# Patient Record
Sex: Male | Born: 1955 | Race: White | Hispanic: No | Marital: Married | Smoking: Current every day smoker
Health system: Southern US, Community
[De-identification: ages and names within clinical notes are randomized; demographics above are authoritative.]

## PROBLEM LIST (undated history)

## (undated) DIAGNOSIS — Z8719 Personal history of other diseases of the digestive system: Secondary | ICD-10-CM

## (undated) DIAGNOSIS — G2581 Restless legs syndrome: Secondary | ICD-10-CM

## (undated) DIAGNOSIS — I10 Essential (primary) hypertension: Secondary | ICD-10-CM

## (undated) DIAGNOSIS — I639 Cerebral infarction, unspecified: Secondary | ICD-10-CM

## (undated) DIAGNOSIS — R030 Elevated blood-pressure reading, without diagnosis of hypertension: Secondary | ICD-10-CM

## (undated) DIAGNOSIS — G8929 Other chronic pain: Principal | ICD-10-CM

## (undated) DIAGNOSIS — R072 Precordial pain: Secondary | ICD-10-CM

## (undated) DIAGNOSIS — E785 Hyperlipidemia, unspecified: Secondary | ICD-10-CM

## (undated) DIAGNOSIS — I6381 Other cerebral infarction due to occlusion or stenosis of small artery: Principal | ICD-10-CM

## (undated) DIAGNOSIS — B1920 Unspecified viral hepatitis C without hepatic coma: Secondary | ICD-10-CM

## (undated) DIAGNOSIS — Z800 Family history of malignant neoplasm of digestive organs: Principal | ICD-10-CM

## (undated) DIAGNOSIS — R29898 Other symptoms and signs involving the musculoskeletal system: Secondary | ICD-10-CM

## (undated) DIAGNOSIS — B182 Chronic viral hepatitis C: Principal | ICD-10-CM

## (undated) DIAGNOSIS — M79671 Pain in right foot: Secondary | ICD-10-CM

## (undated) DIAGNOSIS — R9389 Abnormal findings on diagnostic imaging of other specified body structures: Secondary | ICD-10-CM

## (undated) DIAGNOSIS — M25512 Pain in left shoulder: Secondary | ICD-10-CM

## (undated) DIAGNOSIS — M79672 Pain in left foot: Secondary | ICD-10-CM

## (undated) DIAGNOSIS — D509 Iron deficiency anemia, unspecified: Secondary | ICD-10-CM

## (undated) HISTORY — DX: Essential (primary) hypertension: I10

## (undated) HISTORY — DX: Elevated blood-pressure reading, without diagnosis of hypertension: R03.0

## (undated) HISTORY — PX: BACK SURGERY: SHX140

## (undated) HISTORY — PX: TONSILLECTOMY: SUR1361

---

## 1999-03-20 ENCOUNTER — Ambulatory Visit (HOSPITAL_COMMUNITY): Admission: RE | Admit: 1999-03-20 | Discharge: 1999-03-20 | Payer: Self-pay | Admitting: Gastroenterology

## 2000-12-27 ENCOUNTER — Ambulatory Visit (HOSPITAL_COMMUNITY): Admission: RE | Admit: 2000-12-27 | Discharge: 2000-12-27 | Payer: Self-pay | Admitting: Neurological Surgery

## 2000-12-27 ENCOUNTER — Encounter: Payer: Self-pay | Admitting: Neurological Surgery

## 2001-03-23 ENCOUNTER — Encounter: Admission: RE | Admit: 2001-03-23 | Discharge: 2001-03-23 | Payer: Self-pay | Admitting: *Deleted

## 2001-03-23 ENCOUNTER — Encounter: Payer: Self-pay | Admitting: *Deleted

## 2001-05-21 ENCOUNTER — Emergency Department: Admit: 2001-05-21 | Payer: Self-pay | Source: Emergency Department | Admitting: Emergency Medicine

## 2002-11-04 ENCOUNTER — Inpatient Hospital Stay
Admission: EM | Admit: 2002-11-04 | Disposition: A | Discharge status: Home / Self Care | Payer: Self-pay | Source: Emergency Department | Admitting: Internal Medicine

## 2003-08-06 ENCOUNTER — Emergency Department: Admit: 2003-08-06 | Payer: Self-pay | Source: Emergency Department | Admitting: Emergency Medicine

## 2003-09-26 ENCOUNTER — Emergency Department: Admit: 2003-09-26 | Payer: Self-pay | Source: Emergency Department | Admitting: Emergency Medicine

## 2003-11-24 ENCOUNTER — Encounter: Admission: RE | Admit: 2003-11-24 | Discharge: 2003-11-24 | Payer: Self-pay | Admitting: Family Medicine

## 2004-01-16 ENCOUNTER — Encounter: Admission: RE | Admit: 2004-01-16 | Discharge: 2004-01-16 | Payer: Self-pay | Admitting: Family Medicine

## 2004-01-16 ENCOUNTER — Ambulatory Visit (INDEPENDENT_AMBULATORY_CARE_PROVIDER_SITE_OTHER): Admit: 2004-01-16 | Disposition: A | Discharge status: Home / Self Care | Payer: Self-pay | Source: Ambulatory Visit

## 2004-06-16 ENCOUNTER — Encounter: Admission: RE | Admit: 2004-06-16 | Discharge: 2004-06-16 | Payer: Self-pay | Admitting: Family Medicine

## 2004-08-23 ENCOUNTER — Emergency Department: Admit: 2004-08-23 | Payer: Self-pay | Source: Emergency Department | Admitting: Emergency Medicine

## 2004-12-19 ENCOUNTER — Ambulatory Visit (HOSPITAL_BASED_OUTPATIENT_CLINIC_OR_DEPARTMENT_OTHER): Admission: RE | Admit: 2004-12-19 | Discharge: 2004-12-19 | Payer: Self-pay | Admitting: Family Medicine

## 2004-12-23 ENCOUNTER — Ambulatory Visit: Payer: Self-pay | Admitting: Internal Medicine

## 2005-01-27 ENCOUNTER — Ambulatory Visit (HOSPITAL_BASED_OUTPATIENT_CLINIC_OR_DEPARTMENT_OTHER): Admission: RE | Admit: 2005-01-27 | Discharge: 2005-01-27 | Payer: Self-pay | Admitting: Family Medicine

## 2005-02-04 ENCOUNTER — Ambulatory Visit: Payer: Self-pay | Admitting: Internal Medicine

## 2007-03-07 ENCOUNTER — Emergency Department: Admit: 2007-03-07 | Payer: Self-pay | Source: Emergency Department | Admitting: Pediatric Emergency Medicine

## 2007-07-31 ENCOUNTER — Ambulatory Visit (HOSPITAL_BASED_OUTPATIENT_CLINIC_OR_DEPARTMENT_OTHER): Admission: RE | Admit: 2007-07-31 | Discharge: 2007-07-31 | Payer: Self-pay | Admitting: Urology

## 2008-10-27 ENCOUNTER — Encounter: Admission: RE | Admit: 2008-10-27 | Discharge: 2008-10-27 | Payer: Self-pay | Admitting: Family Medicine

## 2009-06-07 ENCOUNTER — Emergency Department: Admit: 2009-06-07 | Payer: Self-pay | Source: Emergency Department | Admitting: Emergency Medicine

## 2009-06-12 ENCOUNTER — Emergency Department: Admit: 2009-06-12 | Payer: Self-pay | Source: Emergency Department

## 2009-06-21 ENCOUNTER — Encounter: Admission: RE | Admit: 2009-06-21 | Discharge: 2009-06-21 | Payer: Self-pay | Admitting: Family Medicine

## 2009-07-13 ENCOUNTER — Encounter
Admission: RE | Admit: 2009-07-13 | Discharge: 2009-09-13 | Payer: Self-pay | Admitting: Physical Medicine & Rehabilitation

## 2009-08-28 ENCOUNTER — Ambulatory Visit: Payer: Self-pay | Admitting: Physical Medicine & Rehabilitation

## 2009-10-06 ENCOUNTER — Encounter
Admission: RE | Admit: 2009-10-06 | Discharge: 2009-12-26 | Payer: Self-pay | Admitting: Physical Medicine & Rehabilitation

## 2009-10-10 ENCOUNTER — Ambulatory Visit: Payer: Self-pay | Admitting: Physical Medicine & Rehabilitation

## 2009-10-31 ENCOUNTER — Ambulatory Visit: Payer: Self-pay | Admitting: Physical Medicine & Rehabilitation

## 2009-12-26 ENCOUNTER — Ambulatory Visit: Payer: Self-pay | Admitting: Physical Medicine & Rehabilitation

## 2010-05-10 ENCOUNTER — Inpatient Hospital Stay (HOSPITAL_COMMUNITY): Admission: EM | Admit: 2010-05-10 | Discharge: 2009-07-12 | Payer: Self-pay | Admitting: Emergency Medicine

## 2010-08-23 LAB — BASIC METABOLIC PANEL
BUN: 13 mg/dL (ref 6–23)
CO2: 28 mEq/L (ref 19–32)
Calcium: 8.3 mg/dL — ABNORMAL LOW (ref 8.4–10.5)
Chloride: 105 mEq/L (ref 96–112)
Chloride: 106 mEq/L (ref 96–112)
Creatinine, Ser: 0.96 mg/dL (ref 0.4–1.5)
GFR calc Af Amer: >60 mL/min (ref >60)
GFR calc non Af Amer: >60 mL/min (ref >60)
Glucose, Bld: 144 mg/dL — ABNORMAL HIGH (ref 70–99)
Potassium: 3.7 mEq/L (ref 3.5–5.1)
Potassium: 3.8 mEq/L (ref 3.5–5.1)
Potassium: 4.1 mEq/L (ref 3.5–5.1)
Sodium: 139 mEq/L (ref 135–145)
Sodium: 139 mEq/L (ref 135–145)

## 2010-08-23 LAB — CBC
HCT: 41.1 % (ref 39.0–52.0)
HCT: 42.5 % (ref 39.0–52.0)
Hemoglobin: 14.2 g/dL (ref 13.0–17.0)
Hemoglobin: 14.7 g/dL (ref 13.0–17.0)
MCHC: 34.6 g/dL (ref 30.0–36.0)
MCV: 97 fL (ref 78.0–100.0)
Platelets: 169 10*3/uL (ref 150–400)
Platelets: 209 10*3/uL (ref 150–400)
RBC: 4.4 MIL/uL (ref 4.22–5.81)
RDW: 13 % (ref 11.5–15.5)
RDW: 13.3 % (ref 11.5–15.5)
WBC: 11 10*3/uL — ABNORMAL HIGH (ref 4.0–10.5)
WBC: 7.9 10*3/uL (ref 4.0–10.5)

## 2010-10-16 NOTE — Op Note (Signed)
NAMEBRECKEN, Henry Rangel                ACCOUNT NO.:  [id number]   MEDICAL RECORD NO.:  [id number]          PATIENT TYPE:  AMB   LOCATION:  [facility]                         FACILITY:  [facility]   PHYSICIAN:  Boston Service, M.D.DATE OF BIRTH:  02-06-1956   DATE OF PROCEDURE:  07/31/2007  DATE OF DISCHARGE:                               OPERATIVE REPORT   PREOPERATIVE DIAGNOSES:  A 55 year old smoker, total gross painless  hematuria.  Previous workup:  CT scan July 15, 2007. Reviewed office  note from that same date. Rectal exam nontender,  nonnodular, PSA  0.5.   POSTOPERATIVE DIAGNOSES:  Same.   PROCEDURE:  Cystoscopy and retrogrades.   ANESTHESIA:  General.   DRAINS:  None.   COMPLICATIONS:  None.   SURGEON:  Dr. Boston Service.   SPECIMENS:  None.   ESTIMATED BLOOD LOSS:  Minimal.   DESCRIPTION OF PROCEDURE:  The patient was prepped and draped in the  dorsal lithotomy position after institution of an adequate level of  general anesthesia. Narrow fossa navicularis dilated with Sissy Hoff  sounds to a #26-French, a  well lubricated 21-French panendoscope gently  inserted at the urethral meatus, normal urethra and sphincter,  nonobstructive prostate.  Careful inspection of the bladder showed clear  efflux at both right and left ureteral orifices.  An area of slight  bladder wall thickening on the CT scan was shown by cystoscopy to be  trabeculation but there was no evidence of tumor, stone, bleeding site  or other anatomic abnormality.  Careful inspection of the bladder with  the 15 and 70 degrees lenses was completed.   Blocking catheter was selected, retrogrades were performed.  Normal  course and caliber of the ureter, pelvis and calyces with prompt  drainage at 3-5 minutes.  The bladder was drained, cystoscope was  removed.  The patient was given a B&O suppository and returned to  recovery in satisfactory condition.   ADDENDUM:  The patient had been offered office  cystoscopy, but refused.           ______________________________  Boston Service, M.D.     RH/MEDQ  D:  07/31/2007  T:  08/01/2007  Job:  [id number]   cc:   Winn Jock. Lovenia Kim., MD  [place], [place]

## 2010-10-19 NOTE — Procedures (Signed)
[name], Henry Rangel                ACCOUNT NO.:  [id number]   MEDICAL RECORD NO.:  [id number]          PATIENT TYPE:  OUT   LOCATION:  SLEEP CENTER                 FACILITY:  [facility]   PHYSICIAN:  Clinton D. Maple Hudson, M.D. DATE OF BIRTH:  1955-10-17   DATE OF STUDY:  12/19/2004                              NOCTURNAL POLYSOMNOGRAM   REFERRING PHYSICIAN:  Talmadge Coventry, M.D.   INDICATION FOR STUDY:  Hypersomnia with sleep apnea.   EPWORTH SLEEPINESS SCORE:  4/24   BMI:  34.9   WEIGHT:  258 pounds   SLEEP ARCHITECTURE:  Total sleep time 289 minutes.  Sleep efficiency 87%.  Stage I 7%, stage II 72%, stages III and IV 10 %.  REM was 12% of total  sleep time.  Sleep latency 33 minutes.  REM latency 80 minutes, awake after  sleep onset 47 minutes.  Arousal index 35.  No bedtime medication reported.   RESPIRATORY DATA:  Respiratory disturbance index  (RDI, AHI) 12.9  obstructive events per hour, indicating mild obstructive sleep  apnea/hypopnea syndrome.  There was one obstructive apnea and 61 hypopneas.  Most events were recorded while sleeping supine.  REM RDI 3.6.  He had  insufficient early events to prevent use of CPAP titration by split protocol  on the study night.   OXYGEN DATA:  Moderate snoring with oxygen desaturation to a nadir of 87%.  Mean oxygen saturation to the study was 93% on room air.   CARDIAC DATA:  Sinus rhythm and sinus bradycardia 58-78 per minute without  ectopics.   MOVEMENT/PARASOMNIA:  A total of 172 limb jerks were reported, of which 17  were associated with arousal or awakening for a periodic limb movement with  arousal index of 3.5 per hour, which is mildly increased.   IMPRESSION/RECOMMENDATION:  1.  Mild obstructive sleep apnea/hypopnea syndrome, respiratory disturbance      index  12.9 per hour with moderate snoring and oxygen desaturation to      87%.  2.  Scores in this range may qualify for CPAP titration, but there is      documentation of  symptoms and/or      complications of sleep apnea, including excessive daytime somnolence or      hypertension.  3.  Periodic limb movements with arousal, 3.5 per hour.      Clinton D. Maple Hudson, M.D.  Diplomat    CDY/MEDQ  D:  12/23/2004 12:33:23  T:  12/24/2004 08:16:31  Job:  [id number]

## 2010-10-19 NOTE — Procedures (Signed)
NAMEMEDARD, DECUIR                ACCOUNT NO.:  [id number]   MEDICAL RECORD NO.:  [id number]          PATIENT TYPE:  OUT   LOCATION:  SLEEP CENTER                 FACILITY:  [facility]   PHYSICIAN:  Clinton D. Maple Hudson, M.D. DATE OF BIRTH:  Apr 16, 1956   DATE OF STUDY:  01/27/2005                              NOCTURNAL POLYSOMNOGRAM   REFERRING PHYSICIAN:  Dr. Rise Mu Mazzocchi   DATE OF STUDY:  January 27, 2005   INDICATION FOR STUDY:  Hypersomnia with sleep apnea. Epworth Sleepiness  Score 5/24, BMI 35, weight 258 pounds. A baseline diagnostic NPSG December 19, 2004 recorded an RDI of 12.9 per hour. CPAP titration is requested.   SLEEP ARCHITECTURE:  Very short total sleep time 194 minutes with sleep  efficiency 54%. Stage I was 20%, stage II 67%, stage III and IV 9%, REM 4%  of total sleep time. Sleep latency 19 minutes, REM latency 214 minutes,  awake after sleep onset 108 minutes, arousal index increased at 55. No  bedtime medication recorded except for blood pressure pill.   RESPIRATORY DATA:  CPAP titration protocol. CPAP was titrated to 5 CWP. The  technician worked with the patient, trying a number of different masks and  finally switched to BiPAP at a final pressure of inspiratory 9/expiratory 6  CWP. It appears the final appliance was a [employer] with medium nasal  pillows and heated humidifier.   OXYGEN DATA:  Snoring was minimal at final pressure with oxygenation 95-98%  on room air.   CARDIAC DATA:  Normal sinus rhythm.   MOVEMENT/PARASOMNIA:  A total of 318 limb jerks were recorded of which 120  were associated with arousal or awakening for a periodic limb movement with  arousal index of 37 per hour which is increased.   IMPRESSION/RECOMMENDATION:  1.  Difficult titration because of patient discomfort with masks. Final      settings were with BiPAP inspiratory 9/expiratory 6 CWP using a ResMed      Swift with medium nasal pillows and heated humidifier.  2.  This  patient will probably benefit from a sleep medication during      initial adjustment at home. Once comfortable with positive pressure he      may find he can switch satisfactorily back to continuous positive airway      pressure at a suggested initial pressure of 9 CWP.  3.  Periodic limb movement with arousal, 37 per hour. This is significant      and may represent an additional sleep disorder requiring specific      therapy but it can be reconsidered after treatment for sleep apnea.      Clinton D. Maple Hudson, M.D.  Diplomate, Biomedical engineer of Sleep Medicine  Electronically Signed     CDY/MEDQ  D:  02/03/2005 11:35:36  T:  02/03/2005 14:15:05  Job:  [id number]

## 2011-02-22 LAB — POCT I-STAT, CHEM 8
BUN: 12
Chloride: 105
Creatinine, Ser: 0.9
Glucose, Bld: 101 — ABNORMAL HIGH
Hemoglobin: 16.7
Potassium: 4.1
TCO2: 27

## 2011-03-12 ENCOUNTER — Ambulatory Visit: Payer: Worker's Compensation | Admitting: Physical Medicine & Rehabilitation

## 2011-03-12 ENCOUNTER — Encounter: Payer: Worker's Compensation | Attending: Physical Medicine & Rehabilitation

## 2011-03-12 DIAGNOSIS — M549 Dorsalgia, unspecified: Secondary | ICD-10-CM | POA: Insufficient documentation

## 2011-03-12 DIAGNOSIS — R209 Unspecified disturbances of skin sensation: Secondary | ICD-10-CM | POA: Insufficient documentation

## 2011-03-12 DIAGNOSIS — E1149 Type 2 diabetes mellitus with other diabetic neurological complication: Secondary | ICD-10-CM | POA: Insufficient documentation

## 2011-03-12 DIAGNOSIS — M51379 Other intervertebral disc degeneration, lumbosacral region without mention of lumbar back pain or lower extremity pain: Secondary | ICD-10-CM | POA: Insufficient documentation

## 2011-03-12 DIAGNOSIS — E1142 Type 2 diabetes mellitus with diabetic polyneuropathy: Secondary | ICD-10-CM

## 2011-03-12 DIAGNOSIS — M48061 Spinal stenosis, lumbar region without neurogenic claudication: Secondary | ICD-10-CM

## 2011-03-12 DIAGNOSIS — M5137 Other intervertebral disc degeneration, lumbosacral region: Secondary | ICD-10-CM | POA: Insufficient documentation

## 2011-03-13 LAB — ECG 12-LEAD
Atrial Rate: 90 {beats}/min
P Axis: 82 degrees
P-R Interval: 138 ms
Q-T Interval: 354 ms
QRS Duration: 74 ms
QTC Calculation (Bezet): 433 ms
R Axis: 257 degrees
T Axis: 39 degrees
Ventricular Rate: 90 {beats}/min

## 2011-06-28 ENCOUNTER — Encounter (HOSPITAL_COMMUNITY): Payer: Self-pay | Admitting: Emergency Medicine

## 2011-06-28 ENCOUNTER — Emergency Department (HOSPITAL_COMMUNITY): Payer: Medicare Other

## 2011-06-28 ENCOUNTER — Emergency Department (HOSPITAL_COMMUNITY)
Admission: EM | Admit: 2011-06-28 | Discharge: 2011-06-28 | Disposition: A | Discharge status: Home / Self Care | Payer: Medicare Other | Attending: Emergency Medicine | Admitting: Emergency Medicine

## 2011-06-28 DIAGNOSIS — R079 Chest pain, unspecified: Secondary | ICD-10-CM | POA: Insufficient documentation

## 2011-06-28 DIAGNOSIS — R05 Cough: Secondary | ICD-10-CM | POA: Insufficient documentation

## 2011-06-28 DIAGNOSIS — Z79899 Other long term (current) drug therapy: Secondary | ICD-10-CM | POA: Insufficient documentation

## 2011-06-28 DIAGNOSIS — R059 Cough, unspecified: Secondary | ICD-10-CM | POA: Insufficient documentation

## 2011-06-28 DIAGNOSIS — I10 Essential (primary) hypertension: Secondary | ICD-10-CM | POA: Insufficient documentation

## 2011-06-28 DIAGNOSIS — IMO0002 Reserved for concepts with insufficient information to code with codable children: Secondary | ICD-10-CM | POA: Insufficient documentation

## 2011-06-28 DIAGNOSIS — S29011A Strain of muscle and tendon of front wall of thorax, initial encounter: Secondary | ICD-10-CM

## 2011-06-28 DIAGNOSIS — X500XXA Overexertion from strenuous movement or load, initial encounter: Secondary | ICD-10-CM | POA: Insufficient documentation

## 2011-06-28 DIAGNOSIS — R0602 Shortness of breath: Secondary | ICD-10-CM | POA: Insufficient documentation

## 2011-06-28 DIAGNOSIS — F17200 Nicotine dependence, unspecified, uncomplicated: Secondary | ICD-10-CM | POA: Insufficient documentation

## 2011-06-28 DIAGNOSIS — R109 Unspecified abdominal pain: Secondary | ICD-10-CM | POA: Insufficient documentation

## 2011-06-28 DIAGNOSIS — Z7982 Long term (current) use of aspirin: Secondary | ICD-10-CM | POA: Insufficient documentation

## 2011-06-28 HISTORY — DX: Restless legs syndrome: G25.81

## 2011-06-28 HISTORY — DX: Essential (primary) hypertension: I10

## 2011-06-28 MED ORDER — OXYCODONE HCL 5 MG PO TABS: 5.0000 mg | ORAL_TABLET | ORAL | Status: AC | PRN

## 2011-06-28 MED ORDER — HYDROCOD POLST-CHLORPHEN POLST 10-8 MG/5ML PO LQCR: 5.0000 mL | Freq: Two times a day (BID) | ORAL | Status: DC

## 2011-06-28 MED ORDER — HYDROCOD POLST-CHLORPHEN POLST 10-8 MG/5ML PO LQCR
5.0000 mL | Freq: Once | ORAL | Status: AC
  Administered 2011-06-28: 5 mL via ORAL
  Filled 2011-06-28: qty 5

## 2011-06-28 MED ORDER — OXYCODONE-ACETAMINOPHEN 5-325 MG PO TABS
2.0000 | ORAL_TABLET | Freq: Once | ORAL | Status: AC
  Administered 2011-06-28: 2 via ORAL
  Filled 2011-06-28: qty 2

## 2011-06-28 NOTE — ED Provider Notes (Signed)
Medical screening examination/treatment/procedure(s) were performed by non-physician practitioner and as supervising physician I was immediately available for consultation/collaboration.   Vida Roller, MD 06/28/11 [id number]

## 2011-06-28 NOTE — ED Notes (Signed)
Pt alert, nad, c/o right rib pain, onset this evening, pt denies trauma or injury, states pain started after coughing, resp even, shallow,  unlabored, skin pwd.

## 2011-06-28 NOTE — ED Provider Notes (Signed)
History     CSN: [id number]  Arrival date & time 06/28/11  0107   First MD Initiated Contact with Patient 06/28/11 0119      Chief Complaint  Patient presents with  . Rib Injury    Right    (Consider location/radiation/quality/duration/timing/severity/associated sxs/prior treatment) HPI Comments: Patient has been seeing his primary care physician for cough was told that he had bronchitis and maybe a touch of pneumonia.  Was given a Z-Pak steroids, which he has completed.  He is still coughing, nonproductive.  No fever.  Tonight, when coughing, he developed severe sudden, sharp, lower right chest pain that now hurts with breathing or movement  The history is provided by the patient.    Past Medical History  Diagnosis Date  . Hypertension   . Restless leg syndrome     Past Surgical History  Procedure Date  . Back surgery     No family history on file.  History  Substance Use Topics  . Smoking status: Current Everyday Smoker -- 1.0 packs/day  . Smokeless tobacco: Not on file  . Alcohol Use:       Review of Systems  Constitutional: Negative for fever and chills.  HENT: Negative for congestion and rhinorrhea.   Respiratory: Positive for cough. Negative for shortness of breath and wheezing.   Cardiovascular: Positive for chest pain.  Gastrointestinal: Negative for nausea.  Skin: Negative.   Neurological: Negative for dizziness and weakness.    Allergies  Penicillins  Home Medications   Current Outpatient Rx  Name Route Sig Dispense Refill  . ASPIRIN 81 MG PO CHEW Oral Chew 81 mg by mouth daily.    Marland Kitchen BISOPROLOL-HYDROCHLOROTHIAZIDE 2.5-6.25 MG PO TABS Oral Take 1 tablet by mouth daily.    . OMEGA-3-ACID ETHYL ESTERS 1 G PO CAPS Oral Take 2 g by mouth daily.    Marland Kitchen PRAMIPEXOLE DIHYDROCHLORIDE 1.5 MG PO TABS Oral Take 1.5 mg by mouth 3 (three) times daily.      BP 156/78  Pulse 87  Temp 98 F (36.7 C)  Resp 16  Wt 285 lb (129.275 kg)  SpO2 99%  Physical  Exam  Constitutional: He is oriented to person, place, and time. He appears well-developed and well-nourished.  HENT:  Head: Normocephalic.  Eyes: Pupils are equal, round, and reactive to light.  Neck: Normal range of motion.  Cardiovascular: Normal rate and regular rhythm.   Pulmonary/Chest: Effort normal and breath sounds normal. He has no wheezes. He has no rales. He exhibits tenderness.  Abdominal: Soft. Bowel sounds are normal. He exhibits no distension. There is tenderness.  Musculoskeletal: Normal range of motion.  Neurological: He is alert and oriented to person, place, and time.  Skin: Skin is warm and dry.  Psychiatric: He has a normal mood and affect.    ED Course  Procedures (including critical care time)  Labs Reviewed - No data to display Dg Ribs Unilateral W/chest Right  06/28/2011  *RADIOLOGY REPORT*  Clinical Data: Cough; heard pop in chest, with right anterior lower rib pain and shortness of breath.  RIGHT RIBS AND CHEST - 3+ VIEW  Comparison: Chest radiograph performed 11/24/2003  Findings: No displaced rib fractures are seen.  The lungs are relatively well expanded.  Minimal left basilar opacity likely reflects atelectasis.  No pleural effusion or pneumothorax is seen.  The cardiomediastinal silhouette is borderline normal in size.  No acute osseous abnormalities identified.  IMPRESSION:  1.  No displaced rib fractures seen. 2.  Minimal  left basilar airspace opacity likely reflects atelectasis; lungs otherwise clear.  Original Report Authenticated By: Tonia Ghent, M.D.     1. Chest wall muscle strain     Pleuritic, right-sided chest pain with cough.  Will obtain x-ray to rule out rib fracture.  While in the emergency, department, we'll administer Percocet for pain.  Monitor patient Discussed chest x-ray findings with patient and family will administer Tussionex for cough.  Discharge patient home with Tussionex hydrocodone and have patient follow up with their primary  care physician as needed  MDM  Pleuritic chest pain        Arman Filter, NP 06/28/11 0300

## 2011-07-17 ENCOUNTER — Other Ambulatory Visit: Payer: Self-pay | Admitting: Family Medicine

## 2011-07-18 ENCOUNTER — Ambulatory Visit
Admission: RE | Admit: 2011-07-18 | Discharge: 2011-07-18 | Disposition: A | Discharge status: Home / Self Care | Payer: 59 | Source: Ambulatory Visit | Attending: Family Medicine | Admitting: Family Medicine

## 2011-07-18 MED ORDER — IOHEXOL 300 MG/ML  SOLN
125.0000 mL | Freq: Once | INTRAMUSCULAR | Status: AC | PRN
  Administered 2011-07-18: 125 mL via INTRAVENOUS

## 2011-07-19 ENCOUNTER — Other Ambulatory Visit: Payer: Self-pay

## 2011-07-19 ENCOUNTER — Other Ambulatory Visit: Payer: Self-pay | Admitting: Family Medicine

## 2011-07-19 DIAGNOSIS — F17200 Nicotine dependence, unspecified, uncomplicated: Secondary | ICD-10-CM

## 2011-07-22 ENCOUNTER — Ambulatory Visit
Admission: RE | Admit: 2011-07-22 | Discharge: 2011-07-22 | Disposition: A | Discharge status: Home / Self Care | Payer: 59 | Source: Ambulatory Visit | Attending: Family Medicine | Admitting: Family Medicine

## 2011-07-22 ENCOUNTER — Other Ambulatory Visit: Payer: Self-pay | Admitting: Family Medicine

## 2011-07-22 DIAGNOSIS — M79606 Pain in leg, unspecified: Secondary | ICD-10-CM

## 2011-07-22 DIAGNOSIS — F17200 Nicotine dependence, unspecified, uncomplicated: Secondary | ICD-10-CM

## 2011-07-22 MED ORDER — IOHEXOL 300 MG/ML  SOLN
75.0000 mL | Freq: Once | INTRAMUSCULAR | Status: AC | PRN
  Administered 2011-07-22: 75 mL via INTRAVENOUS

## 2011-07-23 ENCOUNTER — Ambulatory Visit
Admission: RE | Admit: 2011-07-23 | Discharge: 2011-07-23 | Disposition: A | Discharge status: Home / Self Care | Payer: 59 | Source: Ambulatory Visit | Attending: Family Medicine | Admitting: Family Medicine

## 2011-07-23 DIAGNOSIS — M79606 Pain in leg, unspecified: Secondary | ICD-10-CM

## 2011-08-02 ENCOUNTER — Other Ambulatory Visit: Payer: Self-pay | Admitting: Family Medicine

## 2011-08-02 ENCOUNTER — Ambulatory Visit
Admission: RE | Admit: 2011-08-02 | Discharge: 2011-08-02 | Disposition: A | Discharge status: Home / Self Care | Payer: 59 | Source: Ambulatory Visit | Attending: Family Medicine | Admitting: Family Medicine

## 2011-08-02 DIAGNOSIS — M545 Low back pain: Secondary | ICD-10-CM

## 2011-08-02 MED ORDER — GADOBENATE DIMEGLUMINE 529 MG/ML IV SOLN: 20.0000 mL | Freq: Once | INTRAVENOUS | Status: AC | PRN

## 2011-08-08 ENCOUNTER — Other Ambulatory Visit (HOSPITAL_COMMUNITY): Payer: Self-pay | Admitting: Family Medicine

## 2011-08-08 DIAGNOSIS — K769 Liver disease, unspecified: Secondary | ICD-10-CM

## 2011-08-09 ENCOUNTER — Other Ambulatory Visit: Payer: Self-pay | Admitting: Radiology

## 2011-08-09 ENCOUNTER — Encounter (HOSPITAL_COMMUNITY): Payer: Self-pay | Admitting: Pharmacy Technician

## 2011-08-13 ENCOUNTER — Other Ambulatory Visit (HOSPITAL_COMMUNITY): Payer: 59

## 2011-08-13 ENCOUNTER — Other Ambulatory Visit: Payer: Self-pay | Admitting: Physician Assistant

## 2011-08-13 ENCOUNTER — Emergency Department
Admit: 2011-08-13 | Discharge: 2011-08-13 | Disposition: A | Discharge status: Home / Self Care | Payer: Self-pay | Source: Emergency Department | Admitting: Emergency Medicine

## 2011-08-15 ENCOUNTER — Inpatient Hospital Stay (HOSPITAL_COMMUNITY): Admission: RE | Admit: 2011-08-15 | Payer: 59 | Source: Ambulatory Visit

## 2011-08-15 LAB — ECG 12-LEAD
Atrial Rate: 100 {beats}/min
Atrial Rate: 61 {beats}/min
P Axis: 84 degrees
P-R Interval: 138 ms
Q-T Interval: 224 ms
Q-T Interval: 342 ms
QRS Duration: 78 ms
QRS Duration: 80 ms
QTC Calculation (Bezet): 417 ms
QTC Calculation (Bezet): 441 ms
R Axis: -78 degrees
R Axis: 74 degrees
T Axis: 18 degrees
T Axis: 9 degrees
Ventricular Rate: 100 {beats}/min
Ventricular Rate: 209 {beats}/min

## 2011-08-16 ENCOUNTER — Encounter (HOSPITAL_COMMUNITY): Payer: Self-pay

## 2011-08-16 ENCOUNTER — Other Ambulatory Visit (HOSPITAL_COMMUNITY): Payer: Self-pay | Admitting: Family Medicine

## 2011-08-16 ENCOUNTER — Ambulatory Visit (HOSPITAL_COMMUNITY)
Admission: RE | Admit: 2011-08-16 | Discharge: 2011-08-16 | Disposition: A | Discharge status: Home / Self Care | Payer: Medicare Other | Source: Ambulatory Visit | Attending: Family Medicine | Admitting: Family Medicine

## 2011-08-16 DIAGNOSIS — J449 Chronic obstructive pulmonary disease, unspecified: Secondary | ICD-10-CM | POA: Insufficient documentation

## 2011-08-16 DIAGNOSIS — R7309 Other abnormal glucose: Secondary | ICD-10-CM | POA: Insufficient documentation

## 2011-08-16 DIAGNOSIS — I10 Essential (primary) hypertension: Secondary | ICD-10-CM | POA: Insufficient documentation

## 2011-08-16 DIAGNOSIS — Z79899 Other long term (current) drug therapy: Secondary | ICD-10-CM | POA: Insufficient documentation

## 2011-08-16 DIAGNOSIS — G4733 Obstructive sleep apnea (adult) (pediatric): Secondary | ICD-10-CM | POA: Insufficient documentation

## 2011-08-16 DIAGNOSIS — K7689 Other specified diseases of liver: Secondary | ICD-10-CM | POA: Insufficient documentation

## 2011-08-16 DIAGNOSIS — Z85828 Personal history of other malignant neoplasm of skin: Secondary | ICD-10-CM | POA: Insufficient documentation

## 2011-08-16 DIAGNOSIS — K769 Liver disease, unspecified: Secondary | ICD-10-CM

## 2011-08-16 DIAGNOSIS — J4489 Other specified chronic obstructive pulmonary disease: Secondary | ICD-10-CM | POA: Insufficient documentation

## 2011-08-16 DIAGNOSIS — G2581 Restless legs syndrome: Secondary | ICD-10-CM | POA: Insufficient documentation

## 2011-08-16 HISTORY — DX: Personal history of other diseases of the digestive system: Z87.19

## 2011-08-16 LAB — CBC
MCH: 32.9 pg (ref 26.0–34.0)
Platelets: 186 10*3/uL (ref 150–400)
RBC: 4.8 MIL/uL (ref 4.22–5.81)
RDW: 13.3 % (ref 11.5–15.5)

## 2011-08-16 LAB — PROTIME-INR: Prothrombin Time: 13 seconds (ref 11.6–15.2)

## 2011-08-16 MED ORDER — FENTANYL CITRATE 0.05 MG/ML IJ SOLN
INTRAMUSCULAR | Status: AC | PRN
  Administered 2011-08-16 (×2): 100 ug via INTRAVENOUS

## 2011-08-16 MED ORDER — MIDAZOLAM HCL 2 MG/2ML IJ SOLN
INTRAMUSCULAR | Status: AC
  Filled 2011-08-16: qty 2

## 2011-08-16 MED ORDER — MIDAZOLAM HCL 5 MG/5ML IJ SOLN
INTRAMUSCULAR | Status: AC | PRN
  Administered 2011-08-16 (×2): 2 mg via INTRAVENOUS

## 2011-08-16 MED ORDER — FENTANYL CITRATE 0.05 MG/ML IJ SOLN
INTRAMUSCULAR | Status: AC
  Filled 2011-08-16: qty 4

## 2011-08-16 MED ORDER — MIDAZOLAM HCL 2 MG/2ML IJ SOLN
INTRAMUSCULAR | Status: AC
  Filled 2011-08-16: qty 4

## 2011-08-16 MED ORDER — FENTANYL CITRATE 0.05 MG/ML IJ SOLN
INTRAMUSCULAR | Status: DC | PRN
  Administered 2011-08-16: 100 ug via INTRAVENOUS

## 2011-08-16 MED ORDER — HYDROCODONE-ACETAMINOPHEN 10-325 MG PO TABS: 1.0000 | ORAL_TABLET | ORAL | Status: DC | PRN

## 2011-08-16 MED ORDER — SODIUM CHLORIDE 0.9 % IV SOLN
Freq: Once | INTRAVENOUS | Status: AC
  Administered 2011-08-16: 13:00:00 via INTRAVENOUS

## 2011-08-16 NOTE — H&P (Signed)
Agree 

## 2011-08-16 NOTE — Procedures (Signed)
Procedure:  CT guided liver biopsy Findings:  Multiple lesions in liver not visualized at all by ultrasound.  Visible by unenhanced CT.  Core bx of right lobe liver lesion performed under CT guidance.  18 G core samples x 3 via 17 G needle.  No immediate complications Plan:  3 hour recovery/bedrest

## 2011-08-16 NOTE — Discharge Instructions (Signed)
Liver Biopsy Care After Refer to this sheet in the next few weeks. These discharge instructions provide you with general information on caring for yourself after you leave the hospital. Your caregiver may also give you specific instructions. Your treatment has been planned according to the most current medical practices available, but unavoidable complications sometimes occur. If you have any problems or questions after discharge, please call your caregiver. HOME CARE INSTRUCTIONS   You should rest for 1 to 2 days or as instructed.   If you go home the same day as your procedure (outpatient), have a responsible adult take you home and stay with you overnight.   Do not lift more than 5 pounds or play contact sports for 2 weeks.   Do not drive for 24 hours after this test.   Do not take medicine containing aspirin or drink alcohol for 1 week after this test.   Change bandages (dressings) as directed.   Only take over-the-counter or prescription medicines for pain, discomfort, or fever as directed by your caregiver.  OBTAINING YOUR TEST RESULTS Not all test results are available during your visit. If your test results are not back during the visit, make an appointment with your caregiver to find out the results. Do not assume everything is normal if you have not heard from your caregiver or the medical facility. It is important for you to follow up on all of your test results. SEEK MEDICAL CARE IF:   You have increased bleeding (more than a small spot) from the biopsy site.   You have redness, swelling, or increasing pain in the biopsy site.   You have an oral temperature above 102 F (38.9 C).  SEEK IMMEDIATE MEDICAL CARE IF:   You develop swelling or pain in the belly (abdomen).   You develop a rash.   You have difficulty breathing, feel short of breath, or feel faint.   You develop any reaction or side effects to medicines given.  MAKE SURE YOU:   Understand these instructions.    Will watch your condition.   Will get help right away if you are not doing well or get worse.  Document Released: 12/07/2004 Document Revised: 05/09/2011 Document Reviewed: 12/31/2007 ExitCare Patient Information 2012 [employer]. 

## 2011-08-16 NOTE — H&P (Signed)
Henry BAYLESS Sr. is an 56 y.o. male.   Chief Complaint: liver lesions HPI: Patient with recent history of RLQ/right groin pain and follow up CT scan revealing multiple liver lesions presents today for elective US guided liver lesion biopsy.  Past Medical History  Diagnosis Date  . Hypertension   . Restless leg syndrome   OSA,asthma/COPD, skin cancer, borderline DM  Past Surgical History  Procedure Date  . Back surgery   tonsillectomy  No family history on file. Social History:  reports that he has been smoking.  He does not have any smokeless tobacco history on file. His alcohol and drug histories not on file.  Allergies:  Allergies  Allergen Reactions  . Penicillins Other (See Comments)    Had when was little    Medications Prior to Admission  Medication Sig Dispense Refill  . albuterol (PROVENTIL HFA;VENTOLIN HFA) 108 (90 BASE) MCG/ACT inhaler Inhale 2 puffs into the lungs every 6 (six) hours as needed. Wheezing      . aspirin 81 MG chewable tablet Chew 81 mg by mouth every morning.       . bisoprolol-hydrochlorothiazide (ZIAC) 2.5-6.25 MG per tablet Take 1 tablet by mouth every morning.       . chlorpheniramine-HYDROcodone (TUSSIONEX) 10-8 MG/5ML LQCR Take 5 mLs by mouth every 12 (twelve) hours as needed. Cough      . omega-3 acid ethyl esters (LOVAZA) 1 G capsule Take 2 g by mouth daily.      . pramipexole (MIRAPEX) 1.5 MG tablet Take 1.5 mg by mouth 3 (three) times daily.       Medications Prior to Admission  Medication Dose Route Frequency Provider Last Rate Last Dose  . 0.9 %  sodium chloride infusion   Intravenous Once Robet Leu, PA        Results for orders placed during the hospital encounter of 08/16/11  CBC      Component Value Range   WBC 8.3  4.0 - 10.5 (K/uL)   RBC 4.80  4.22 - 5.81 (MIL/uL)   Hemoglobin 15.8  13.0 - 17.0 (g/dL)   HCT 16.1  09.6 - 04.5 (%)   MCV 95.0  78.0 - 100.0 (fL)   MCH 32.9  26.0 - 34.0 (pg)   MCHC 34.6  30.0 - 36.0 (g/dL)     RDW 40.9  81.1 - 91.4 (%)   Platelets 186  150 - 400 (K/uL)   Results for orders placed during the hospital encounter of 08/16/11  APTT      Component Value Range   aPTT 24  24 - 37 (seconds)  CBC      Component Value Range   WBC 8.3  4.0 - 10.5 (K/uL)   RBC 4.80  4.22 - 5.81 (MIL/uL)   Hemoglobin 15.8  13.0 - 17.0 (g/dL)   HCT 78.2  95.6 - 21.3 (%)   MCV 95.0  78.0 - 100.0 (fL)   MCH 32.9  26.0 - 34.0 (pg)   MCHC 34.6  30.0 - 36.0 (g/dL)   RDW 08.6  57.8 - 46.9 (%)   Platelets 186  150 - 400 (K/uL)  PROTIME-INR      Component Value Range   Prothrombin Time 13.0  11.6 - 15.2 (seconds)   INR 0.96  0.00 - 1.49      Review of Systems  Constitutional: Negative for fever and chills.  Respiratory: Positive for shortness of breath.   Cardiovascular: Negative for chest pain.  Gastrointestinal: Positive for  abdominal pain. Negative for nausea and vomiting.       Intermittent RLQ/hip pain  Neurological: Negative for headaches.  Endo/Heme/Allergies: Does not bruise/bleed easily.    Blood pressure 160/95, pulse 80, temperature 98.3 F (36.8 C), temperature source Oral, resp. rate 18, height 5\' 11"  (1.803 m), weight 282 lb (127.914 kg), SpO2 96.00%. Physical Exam  Constitutional: He is oriented to person, place, and time. He appears well-developed and well-nourished.  Cardiovascular: Normal rate and regular rhythm.   Respiratory:       Distant BS  GI: Soft. Bowel sounds are normal.       Mild RLQ/ hip discomfort  Musculoskeletal: Normal range of motion. He exhibits edema.  Neurological: He is alert and oriented to person, place, and time.     Assessment/Plan Patient with history of abdominal pain and multiple liver lesions noted on recent CT; plan is for US guided liver lesion biopsy.  Cielle Aguila,D KEVIN 08/16/2011, 12:31 PM

## 2012-12-16 ENCOUNTER — Emergency Department: Payer: Medicaid Other

## 2012-12-16 ENCOUNTER — Emergency Department
Admission: EM | Admit: 2012-12-16 | Discharge: 2012-12-16 | Disposition: A | Discharge status: Home / Self Care | Payer: Medicaid Other | Attending: Emergency Medicine | Admitting: Emergency Medicine

## 2012-12-16 DIAGNOSIS — W230XXA Caught, crushed, jammed, or pinched between moving objects, initial encounter: Secondary | ICD-10-CM | POA: Insufficient documentation

## 2012-12-16 DIAGNOSIS — IMO0002 Reserved for concepts with insufficient information to code with codable children: Secondary | ICD-10-CM | POA: Insufficient documentation

## 2012-12-16 DIAGNOSIS — S62609A Fracture of unspecified phalanx of unspecified finger, initial encounter for closed fracture: Secondary | ICD-10-CM | POA: Insufficient documentation

## 2012-12-16 MED ORDER — IBUPROFEN 600 MG PO TABS
600.0000 mg | ORAL_TABLET | Freq: Four times a day (QID) | ORAL | Status: DC | PRN
Start: 2012-12-16 — End: 2013-04-15

## 2012-12-16 MED ORDER — OXYCODONE-ACETAMINOPHEN 5-325 MG PO TABS
1.0000 | ORAL_TABLET | Freq: Once | ORAL | Status: AC
Start: 2012-12-16 — End: 2012-12-16
  Administered 2012-12-16: 1 via ORAL
  Filled 2012-12-16: qty 1

## 2012-12-16 MED ORDER — HYDROCODONE-ACETAMINOPHEN 5-325 MG PO TABS
1.0000 | ORAL_TABLET | Freq: Four times a day (QID) | ORAL | Status: AC | PRN
Start: 2012-12-16 — End: 2012-12-26

## 2012-12-16 MED ORDER — IBUPROFEN 600 MG PO TABS
600.0000 mg | ORAL_TABLET | Freq: Once | ORAL | Status: AC
Start: 2012-12-16 — End: 2012-12-16
  Administered 2012-12-16: 600 mg via ORAL
  Filled 2012-12-16: qty 1

## 2012-12-16 MED ORDER — DIAZEPAM 5 MG PO TABS
5.0000 mg | ORAL_TABLET | Freq: Once | ORAL | Status: AC
Start: 2012-12-16 — End: 2012-12-16
  Administered 2012-12-16: 5 mg via ORAL
  Filled 2012-12-16: qty 1

## 2012-12-16 MED ORDER — CYCLOBENZAPRINE HCL 10 MG PO TABS
10.0000 mg | ORAL_TABLET | Freq: Three times a day (TID) | ORAL | Status: AC | PRN
Start: 2012-12-16 — End: 2012-12-31

## 2012-12-16 NOTE — Discharge Instructions (Signed)
Back Pain NOS    You have been seen for back pain.    Back pain can happen anywhere from the neck down to the low back. Back pain has many different causes. Some of the more common are: Bone pain, muscle strain, muscle spasm, pain from overuse, and pinched nerves. Other problems can cause what feels like back pain. But the pain is really coming from another organ. This could be a kidney infection. This can cause lower back pain.    Your doctor did not find any pain over the bones in your back (even though you might have pain in the muscles of the back). This means it is very unlikely that you have a broken bone (fracture) in your back. Your doctor did not think it was necessary to take an x-ray.    The doctor still does not know the exact cause of your pain. Your problem does not seem to be from a dangerous cause. It is safe for you to go home today.    Some things you can try to help your back feel better are:   Apply a warm damp washcloth to the back where you have pain for 20 minutes at a time, at least 4 times per day.    Have someone massage the sore parts of your back.    Don t do any heavy lifting or bending. You can go back to normal daily activities if they don t make the pain worse.   You can use anti-inflammatory pain medicine for your pain. This could be Ibuprofen (Advil or Motrin). You can buy these at most stores. Follow the directions on the package.    This pain may last for the next few days. If your pain gets better, you probably do not need to see a doctor. However, if your symptoms get worse or you have new symptoms, you should return here or go to the nearest Emergency Department.    Call your physician or go to the nearest Emergency Department if you your pain does not improve within 4 weeks or your pain is bad enough to seriously limit your normal activities.    YOU SHOULD SEEK MEDICAL ATTENTION IMMEDIATELY, EITHER HERE OR AT THE NEAREST EMERGENCY DEPARTMENT, IF ANY OF THE  FOLLOWING OCCURS:   - You think the pain is coming from somewhere other than your back. This can include chest pain. This is sometimes from angina (heart pains) or other dangerous causes.   You have shortness of breath, sweating, chest pain (or pressure, heaviness, indigestion, etc).   You have abdominal (belly) pain that goes through to your back.   Your arms and legs tingle or get numb (lose feeling).   Your arms or legs are weak.   You lose control of your bladder or bowels. If this were to happen, it may cause you to wet or soil yourself.    You have problems urinating (peeing).   You have fevers (a temperature of more than 100.100F or 38C).   Your pain gets worse.      Phalanx Fracture, Finger    You have been diagnosed with a fracture of a bone in your finger.    A fracture is a break in a bone. It means the same thing as saying a "broken bone." In general fractures heal over about 6-8 weeks. The broken bone will eventually become stronger at the site of the break than in the surrounding area. At first, fractures are often treated with  a splint. Although the splint will help keep your finger from moving, it may be replaced with a cast when you visit an orthopedic (bone) doctor. Most fractures heal using a splint or cast, but some require surgery. An orthopedic doctor will help decide if your fracture needs surgery.    Fractures are treated with medication to reduce pain, a splint or cast to reduce movement, and Resting, Icing, Compressing and Elevating the injured area. Remember this as "RICE."   REST : Limit the use of the injured body part.   ICE: By applying ice to the affected area, swelling and pain can be reduced. Place some ice cubes in a re-sealable (Ziploc) bag and add some water. Put a thin washcloth between the bag and the skin. Apply the ice bag to the area for at least 20 minutes. Do this at least 4 times per day. Using the ice for longer times and more frequently is OK. NEVER APPLY  ICE DIRECTLY TO THE SKIN.   COMPRESS: Compression means to apply pressure around the injured area such as with a splint, cast or an ace bandage. Compression decreases swelling and improves comfort. Compression should be tight enough to relieve swelling but not so tight as to decrease circulation. Increasing pain, numbness, tingling, or change in skin color, are all signs of decreased circulation.   ELEVATE: Elevate the injured part. For example, a fractured arm can be elevated by placing the arm in a sling while awake or by propping it up on pillows while lying down.    You have been given a SPLINT to decrease pain and help to keep the injured area from moving. You should use the splint until you follow up with the referral orthopedic (bone) doctor.    Use the following SPLINT CARE instructions frequently throughout the day:   Check capillary refill (circulation) in the fingernails by pressing on the nail and then releasing it. The nail bed should turn white when you press on it and then return to pink in less than 2 seconds after you let go.   Watch for swelling of the area beyond the splint.   If the skin of the hand or fingers is excessively cold, pale, or numb to the touch, the splint may be too tight. You can loosen the wrap holding the splint in place, or you can return here or go to the nearest Emergency Department to have it adjusted.    YOU SHOULD SEEK MEDICAL ATTENTION IMMEDIATELY, EITHER HERE OR AT THE NEAREST EMERGENCY DEPARTMENT, IF ANY OF THE FOLLOWING OCCURS:   You experience a severe increase in pain or swelling in the affected area.   You develop new numbness and tingling in or below the affected area.   You develop a cold, pale finger that appears to have a problem with its blood supply.

## 2012-12-16 NOTE — ED Notes (Addendum)
Pt reports was carrying treadmill yesterday and safety lock gave out and treadmill landed on his right thumb. Swelling noted. Pt also reports twisted back at the same time, pain to lower back. Gait steady. Denies numbness/tingling. Refused motrin in triage.

## 2012-12-16 NOTE — ED Provider Notes (Signed)
EMERGENCY DEPARTMENT HISTORY AND PHYSICAL EXAM     Physician/Midlevel provider first contact with patient: 12/16/12 0046         Date: 12/16/2012  Patient Name: Michael White Sr.    History of Presenting Illness     Chief Complaint   Patient presents with   . Finger Injury       History Provided By: Patient    Chief Complaint: Thumb injury  Onset: Today  Timing: Sudden  Location: R thumb  Quality: Ache  Severity: Moderate  Modifying Factors:Worse with movement   Associated Symptoms: R thumb swelling. Low back pain    Additional History: Michael White. is a 57 y.o. male c/o R thumb pain and associated swelling after jamming it today while moving a treadmill. Pt also notes low back pain as a result of the incident that is worse with walking but he is ambulatory. Denies any incontinence or leg numbness, weakness. 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    PCP: Pcp, Noneorunknown, MD      Current Facility-Administered Medications   Medication Dose Route Frequency Provider Last Rate Last Dose   . [COMPLETED] diazepam (VALIUM) tablet 5 mg  5 mg Oral Once Elliot Cousin, MD   5 mg at 12/16/12 0051   . [COMPLETED] ibuprofen (ADVIL,MOTRIN) tablet 600 mg  600 mg Oral Once Elliot Cousin, MD   600 mg at 12/16/12 0051   . [COMPLETED] oxyCODONE-acetaminophen (PERCOCET) 5-325 MG per tablet 1 tablet  1 tablet Oral Once Elliot Cousin, MD   1 tablet at 12/16/12 6387     Current Outpatient Prescriptions   Medication Sig Dispense Refill   . ibuprofen (ADVIL,MOTRIN) 200 MG tablet Take 200 mg by mouth every 6 (six) hours as needed.           Past History     Past Medical History:  History reviewed. No pertinent past medical history.    Past Surgical History:  History reviewed. No pertinent past surgical history.    Family History:  History reviewed. No pertinent family history.    Social History:  History   Substance Use Topics   . Smoking status: Never Smoker    . Smokeless tobacco: Not on file   . Alcohol Use: No        Allergies:  No Known Allergies    Review of Systems     Review of Systems   Constitutional: Negative for fever.   Cardiovascular: Negative for chest pain.   Musculoskeletal: Positive for myalgias (R Thumb ) and back pain.   Neurological: Negative for tingling, focal weakness and loss of consciousness.         Physical Exam   BP 162/90  Pulse 76  Temp 98.1 F (36.7 C)  Resp 18  Ht 1.803 m  Wt 72.576 kg  BMI 22.33 kg/m2  SpO2 99%    Physical Exam   Constitutional: He is well-developed, well-nourished, and in no distress. No distress.   HENT:   Head: Normocephalic and atraumatic.   Cardiovascular: Normal rate and regular rhythm.    Pulmonary/Chest: Effort normal and breath sounds normal. No respiratory distress. He has no wheezes.   Musculoskeletal:        Right thumb with swelling and tenderness. No subungual hematoma.    Neurological: He has normal reflexes. Gait normal.        Sensorimotor 5/5 b/l. Normal plantar and dorsiflexion of b/l feet   Skin: Skin is  warm and dry. He is not diaphoretic.   Psychiatric: Mood and affect normal.         Diagnostic Study Results     Labs -     Results     ** No Results found for the last 24 hours. **          Radiologic Studies -   Radiology Results (24 Hour)     Procedure Component Value Units Date/Time    Finger Right Minimum 2 Vw [[id number]]     Order Status:Sent  Updated:12/16/12 0100      .      Medical Decision Making   I am the first provider for this patient.    I reviewed the vital signs, available nursing notes, past medical history, past surgical history, family history and social history.    Vital Signs-Reviewed the patient's vital signs.     Patient Vitals for the past 12 hrs:   BP Temp Pulse Resp   12/16/12 0026 162/90 mmHg 98.1 F (36.7 C) 76  18        Pulse Oximetry Analysis - Normal 99% on RA    ED Course:   1:13 AM  Pt is feeling better and would like to go home.  Discussed test results with pt and counseled on diagnosis, f/u plans, and signs and  symptoms when to return to ED.  Pt is stable and ready for discharge.      Provider Notes: Instructed to f/u with hand/plastics  Procedures:      Diagnosis     Clinical Impression:   1. Thumb fracture, right, closed, initial encounter        _______________________________    Attestations:  This note is prepared by [employer] acting as Scribe for Tomasa Rand, MD.    Tomasa Rand, MD- The scribe's documentation has been prepared under my direction and personally reviewed by me in its entirety.  I confirm that the note above accurately reflects all work, treatment, procedures, and medical decision making performed by me.      _______________________________          Elliot Cousin, MD  12/16/12 [phone]

## 2013-03-24 ENCOUNTER — Other Ambulatory Visit (HOSPITAL_COMMUNITY): Payer: Self-pay | Admitting: Family Medicine

## 2013-03-24 ENCOUNTER — Other Ambulatory Visit (HOSPITAL_COMMUNITY): Payer: Self-pay | Admitting: *Deleted

## 2013-03-24 ENCOUNTER — Ambulatory Visit (HOSPITAL_COMMUNITY)
Admission: RE | Admit: 2013-03-24 | Discharge: 2013-03-24 | Disposition: A | Discharge status: Home / Self Care | Payer: Medicare Other | Source: Ambulatory Visit | Attending: Vascular Surgery | Admitting: Vascular Surgery

## 2013-03-24 DIAGNOSIS — R0989 Other specified symptoms and signs involving the circulatory and respiratory systems: Secondary | ICD-10-CM

## 2013-04-03 DIAGNOSIS — I471 Supraventricular tachycardia: Secondary | ICD-10-CM

## 2013-04-03 HISTORY — DX: Supraventricular tachycardia: I47.1

## 2013-04-14 ENCOUNTER — Emergency Department: Payer: Medicaid Other

## 2013-04-14 ENCOUNTER — Observation Stay: Payer: Medicaid Other | Admitting: Hospitalist

## 2013-04-14 ENCOUNTER — Observation Stay
Admission: EM | Admit: 2013-04-14 | Discharge: 2013-04-15 | Disposition: A | Discharge status: Home / Self Care | Payer: Medicaid Other | Attending: Internal Medicine | Admitting: Internal Medicine

## 2013-04-14 DIAGNOSIS — R0602 Shortness of breath: Secondary | ICD-10-CM | POA: Insufficient documentation

## 2013-04-14 DIAGNOSIS — I498 Other specified cardiac arrhythmias: Principal | ICD-10-CM | POA: Insufficient documentation

## 2013-04-14 DIAGNOSIS — R55 Syncope and collapse: Secondary | ICD-10-CM | POA: Insufficient documentation

## 2013-04-14 DIAGNOSIS — I10 Essential (primary) hypertension: Secondary | ICD-10-CM | POA: Insufficient documentation

## 2013-04-14 DIAGNOSIS — F17200 Nicotine dependence, unspecified, uncomplicated: Secondary | ICD-10-CM | POA: Insufficient documentation

## 2013-04-14 DIAGNOSIS — R079 Chest pain, unspecified: Secondary | ICD-10-CM | POA: Insufficient documentation

## 2013-04-14 LAB — BASIC METABOLIC PANEL
BUN: 12 mg/dL (ref 9.0–21.0)
CO2: 25 mEq/L (ref 22–29)
Calcium: 9.4 mg/dL (ref 8.5–10.5)
Chloride: 105 mEq/L (ref 98–107)
Creatinine: 1 mg/dL (ref 0.7–1.3)
Glucose: 90 mg/dL (ref 70–100)
Potassium: 4.5 mEq/L (ref 3.5–5.1)
Sodium: 137 mEq/L (ref 136–145)

## 2013-04-14 LAB — CBC AND DIFFERENTIAL
Basophils Absolute Automated: 0.03 (ref 0.00–0.20)
Basophils Automated: 0 %
Eosinophils Absolute Automated: 0.21 (ref 0.00–0.70)
Eosinophils Automated: 3 %
Hematocrit: 42.4 % (ref 42.0–52.0)
Hgb: 13.3 g/dL (ref 13.0–17.0)
Immature Granulocytes Absolute: 0.01
Immature Granulocytes: 0 %
Lymphocytes Absolute Automated: 3.02 (ref 0.50–4.40)
Lymphocytes Automated: 37 %
MCH: 22.3 pg — ABNORMAL LOW (ref 28.0–32.0)
MCHC: 31.4 g/dL — ABNORMAL LOW (ref 32.0–36.0)
MCV: 71.1 fL — ABNORMAL LOW (ref 80.0–100.0)
MPV: 9.6 fL (ref 9.4–12.3)
Monocytes Absolute Automated: 0.76 (ref 0.00–1.20)
Monocytes: 9 %
Neutrophils Absolute: 4.17 (ref 1.80–8.10)
Neutrophils: 51 %
Nucleated RBC: 0 (ref 0–1)
Platelets: 257 (ref 140–400)
RBC: 5.96 (ref 4.70–6.00)
RDW: 22 % — ABNORMAL HIGH (ref 12–15)
WBC: 8.2 (ref 3.50–10.80)

## 2013-04-14 LAB — TROPONIN I: Troponin I: 0.01 ng/mL (ref 0.00–0.09)

## 2013-04-14 LAB — ECG 12-LEAD
Atrial Rate: 57 {beats}/min
P Axis: 53 degrees
P-R Interval: 144 ms
Q-T Interval: 430 ms
QRS Duration: 78 ms
QTC Calculation (Bezet): 418 ms
R Axis: -77 degrees
T Axis: -9 degrees
Ventricular Rate: 57 {beats}/min

## 2013-04-14 LAB — TSH: TSH: 0.43 (ref 0.35–4.94)

## 2013-04-14 LAB — MAGNESIUM: Magnesium: 2.2 mg/dL (ref 1.6–2.6)

## 2013-04-14 LAB — CKMB: Creatinine Kinase MB (CKMB): 4.2 ng/mL (ref 0.0–4.9)

## 2013-04-14 LAB — I-STAT TROPONIN
i-STAT Troponin: 0.01 ng/mL (ref 0.00–0.09)
i-STAT Troponin: 0.01 ng/mL (ref 0.00–0.09)

## 2013-04-14 LAB — GFR: EGFR: >60

## 2013-04-14 LAB — CK
Creatine Kinase (CK): 255 U/L — ABNORMAL HIGH (ref 30–200)
Creatine Kinase (CK): 157 U/L (ref 30–200)

## 2013-04-14 LAB — I-STAT CREATININE: Creatinine I-Stat: 1.2 mg/dL (ref 0.6–1.5)

## 2013-04-14 MED ORDER — SODIUM CHLORIDE 0.9 % IJ SOLN
3.0000 mL | Freq: Three times a day (TID) | INTRAMUSCULAR | Status: DC
Start: 2013-04-14 — End: 2013-04-15

## 2013-04-14 MED ORDER — HYDROCHLOROTHIAZIDE 25 MG PO TABS
12.5000 mg | ORAL_TABLET | Freq: Every day | ORAL | Status: DC
Start: 2013-04-14 — End: 2013-04-15
  Administered 2013-04-14: 12.5 mg via ORAL
  Filled 2013-04-14: qty 1

## 2013-04-14 MED ORDER — HYDROCODONE-ACETAMINOPHEN 5-325 MG PO TABS
1.0000 | ORAL_TABLET | ORAL | Status: DC | PRN
Start: 2013-04-14 — End: 2013-04-15

## 2013-04-14 MED ORDER — ASPIRIN 325 MG PO TABS
325.0000 mg | ORAL_TABLET | Freq: Once | ORAL | Status: AC
Start: 2013-04-14 — End: 2013-04-14
  Administered 2013-04-14: 325 mg via ORAL
  Filled 2013-04-14: qty 1

## 2013-04-14 MED ORDER — LISINOPRIL 5 MG PO TABS
5.0000 mg | ORAL_TABLET | Freq: Every day | ORAL | Status: DC
Start: 2013-04-14 — End: 2013-04-15
  Administered 2013-04-14: 5 mg via ORAL
  Filled 2013-04-14: qty 1

## 2013-04-14 MED ORDER — NICOTINE 21 MG/24HR TD PT24
1.0000 | MEDICATED_PATCH | Freq: Every day | TRANSDERMAL | Status: DC
Start: 2013-04-14 — End: 2013-04-15
  Administered 2013-04-14 – 2013-04-15 (×2): 1 via TRANSDERMAL
  Filled 2013-04-14 (×3): qty 1

## 2013-04-14 MED ORDER — IBUPROFEN 400 MG PO TABS
200.0000 mg | ORAL_TABLET | Freq: Four times a day (QID) | ORAL | Status: DC | PRN
Start: 2013-04-14 — End: 2013-04-15

## 2013-04-14 MED ORDER — LISINOPRIL 5 MG PO TABS
10.0000 mg | ORAL_TABLET | Freq: Every day | ORAL | Status: DC
Start: 2013-04-14 — End: 2013-04-14

## 2013-04-14 MED ORDER — HYDRALAZINE HCL 25 MG PO TABS
25.0000 mg | ORAL_TABLET | Freq: Three times a day (TID) | ORAL | Status: DC | PRN
Start: 2013-04-14 — End: 2013-04-15

## 2013-04-14 MED ORDER — ASPIRIN 81 MG PO TBEC
81.0000 mg | DELAYED_RELEASE_TABLET | Freq: Every day | ORAL | Status: DC
Start: 2013-04-14 — End: 2013-04-15

## 2013-04-14 NOTE — ED Provider Notes (Signed)
Date: 04/14/2013  Time: 10:52 AM  Patient Name: Michael White, Michael White.  Attending Physician: Claiborne Billings, MD    Attending Note:   I have reviewed and agree with history. The pertinent physical exam has been documented. The patient was seen and examined by the  midlevel Gaynelle Arabian, NP. The plan of care was discussed with me.     I saw and examined this patient myself.       Selected historical findings: 57 y.o.male c/o 10 years of episodic chest pain with diaphoresis and dizziness.  He was evaluated at [facility] and was d/c-ed home 4 years ago.  Last episode was 2 days ago, usually resolves with bearing down - as instructed at [facility].  On Sunday, 3 days ago, he had a similar episode to his previous episodes, as well as another episode last night.  No known exacerbating/alleviating factors.    DISPOSITION AND TREATMENT PLAN     Final diagnoses:   Syncope   Chest pain      ED Disposition     Observation Admitting Physician: Odette Horns [facility] [[id number]]  Diagnosis: Chest pain [[id number]]  Estimated Length of Stay: < 2 midnights  Tentative Discharge Plan?: Home or Self Care [1]  Patient Class: Observation [104]              Medical Decision Making and Clinical Course    Laine Giovanetti is a 57 y.o. male with 10 years of intermittent chest pain has not had an episode for two years, but had an episode 3 days ago as well as yesterday, brief, self-limited with associated syncope x 2.  Will check basic labs, cardiac labs, EKG, chest xray, contact cardiology.     Differential Diagnosis (not completely inclusive): SVT, atrial fibrillation, arrythmia, CAD, pericardial effusion, electrolyte abnormality (hyponatremia, hypo/hyper-kalemia, hypo/hyper-calcemia).    New Prescriptions    No medications on file            CLINICAL INFORMATION     Physical Examination  BP 168/118  Pulse 74  Temp 98 F (36.7 C)  Resp 18  Ht 1.803 m  Wt 72.576 kg  BMI 22.33 kg/m2  SpO2 97%    CONSTITUTIONAL: Patient is afebrile, Vital signs stable,  Well appearing, Patient appears comfortable, Alert and oriented X 3.   HEAD: Atraumatic, Normocephalic.   EYES: Eyes are normal to inspection, Pupils equal, round and reactive to light, Extraocular muscles intact, Sclera are normal, Conjunctiva are normal.   ENT: Mouth normal to inspection.   NECK: Normal ROM, Cervical spine nontender.   RESP: Chest is nontender, Breath sounds normal, No respiratory distress.   CARDIOVASCULAR: RRR, Heart sounds normal, 2+ pulses in all ext   ABDOMEN: Abdomen is soft, nontender, no rebound/guarding, No masses, No distension.   BACK: There is no CVA Tenderness, There is no tenderness to palpation, Normal inspection.   LOWER EXTREMITY: Inspection normal, No cyanosis, No clubbing, No edema, Normal range of motion, No calf tenderness.   SKIN: Skin is warm/dry, normal color.   PSYCHIATRIC: Normal affect.   NEURO: GCS is 15, No focal motor/sens deficits, Speech normal.                Diagnostic Study Results    Labs -   Labs Reviewed   CBC AND DIFFERENTIAL - Abnormal; Notable for the following:     MCV 71.1 (*)     MCH 22.3 (*)     MCHC 31.4 (*)     RDW 22 (*)  All other components within normal limits   CK - Abnormal; Notable for the following:     Creatine Kinase (CK) 255 (*)     All other components within normal limits   BASIC METABOLIC PANEL   GFR   I-STAT TROPONIN   MAGNESIUM   CKMB   TSH    Narrative:     With next blood draw              Radiologic Studies -   XR CHEST AP PORTABLE    Final Result:      Since the prior examination there has been no interval change.        Colonel Bald, MD     04/14/2013 12:10 PM   CT HEAD WO CONTRAST    Final Result:      1. No evidence of intracranial hemorrhage, mass, or acute infarction is    detected.     2. Subcortical and periventricular leukomalacia in both cerebral    hemispheres suggestive of age indeterminate small vessel ischemic    change.          Glendora Score, MD     04/14/2013 12:33 PM         EKG was Reviewed and  Analyzed interpreted by Claiborne Billings, MD: Yes    ECG - Sinus bradycardia at 57 bpm.  Left axis deviation.  Early repolarization in V2.            MEDICATIONS GIVEN IN EMERGENCY DEPARTMENT       Medications   nicotine (NICODERM CQ) 21 MG/24HR patch 1 patch (1 patch Transdermal Patch Applied 04/14/13 1511)   aspirin tablet 325 mg (325 mg Oral Given 04/14/13 1103)       PAST HISTORY     Medical History  History reviewed. No pertinent past medical history.    Surgical History  History reviewed. No pertinent past surgical history.  Family History  History reviewed. No pertinent family history.    Social History  History     Social History   . Marital Status: Legally Separated     Spouse Name: N/A     Number of Children: N/A   . Years of Education: N/A     Occupational History   . Not on file.     Social History Main Topics   . Smoking status: Never Smoker    . Smokeless tobacco: Not on file   . Alcohol Use: No   . Drug Use:    . Sexually Active: Not on file     Other Topics Concern   . Not on file     Social History Narrative   . No narrative on file       REVIEW OF SYSTEMS     Pertinent Positives and Negatives noted in the HPI.  All Other Systems Reviewed and Negative: Yes    RADIOLOGY STUDIES     Radiologic study results reviewed by EDP: Yes  Radiologic Studies Interpreted (viewed) by EDP: Yes    [facility] (IF APPLICABLE)     Pain Meds for Long Bone Fractures: N/A  Rhythm Strip Interpretation / Cardiac Monitor Analysis: Yes  Laboratory results reviewed and interpreted by EDP: Yes  History obtained from additional source: No  Pulse Ox Analysis: Normal  Critical Care Time: No Critical Care Time   _______________________________    Note Attestation  I was acting as a scribe for Tyson Babinski, MD on Speranza,Arrian D White.  Treatment Team: Scribe: Ninfa Linden     I am the first provider for this patient and I personally performed the services documented. Treatment Team: Scribe: Orpha Bur, Maisie Fus is  scribing for me on Feazell,Carel D White.. This note accurately reflects work and decisions made by me.  Tyson Babinski, MD              Tyson Babinski, MD  04/14/13 1600

## 2013-04-14 NOTE — Progress Notes (Signed)
ADMISSION HISTORY AND PHYSICAL EXAM    Date Time: 04/14/2013 2:22 PM  Patient Name: Michael White, Michael SR.  Attending Physician: Tyson Babinski, MD  Primary Care Physician: Christa See, MD  Chief Complaint:  Chief Complaint   Patient presents with   . Chest Pain           History of Presenting Illness:   Michael Enderle. is a 57 y.o. male who presents to the hospital with syncope x 2 start as dizziness, palpitation and chest pain with passing out for few minute. It happened on Sunday and last night, no cough, URI or fever.  NO intake of any unusual caffeine, weight loss product or supplement.  Had similar episode 2 years ago but only lasted few seconds with no LOC.  He takes OTC motrin prn for aches or arthritis pain, last dose was 2 days ago.    Past Medical History:   Syncope.      Past Surgical History:   History reviewed. No pertinent past surgical history.    Family History:   Father died of CHF at age 106.    Social History:     History     Social History   . Marital Status: Legally Separated     Spouse Name: N/A     Number of Children: N/A   . Years of Education: N/A     Social History Main Topics   . Smoking status: Never Smoker    . Smokeless tobacco: Not on file   . Alcohol Use: No   . Drug Use:    . Sexually Active: Not on file     Other Topics Concern   . Not on file     Social History Narrative   . No narrative on file       Allergies:   No Known Allergies    Medications:     Prior to Admission medications    Medication Sig Start Date End Date Taking? Authorizing Provider   ibuprofen (ADVIL,MOTRIN) 200 MG tablet Take 200 mg by mouth every 6 (six) hours as needed.    [provider]   ibuprofen (ADVIL,MOTRIN) 600 MG tablet Take 1 tablet (600 mg total) by mouth every 6 (six) hours as needed for Pain or Fever. 12/16/12   Elliot Cousin, MD       Review of Systems:     General: no fever, chills, malaise, fatigue, weight loss   HEENT: no headache, blurred vision, diplopia,photophobia, sore  throat, tinnitus  CVS: no chest pain, dyspnea on exertion, lower extremity edema, paroxysmal nocturnal dyspnea  Lung:no cough, hemoptysis, shortness of breath, wheezing  GI: no abdominal discomfort, nausea, vomiting, diarrhea, constipation, hematemesis, melena, hematochezia  GU: no urinary frequency, dysuria, hematuria  Neurologic:no focal neurologic deficits, dysarthria, gait ataxia, paraesthesias, sensory loss, memory loss, vertigo  Endocrine: no polydipsia, polyphagia, cold or heat intolerance  Musculoskeletal: no arthralgias, joint swellings or deformity  Heme: no easy bruising or clotting tendencies.  Skin: no rash, pruritis, or other skin irritation.  Psych: no anxiety, depression, suicidal ideations or thoughts.    Physical Exam:     Filed Vitals:    04/14/13 1300   BP: 165/97   Pulse: 72   Temp: 98   Resp: 18   SpO2: 99%       No intake or output data in the 24 hours ending 04/14/13 1422    General: awake, alert, oriented x 3; no acute distress.  HE ENT:  perrla, eo mi, sclera anicteric  oropharynx clear without lesions, mucous membranes moist  Neck: supple, no lymphadenopathy, no thyromegaly, no JVD, no carotid bruits  Cardiovascular: regular rate and rhythm, no murmurs, rubs or gallops  Lungs: clear to auscultation bilaterally, without wheezing, rhonchi, or rales  Abdomen: soft, non-tender, non-distended; no palpable masses, no hepatosplenomegaly, normoactive bowel sounds, no rebound or guarding  Extremities: no clubbing, cyanosis, or edema  Neuro: cranial nerves grossly intact, strength 5/5 in upper and lower extremities, sensation intact,   Skin: no rashes or lesions noted    Labs:     Results     Procedure Component Value Units Date/Time    LAB-Magnesium [[id number]] Collected:04/14/13 1053    Specimen Information:Blood Updated:04/14/13 1328     Magnesium 2.2 mg/dL     CK-MB [[id number]] [id number]:64/40/34 1053     Creatinine Kinase MB (CKMB) 4.2 ng/mL Updated:04/14/13 1153    CBC with differential  [[id number]]  (Abnormal) Collected:04/14/13 1053    Specimen Information:Blood / Blood Updated:04/14/13 1135     WBC 8.20      RBC 5.96      Hgb 13.3 g/dL      Hematocrit 87.5 %      MCV 71.1 (L) fL      MCH 22.3 (L) pg      MCHC 31.4 (L) g/dL      RDW 22 (H) %      Platelets 257      MPV 9.6 fL      Neutrophils 51 %      Lymphocytes Automated 37 %      Monocytes 9 %      Eosinophils Automated 3 %      Basophils Automated 0 %      Immature Granulocyte 0 %      Nucleated RBC 0      Neutrophils Absolute 4.17      Abs Lymph Automated 3.02      Abs Mono Automated 0.76      Abs Eos Automated 0.21      Absolute Baso Automated 0.03      Absolute Immature Granulocyte 0.01     Basic Metabolic Panel (BMP) [[id number]] Collected:04/14/13 1053    Specimen Information:Blood Updated:04/14/13 1121     Glucose 90 mg/dL      BUN 88.4 mg/dL      Creatinine 1.0 mg/dL      CALCIUM 9.4 mg/dL      Sodium 166 mEq/L      Potassium 4.5 mEq/L      Chloride 105 mEq/L      CO2 25 mEq/L     CK with MB if indicated [[id number]]  (Abnormal) Collected:04/14/13 1053    Specimen Information:Blood Updated:04/14/13 1121     Creatine Kinase (CK) 255 (H) U/L     GFR [[id number]] Collected:04/14/13 1053     EGFR >60.0 Updated:04/14/13 1121    i-Stat Troponin [[id number]] Collected:04/14/13 1057     i-STAT Troponin 0.01 ng/mL Updated:04/14/13 1111          12 Lead EKG: sinus bradycardia 57 per min LAD, unchanged from 08/13/11     -CXR: no acute change    -CT scan: Head no acute change  I personally reviewed all the labs, X-rays and test results as above.   Assessment:     Patient Active Problem List   Diagnosis   . Chest pain with syncope no pain at present ekg non acute troponin is normal   . Hypertension new  onset / no prior history   . Syncope and collapse neurologically baseline   . Nicotine abuse         Plan:   1. Serial enzymes  2. TSH, lipid profile in am  3. ECHO  4. Carotid doppler  5. Cardiology eval requested.    Time Spent:   More than 45 minutes  spent F2F and at bedside in arranging care  All concerns addressed with the patient and family at bedside    Care Team:   Patient discussed with cardiology service    Disposition:  Today's date: 04/14/2013  Anticipated medical stability for discharge:   November,  13 - Morning  Reason for ongoing hospitalization: cardiac work up  Anticipated discharge needs: none        Providence Stivers Priscille Heidelberg, MD  04/14/2013    CC: Christa See, MD

## 2013-04-14 NOTE — ED Notes (Signed)
Patient is resting comfortably. 

## 2013-04-14 NOTE — ED Notes (Signed)
Troponin 0.01

## 2013-04-14 NOTE — ED Notes (Signed)
Pt still in CT scanner  

## 2013-04-14 NOTE — ED Notes (Signed)
Pt lying in bed, cardiologist (Dr. Seth Bake) at bedside consulting with patient, no apparent distress

## 2013-04-14 NOTE — Consults (Signed)
[facility] CONSULTATION    Date Time: 04/14/2013 4:22 PM  Patient Name: Michael White, Michael SR.  Requesting Physician: Tyson Babinski, MD    Reason for Consultation:   Syncope    History:   Michael Thall. is a 57 y.o. male with a history of tachycardia who presented to the hospital on 04/14/2013 with syncope.  Patient reports that last night he had a sensation of tachycardia, dizziness, CP, and diaphoresis.  It lasted for about 10 minutes and then resolved.  30 seconds later he passed out.  He quickly came to and was aware of his surroundings.  On Sunday he was walking back to his house and had a similar episode but was SOB as well.  After about 10 minutes he felt better but then had a syncopal episode.  He has had about 25 episodes of symptoms over the past 15 years and of those, he has passed out about 10 times.  In March of 2013 he was at the [facility] and had a 12 lead documenting PAT with a heart rate of 209.  He has never had a stress test or echocardiogram.  He has gone to the ER previously but never had a workup.  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     Past Medical History:    No prior history of any health problems.  He claims his BP has been climbing more recently.    Family History:   No pertinent family history.    Social History:   Has children.  Moves furniture for a living.   Has smoked since age 57yo.  Now smokes 2 packs per week.   History   Substance Use Topics   . Smoking status:    . Smokeless tobacco: Not on file   . Alcohol Use: No      Allergies:   No Known Allergies  Medications:   Home Meds:  Ibuprofen PRN    Scheduled Meds:      [COMPLETED] aspirin 325 mg Oral Once   nicotine 1 patch Transdermal Daily     Continuous Infusions:   PRN Meds:  Review of Systems:   All other systems were reviewed and were negative except as noted in the HPI.  Physical Exam:   Vital Signs: BP 168/118  Pulse 74  Temp 98 F  (36.7 C)  Resp 18  Ht 1.803 m (5\' 11" )  Wt 72.576 kg (160 lb)  BMI 22.33 kg/m2  SpO2 97% No intake or output data in the 24 hours ending 04/14/13 1622  General Appearance:  Alert, well-appearing male in no acute distress.    HEENT: Sclera anicteric, conjunctiva without pallor, moist mucous membranes, normal dentition. No arcus.   Neck:  Supple without jugular venous distention. Thyroid nonpalpable. Normal carotid upstroke without bruits.   Chest: Clear to auscultation bilaterally with good air movement and respiratory effort and no wheezes, rales, or rhonchi   Cardiovascular: Normal S1 and S2 without murmurs, gallops or rub. PMI of normal size and nondisplaced.   Abdomen: Soft, nontender, nondistended, with normoactive bowel sounds. No organomegaly.  No pulsatile masses, or bruits.   Extremities: Warm without edema, clubbing, or cyanosis. All peripheral pulses are full and equal.   Skin: Normal coloration and turgor, no rash, xanthoma or xanthelasma.   Musculoskeletal: no joint tenderness, deformity or swelling  Neuro: Alert and oriented x3. Grossly intact. Strength is symmetrical. Normal  mood and affect.   Cardiographics:   ECG: SB at 57 bpm.  LAD.  rSR' in V1 with high take of of the ST segment in V2.   Telemetry: SR  Labs Reviewed:     Recent Labs   Basename 04/14/13 1053    WBC 8.20    HGB 13.3    HCT 42.4    PLT 257     Recent Labs   Basename 04/14/13 1057 04/14/13 1053    TROPI -- --    ISTATTROPONI 0.01 --    CK -- 255*    CKMB -- 4.2    BNP -- --    Recent Labs   [facility] 04/14/13 1053    NA 137    K 4.5    CO2 25    BUN 12.0    CREAT 1.0    GLU 90    MG 2.2     Recent Labs   Basename 04/14/13 1439    INR --    TSH 0.43    CHOL --    TRIG --    HDL --    LDL --        Rads:     Radiology Results (24 Hour)     Procedure Component Value Units Date/Time    CT Head without Contrast [[id number]] Collected:04/14/13 1230    Order Status:Completed  Updated:04/14/13 1237    Narrative:    HISTORY: Syncope.  Headache.     COMPARISON: None.    TECHNIQUE: A noncontrast CT scan of the head was performed.    FINDINGS:     The ventricles are sulci are normal in size and configuration for age.    No intracranial hemorrhage or mass is detected.  The basilar cisterns  are clear.  Gray-white differentiation is maintained, with no evidence  of acute infarction.     There are periventricular and subcortical white matter lucencies, which  are most consistent with age indeterminate, but likely chronic,  microvascular ischemic disease.      The calvarium and skull base are intact.  No fluid is seen in the  paranasal sinuses.  The middle ear cavity and mastoid air cells are  aerated bilaterally.          Impression:      1. No evidence of intracranial hemorrhage, mass, or acute infarction is  detected.   2. Subcortical and periventricular leukomalacia in both cerebral  hemispheres suggestive of age indeterminate small vessel ischemic  change.      Glendora Score, MD   04/14/2013 12:33 PM    Chest AP Portable [[id number]] Collected:04/14/13 1208    Order Status:Completed  Updated:04/14/13 1214    Narrative:    CLINICAL HISTORY: Palpitations    COMPARISON: 03/07/2007    FINDINGS: Single AP portable view of the chest was exposed.Marland Kitchen Heart size  and contour are normal.  Lungs are clear with normal pulmonary  vascularity.  No pleural effusion, hilar or mediastinal prominence is  evident. No airspace consolidation or pneumothorax is seen. The  visualized bones are unremarkable.      Impression:      Since the prior examination there has been no interval change.    Colonel Bald, MD   04/14/2013 12:10 PM          Assessment:   1.  Admit with syncope with documented SVT on 12 lead EKG 08/2011 in [facility].  Patient likely having recurrent episodes and syncope could be during conversion.  2.  Chest pain likely secondary to PAT.  Initial troponin is normal.  EKG shows no acute ST changes.  Can carry heavy loads up stairs without CP.   3.  HTN,  uncontrolled.   4.  Tobacco use since age 43.     Plan:   1.  Check 2D echocardiogram  2.  Check 2 sets of cardiac enzymes  3.  Exercise nuclear stress test tomorrow at 9:30 am to evaluate for myocardial ischemia but doubtful.   4.  Consult EP.  [facility] to see soon.   5.  Begin Lisinopril 10 mg daily and HCTZ 12.5 mg daily.     Signed by: Ted Mcalpine, NP  [facility] [phone] or [phone] (M-F 8:30am-5pm)  Office: [phone]    cc:Pcp, Noneorunknown, MD  Patient seen and examined. Has had documented PAT in March 2013 and episodes may be due to PAT itself or off set pauses as he converts. Nevertheless will have EP evaluate for ablation. Discussed with patient and he is agreeable. Will check echo and nuclear. He will need treatment of his HBP as well.

## 2013-04-14 NOTE — ED Notes (Signed)
Family at bedside. 

## 2013-04-14 NOTE — ED Notes (Signed)
Pt lying in bed, no apparent distress, updated on plan of care.

## 2013-04-14 NOTE — ED Notes (Addendum)
States over the past ten years he has been having "mild seizures" with no diagnosis. States Sunday that his family witnessed him blacking out while outside, then on Tuesday he states he blacked out in the kitchen with family witnesses. Reports he comes in today because he is concerned that this is a pattern.

## 2013-04-14 NOTE — ED Notes (Signed)
C/o episodes where his heart beats fast, diaphoresis, dizziness and chest pain lasting 2-5 minutes in the past 10 years. He has been to the doctor and they found nothing. He had one of these episodes on Sunday then felt better and 30 seconds later he had a syncopal episode and hit his head. He has had some head and neck pain since then. He had another episode last night. Pt denies traveling outside the country for the last month, and denies being exposed to anyone who has traveled outside of the country in the last month.

## 2013-04-14 NOTE — Consults (Signed)
[facility] Consult Note    Date Time: 04/14/2013 4:30 PM  Patient Name: Michael White, Michael SR.  Requesting Physician: Tyson Babinski, MD      Reason for Consultation:     SVT, recurrent syncope.    History:   Michael Nosbisch. is a 57 y.o. male who presents to the hospital on 04/14/2013 with recurrent syncope and palpitations.    Hx of SVT, last major episode March 2013 when EMS noted HR 209, did not follow up with any physicians.  Admits to "attacks" of palpitations and chest pain 1-2 times per year for the past 15 years.   Not on any meds.  Over the past few days he has had two episodes of HA, sudden onset of palpitations, chest pain, then brief LOC.    After second episode last night, he came to ER today.    Currently well, no distress, no fevers/chills, cough, neuro changes or skin rash.   ROS otherwise negative.    Past Medical History:   History reviewed. No pertinent past medical history.    Palpitations and documented SVT.    Past Surgical History:   History reviewed. No pertinent past surgical history.    Family History:   History reviewed. No pertinent family history.    Social History:     History     Social History   . Marital Status: Legally Separated     Spouse Name: N/A     Number of Children: N/A   . Years of Education: N/A     Social History Main Topics   . Smoking status: Never Smoker    . Smokeless tobacco: Not on file   . Alcohol Use: No   . Drug Use:    . Sexually Active: Not on file     Other Topics Concern   . Not on file     Social History Narrative   . No narrative on file     Lives in [place] with family, no tobacco or etoh, no drug use.    Allergies:   No Known Allergies    Medications:     Current Facility-Administered Medications   Medication Dose Route Frequency   . [COMPLETED] aspirin  325 mg Oral Once   . nicotine  1 patch Transdermal Daily       Review of Systems:   CONSTITUTIONAL: The patient denies any fever or chills or recent weight loss or weight gain. HEENT: The  patient denies any headaches, vertigo, sore throat or rhinorrhea. Eyes: The patient denies any double or blurred vision or eye pain. CARDIOVASCULAR: The patient denies any chest discomfort, chest pain, palpitations, irregular rhythm, tachycardia or diaphoresis. RESPIRATORY: The patient denies any cough or shortness of breath or wheezing. GASTROINTESTINAL: The patient denies any nausea, vomiting, diarrhea or abdominal pain. GENITOURINARY: The patient denies any dysuria, frequency, urgency or hematuria. MUSCULOSKELETAL: The patient denies any myalgias, arthralgias or edema. SKIN: The patient denies any skin changes, rashes or lesions. NEUROLOGIC: The patient denies any weakness, dizziness, focal neurological change or headache. Has no history of TIA or CVA.  ENDOCRINE: The patient denies a history of diabetes or thyroid disorders.  Has no heat or cold intolerance. HEMATOLOGIC: The patient denies any history of easy bruising, anemia or eczema.      Physical Exam:   BP 166/79  Pulse 66  Temp 98 F (36.7 C)  Resp 18  Ht 1.803 m (5\' 11" )  Wt 72.576 kg (160 lb)  BMI 22.33 kg/m2  SpO2 99%    General Appearance:  Alert, cooperative, no distress, appears stated age   Head:  Normocephalic, without obvious abnormality, atraumatic   Neck: Supple, symmetrical, trachea midline, no adenopathy, thyroid: not enlarged, symmetric, no tenderness/mass/nodules, no carotid bruit or JVD   Back:   Symmetric, no curvature, ROM normal, no CVA tenderness   Lungs:   Clear to auscultation bilaterally, respirations unlabored   Chest Wall:  No tenderness or deformity   Heart:  Regular rate and rhythm, S1, S2 normal, no murmur, rub or gallop   Abdomen:   Soft, non-tender, bowel sounds active all four quadrants,  no masses, no organomegaly   Extremities: Extremities normal, atraumatic, no cyanosis or edema   Pulses: 2+ and symmetric   Skin: Skin color, texture, turgor normal, no rashes or lesions   Neurologic: Normal         Labs Reviewed:      Results     Procedure Component Value Units Date/Time    TSH [[id number]] Collected:04/14/13 1439    Specimen Information:Blood Updated:04/14/13 1537     Thyroid Stimulating Hormone 0.43     Narrative:    With next blood draw    LAB-Magnesium [[id number]] Collected:04/14/13 1053    Specimen Information:Blood Updated:04/14/13 1328     Magnesium 2.2 mg/dL     CK-MB [[id number]] [id number]:27/[birth year] 1053     Creatinine Kinase MB (CKMB) 4.2 ng/mL Updated:04/14/13 1153    CBC with differential [[id number]]  (Abnormal) Collected:04/14/13 1053    Specimen Information:Blood / Blood Updated:04/14/13 1135     WBC 8.20      RBC 5.96      Hgb 13.3 g/dL      Hematocrit 25.9 %      MCV 71.1 (L) fL      MCH 22.3 (L) pg      MCHC 31.4 (L) g/dL      RDW 22 (H) %      Platelets 257      MPV 9.6 fL      Neutrophils 51 %      Lymphocytes Automated 37 %      Monocytes 9 %      Eosinophils Automated 3 %      Basophils Automated 0 %      Immature Granulocyte 0 %      Nucleated RBC 0      Neutrophils Absolute 4.17      Abs Lymph Automated 3.02      Abs Mono Automated 0.76      Abs Eos Automated 0.21      Absolute Baso Automated 0.03      Absolute Immature Granulocyte 0.01     Basic Metabolic Panel (BMP) [[id number]] Collected:04/14/13 1053    Specimen Information:Blood Updated:04/14/13 1121     Glucose 90 mg/dL      BUN 33.2 mg/dL      Creatinine 1.0 mg/dL      CALCIUM 9.4 mg/dL      Sodium 951 mEq/L      Potassium 4.5 mEq/L      Chloride 105 mEq/L      CO2 25 mEq/L     CK with MB if indicated [[id number]]  (Abnormal) Collected:04/14/13 1053    Specimen Information:Blood Updated:04/14/13 1121     Creatine Kinase (CK) 255 (H) U/L     GFR [[id number]] Collected:04/14/13 1053     EGFR >60.0 Updated:04/14/13 1121    i-Stat Troponin [[id number]] Collected:04/14/13 1057  i-STAT Troponin 0.01 ng/mL Updated:04/14/13 1111            Studies:   EKG:  Sinus rhythm, some upsloping ST elevation in V1-V2, likely early repolarization.    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    Telemetry:  Sinus rhythm, HR 70s    Assessment:     Hx of documented SVT with HR 209 in March 2013.    Recurrent syncope and "attacks" which are concerning for recurrent symptomatic PSVT with syncope.    Also with chest pain, could be ischemia provoked by SVT.    Plan:     -agree with admission to tele floor  -echo and stress test per Dr. Letta Kocher tempo  -recommend EP study and SVT ablation tomorrow afternoon if stress neg.    DW patient risk of death MI CVA and other imponderables and he agrees to proceed.    Keep NPO after midnight.    ----------------------------  Alvia Grove, MD  [facility]   Pager [phone]  Office [phone]  Spectralink Ext# [phone]          Signed by: Thereasa Parkin

## 2013-04-14 NOTE — Plan of Care (Signed)
OBSERVATION UNIT PLAN OF CARE NOTE     Date Time: 04/14/2013 10:14 PM  Patient Name: Michael White, Michael SR.  MRN: [id number]    TELEMETRY:  BP 140/87  Pulse 69  Temp 97.1 F (36.2 C) (Oral)  Resp 18  Ht 1.803 m (5\' 11" )  Wt 72.576 kg (160 lb)  BMI 22.33 kg/m2  SpO2 98%  Assessment/Plan  Patient in bed watching TV  Neg CE x 2  Echo and carotid doppler pending   NPO after MN for NUC stress test in am      Rexford Maus, NP  Adult Observation Unit Nurse Practitioner  [phone] or [phone]

## 2013-04-14 NOTE — ED Notes (Signed)
Ck 1.2, troponin 0.01

## 2013-04-14 NOTE — ED Provider Notes (Signed)
Physician/Midlevel provider first contact with patient: 04/14/13 1030         EMERGENCY DEPARTMENT HISTORY AND PHYSICAL EXAM    Date: 04/14/2013  Patient Name: Michael White, Michael SR.  Midlevel Provider: Gaynelle Arabian, NP  Diagnosis and Treatment Plan       Presumptive Diagnosis:   1. Syncope    2. Chest pain         Treatment Plan:see patient instructions for treatment and plan    History of Presenting Illness     Chief Complaint:   Chief Complaint   Patient presents with   . Chest Pain     Michael White. is a 57 y.o. male who  has no past medical history on file. c/o Sunday onset 2-5 min of dizziness, diaphoresis and cp followed by syncope w repeat episode of identical last noc. LOC lasts "1 second", witnessed and rt baseline ms immediate. Pt reports has had similar in past 10 years, no episodes for last 2 years, w negative workup in past though has never sen cards/stress test.  Reports he went to [facility] 4 years ago for same and was told to"bear down" when symptoms occur, pt reports this helps to resolve dizziness and cp.  Pt denies LE edema/pain, no recent travel, no hx anemia/MI/CVA/CA/thyroid dysfunction. Denies fam hx of ca/cards hx/dm/thyroid. No neck pain, no trauma, no focal numbness/weakness, no slurred speech, no facial droop, no incontinence, no bp. Reports slight ha.  Denies hx htn.  Takes no meds, denies drug use, has no pmd.  No diarrhea, no recent illness, no brbpr, no melea, no duysuiria/hematuria        PCP: Pcp, Noneorunknown, MD      Past Medical History     History reviewed. No pertinent past medical history.    History reviewed. No pertinent past surgical history.    Family History     History reviewed. No pertinent family history.    Social History     History     Social History   . Marital Status: Legally Separated     Spouse Name: N/A     Number of Children: N/A   . Years of Education: N/A     Occupational History   . Not on file.     Social History Main Topics   . Smoking status: Never  Smoker    . Smokeless tobacco: Not on file   . Alcohol Use: No   . Drug Use:    . Sexually Active: Not on file     Other Topics Concern   . Not on file     Social History Narrative   . No narrative on file        Allergies     No Known Allergies    Medications     Current facility-administered medications:[COMPLETED] aspirin tablet 325 mg, 325 mg, Oral, Once, Naythan Douthit, NP, 325 mg at 04/14/13 1103  Current outpatient prescriptions:ibuprofen (ADVIL,MOTRIN) 200 MG tablet, Take 200 mg by mouth every 6 (six) hours as needed., Disp: , Rfl: ;  ibuprofen (ADVIL,MOTRIN) 600 MG tablet, Take 1 tablet (600 mg total) by mouth every 6 (six) hours as needed for Pain or Fever., Disp: 30 tablet, Rfl: 0    Review of Systems     Pertinent Positives and Negatives noted in the HPI.  All Other Systems Reviewed and Negative: Yes    Physical Exam   BP 181/93  Pulse 70  Temp 98 F (36.7 C)  Resp  20  Ht 1.803 m  Wt 72.576 kg  BMI 22.33 kg/m2  SpO2 97%    Constitutional: Vital signs reviewed.Well appearing. Easily Conversant  Head: Normocephalic, atraumatic, no battles/raccoons, no hemotympanum  Eyes: No conjunctival injection. No discharge. PERRL, eom intact bl  ENT: Mucous membranes moist. OPC  Neck: Normal range of motion. Trachea midline.  Respiratory/Chest:  Clear to auscultation. No respiratory distress  Cardiovascular: Regular rate and rhythm.  No murmur. Peripheral pulses intact  Abdomen: Soft,non tender. No guarding. No masses or hepatosplenomegaly  UpperExtremity:No edema or cyanosis  LowerExtremity:No edema or cyanosis  Neurological: No focal motor deficits by observation. Speech normal. Memory normal.  Skin: Warm and dry. No rash.  Psychiatric: Normal affect and normal concentration for age      Diagnostic Study Results     Labs -     Results     Procedure Component Value Units Date/Time    LAB-Magnesium [[id number]] Collected:04/14/13 1053    Specimen Information:Blood Updated:04/14/13 1328     Magnesium 2.2 mg/dL      CK-MB [[id number]] Collected:04/14/13 1053     Creatinine Kinase MB (CKMB) 4.2 ng/mL Updated:04/14/13 1153    CBC with differential [[id number]]  (Abnormal) Collected:04/14/13 1053    Specimen Information:Blood / Blood Updated:04/14/13 1135     WBC 8.20      RBC 5.96      Hgb 13.3 g/dL      Hematocrit 30.8 %      MCV 71.1 (L) fL      MCH 22.3 (L) pg      MCHC 31.4 (L) g/dL      RDW 22 (H) %      Platelets 257      MPV 9.6 fL      Neutrophils 51 %      Lymphocytes Automated 37 %      Monocytes 9 %      Eosinophils Automated 3 %      Basophils Automated 0 %      Immature Granulocyte 0 %      Nucleated RBC 0      Neutrophils Absolute 4.17      Abs Lymph Automated 3.02      Abs Mono Automated 0.76      Abs Eos Automated 0.21      Absolute Baso Automated 0.03      Absolute Immature Granulocyte 0.01     Basic Metabolic Panel (BMP) [[id number]] Collected:04/14/13 1053    Specimen Information:Blood Updated:04/14/13 1121     Glucose 90 mg/dL      BUN 29.5 mg/dL      Creatinine 1.0 mg/dL      CALCIUM 9.4 mg/dL      Sodium 284 mEq/L      Potassium 4.5 mEq/L      Chloride 105 mEq/L      CO2 25 mEq/L     CK with MB if indicated [[id number]]  (Abnormal) Collected:04/14/13 1053    Specimen Information:Blood Updated:04/14/13 1121     Creatine Kinase (CK) 255 (H) U/L     GFR [[id number]] Collected:04/14/13 1053     EGFR >60.0 Updated:04/14/13 1121    i-Stat Troponin [[id number]] Collected:04/14/13 1057     i-STAT Troponin 0.01 ng/mL Updated:04/14/13 1111          Radiologic Studies -   Radiology Results (24 Hour)     Procedure Component Value Units Date/Time    CT Head without Contrast [[id number]] Collected:04/14/13 1230    Order Status:Completed  Updated:04/14/13 1237    Narrative:    HISTORY: Syncope. Headache.     COMPARISON: None.    TECHNIQUE: A noncontrast CT scan of the head was performed.    FINDINGS:     The ventricles are sulci are normal in size and configuration for age.    No intracranial hemorrhage or mass is detected.  The  basilar cisterns  are clear.  Gray-white differentiation is maintained, with no evidence  of acute infarction.     There are periventricular and subcortical white matter lucencies, which  are most consistent with age indeterminate, but likely chronic,  microvascular ischemic disease.      The calvarium and skull base are intact.  No fluid is seen in the  paranasal sinuses.  The middle ear cavity and mastoid air cells are  aerated bilaterally.          Impression:      1. No evidence of intracranial hemorrhage, mass, or acute infarction is  detected.   2. Subcortical and periventricular leukomalacia in both cerebral  hemispheres suggestive of age indeterminate small vessel ischemic  change.      Glendora Score, MD   04/14/2013 12:33 PM    Chest AP Portable [[id number]] Collected:04/14/13 1208    Order Status:Completed  Updated:04/14/13 1214    Narrative:    CLINICAL HISTORY: Palpitations    COMPARISON: 03/07/2007    FINDINGS: Single AP portable view of the chest was exposed.Marland Kitchen Heart size  and contour are normal.  Lungs are clear with normal pulmonary  vascularity.  No pleural effusion, hilar or mediastinal prominence is  evident. No airspace consolidation or pneumothorax is seen. The  visualized bones are unremarkable.      Impression:      Since the prior examination there has been no interval change.    Colonel Bald, MD   04/14/2013 12:10 PM      .    Clinical Course in the Emergency Department     Stable and nad    Ddx: mi, pe, arrhythmia, pna,cva, vasovagal syncope, anemia  Plan: labs, ekg, cxr, ct head    EKG compared to 2013, march   V2 ST elevation and "saddlebacking" possibly c/w brugada; rate and rhythm normal, 70 bpm, normal axis      Reeval: cont to be wo pain in ER, no episodes of syncope or palpiations, admitted to hosp for cards wkup; Dr Elby Showers req obs.    Medical Decision Making   I am the first provider for this patient.  I reviewed the vital signs, nursing notes, past medical history, past surgical  history, family history and social history.  I have reviewed the patient's previous charts.  Vital Signs - BP 181/93  Pulse 70  Temp 98 F (36.7 C)  Resp 20  Ht 1.803 m  Wt 72.576 kg  BMI 22.33 kg/m2  SpO2 97%             Gaynelle Arabian, NP  04/14/13 1102    Gaynelle Arabian, NP  04/14/13 1111    Gaynelle Arabian, NP  04/14/13 1112    Gaynelle Arabian, NP  04/14/13 1353

## 2013-04-14 NOTE — ED Notes (Signed)
Patient denies pain and is resting comfortably.  

## 2013-04-14 NOTE — ED Notes (Signed)
Family at bedside. Provided patient with a boxed lunch and ginger ale.

## 2013-04-15 ENCOUNTER — Observation Stay: Payer: Medicaid Other

## 2013-04-15 ENCOUNTER — Encounter: Payer: Self-pay | Admitting: Anesthesiology

## 2013-04-15 ENCOUNTER — Ambulatory Visit: Admit: 2013-04-15 | Payer: Self-pay | Admitting: Clinical Cardiac Electrophysiology

## 2013-04-15 ENCOUNTER — Observation Stay: Payer: Medicaid Other | Admitting: Anesthesiology

## 2013-04-15 ENCOUNTER — Encounter
Admission: EM | Disposition: A | Discharge status: Home / Self Care | Payer: Self-pay | Source: Home / Self Care | Attending: Emergency Medicine

## 2013-04-15 ENCOUNTER — Encounter: Payer: Self-pay | Admitting: Nurse Practitioner

## 2013-04-15 HISTORY — PX: CARDIAC ABLATION: SHX51081

## 2013-04-15 LAB — LIPID PANEL
Cholesterol / HDL Ratio: 4
Cholesterol: 207 mg/dL — ABNORMAL HIGH (ref 0–199)
HDL: 52 mg/dL (ref >40)
LDL Calculated: 140 mg/dL — ABNORMAL HIGH (ref 0–99)
Triglycerides: 73 mg/dL (ref 34–149)
VLDL Calculated: 15 mg/dL (ref 10–40)

## 2013-04-15 LAB — HEMOLYSIS INDEX: Hemolysis Index: 3 (ref 0–9)

## 2013-04-15 SURGERY — ABLATION - SVT
Anesthesia: Anesthesia General

## 2013-04-15 MED ORDER — LACTATED RINGERS IV SOLN
INTRAVENOUS | Status: DC | PRN
Start: 2013-04-15 — End: 2013-04-15

## 2013-04-15 MED ORDER — GLYCOPYRROLATE 0.2 MG/ML IJ SOLN
INTRAMUSCULAR | Status: AC
Start: 2013-04-15 — End: ?
  Filled 2013-04-15: qty 1

## 2013-04-15 MED ORDER — ACETAMINOPHEN 325 MG PO TABS
ORAL_TABLET | ORAL | Status: AC
Start: 2013-04-15 — End: ?
  Filled 2013-04-15: qty 2

## 2013-04-15 MED ORDER — TECHNETIUM TC 99M TETROFOSMIN INJECTION
1.0000 | Freq: Once | Status: AC
Start: 2013-04-15 — End: 2013-04-15
  Administered 2013-04-15: 1 via INTRAVENOUS
  Filled 2013-04-15: qty 1

## 2013-04-15 MED ORDER — REGADENOSON 0.4 MG/5ML IV SOLN
INTRAVENOUS | Status: AC
Start: 2013-04-15 — End: ?
  Filled 2013-04-15: qty 5

## 2013-04-15 MED ORDER — LISINOPRIL-HYDROCHLOROTHIAZIDE 10-12.5 MG PO TABS
1.0000 | ORAL_TABLET | Freq: Every day | ORAL | Status: DC
Start: 2013-04-15 — End: 2015-04-20

## 2013-04-15 MED ORDER — PROMETHAZINE HCL 25 MG/ML IJ SOLN
6.2500 mg | Freq: Once | INTRAMUSCULAR | Status: DC | PRN
Start: 2013-04-15 — End: 2013-04-15

## 2013-04-15 MED ORDER — MIDAZOLAM HCL 2 MG/2ML IJ SOLN
INTRAMUSCULAR | Status: DC | PRN
Start: 2013-04-15 — End: 2013-04-15
  Administered 2013-04-15: 1 mg via INTRAVENOUS

## 2013-04-15 MED ORDER — ISOPROTERENOL 2 MCG/ML INFUSION (AX EP)
Status: AC
Start: 2013-04-15 — End: 2013-04-15
  Administered 2013-04-15: 1 ug/min via INTRAVENOUS
  Filled 2013-04-15: qty 100

## 2013-04-15 MED ORDER — PROPOFOL 10 MG/ML IV EMUL
INTRAVENOUS | Status: AC
Start: 2013-04-15 — End: ?
  Filled 2013-04-15: qty 50

## 2013-04-15 MED ORDER — FENTANYL CITRATE 0.05 MG/ML IJ SOLN
25.0000 ug | INTRAMUSCULAR | Status: DC | PRN
Start: 2013-04-15 — End: 2013-04-15

## 2013-04-15 MED ORDER — PROPOFOL 10 MG/ML IV EMUL
INTRAVENOUS | Status: AC
Start: 2013-04-15 — End: ?
  Filled 2013-04-15: qty 20

## 2013-04-15 MED ORDER — FENTANYL CITRATE 0.05 MG/ML IJ SOLN
INTRAMUSCULAR | Status: DC | PRN
Start: 2013-04-15 — End: 2013-04-15
  Administered 2013-04-15: 25 ug via INTRAVENOUS

## 2013-04-15 MED ORDER — FENTANYL CITRATE 0.05 MG/ML IJ SOLN
INTRAMUSCULAR | Status: AC
Start: 2013-04-15 — End: ?
  Filled 2013-04-15: qty 2

## 2013-04-15 MED ORDER — REGADENOSON 0.4 MG/5ML IV SOLN
0.4000 mg | Freq: Once | INTRAVENOUS | Status: AC | PRN
Start: 2013-04-15 — End: 2013-04-15
  Administered 2013-04-15: 0.4 mg via INTRAVENOUS
  Filled 2013-04-15: qty 5

## 2013-04-15 MED ORDER — GLYCOPYRROLATE 0.2 MG/ML IJ SOLN
INTRAMUSCULAR | Status: DC | PRN
Start: 2013-04-15 — End: 2013-04-15
  Administered 2013-04-15: 0.1 mg via INTRAVENOUS

## 2013-04-15 MED ORDER — ASPIRIN EC 81 MG PO TBEC
81.0000 mg | DELAYED_RELEASE_TABLET | Freq: Every day | ORAL | Status: DC
Start: 2013-04-15 — End: 2013-04-15

## 2013-04-15 MED ORDER — DIPHENHYDRAMINE HCL 50 MG/ML IJ SOLN
6.2500 mg | Freq: Four times a day (QID) | INTRAMUSCULAR | Status: DC | PRN
Start: 2013-04-15 — End: 2013-04-15

## 2013-04-15 MED ORDER — ACETAMINOPHEN 500 MG PO TABS
500.0000 mg | ORAL_TABLET | Freq: Four times a day (QID) | ORAL | Status: DC | PRN
Start: 2013-04-15 — End: 2013-04-15
  Administered 2013-04-15: 500 mg via ORAL

## 2013-04-15 MED ORDER — LIDOCAINE HCL (PF) 1 % IJ SOLN
INTRAMUSCULAR | Status: AC
Start: 2013-04-15 — End: 2013-04-15
  Administered 2013-04-15: 10 mL via INTRAMUSCULAR
  Filled 2013-04-15: qty 30

## 2013-04-15 MED ORDER — PROPOFOL INFUSION 10 MG/ML
INTRAVENOUS | Status: DC | PRN
Start: 2013-04-15 — End: 2013-04-15
  Administered 2013-04-15: 40 mg via INTRAVENOUS
  Administered 2013-04-15: 50 mg via INTRAVENOUS
  Administered 2013-04-15: 100 mg via INTRAVENOUS

## 2013-04-15 MED ORDER — PROPOFOL INFUSION 10 MG/ML
INTRAVENOUS | Status: DC | PRN
Start: 2013-04-15 — End: 2013-04-15
  Administered 2013-04-15: 160 ug/kg/min via INTRAVENOUS
  Administered 2013-04-15: 180 ug/kg/min via INTRAVENOUS

## 2013-04-15 MED ORDER — MIDAZOLAM HCL 2 MG/2ML IJ SOLN
INTRAMUSCULAR | Status: AC
Start: 2013-04-15 — End: ?
  Filled 2013-04-15: qty 2

## 2013-04-15 MED ORDER — ONDANSETRON HCL 4 MG/2ML IJ SOLN
4.0000 mg | Freq: Once | INTRAMUSCULAR | Status: DC | PRN
Start: 2013-04-15 — End: 2013-04-15

## 2013-04-15 MED ORDER — NICOTINE 21 MG/24HR TD PT24
1.0000 | MEDICATED_PATCH | Freq: Every day | TRANSDERMAL | Status: DC
Start: 2013-04-15 — End: 2013-04-20

## 2013-04-15 MED ORDER — HYDROMORPHONE HCL PF 1 MG/ML IJ SOLN
0.5000 mg | INTRAMUSCULAR | Status: DC | PRN
Start: 2013-04-15 — End: 2013-04-15

## 2013-04-15 MED ORDER — HEPARIN (PORCINE) IN NACL 2-0.9 UNIT/ML-% IJ SOLN
INTRAMUSCULAR | Status: AC
Start: 2013-04-15 — End: 2013-04-15
  Filled 2013-04-15: qty 2000

## 2013-04-15 NOTE — Plan of Care (Signed)
Pt cardiac enzymes neg x 3, plan for echo nuc stress and possible ablation

## 2013-04-15 NOTE — Procedures (Signed)
Lexiscan nuclear scan  04/15/2013 :       Baseline EKG:    NSR, Q in V2, brief runs of AIVR (LBBB configuration)  No significant ST changes with infusion   No chest pain  + Occasional PVC  HR went up to about 125 (sinus) with infusion    Plan:    Await nuclear imaging.    I was present and personally supervised this test.      3:47 PM  04/15/2013    Darrol Poke, MD, [facility])    Office: [phone]

## 2013-04-15 NOTE — Discharge Instructions (Signed)
After Catheter Ablation  After your catheter ablation procedure, you'll be taken to a recovery room. You may need to lie flat for2-6 hours while the insertion sites close up. During this time you'll be monitored by a nurse. You may go home later that day. Or, you may stay in the hospital overnight.    Going Home  When it's time to go home, have an adult family member or friend drive you. Most people can walk, climb stairs, and perform light activity soon after catheter ablation. You can most likely return to your full routine within a few days. But you may be told to avoid running, heavy lifting, and other strenuous activities for a short time.  Follow-Up  You'll have a follow-up visit to go over the results of your catheter ablation. Your healthcare provider will tell you if you can stop taking heart rhythm medications. In many cases, one ablation is enough to treat an arrhythmia. But sometimes the problem returns or another is found. If this happens, you may need a second catheter ablation. Tell your healthcare provider if you have any new or returning symptoms.  Common Symptoms After Catheter Ablation  In the first few weeks after catheter ablation, you may feel mild chest fullness or aching. You may also feelas if your heart is skipping beats. Or, your heartbeat may feel faster than normal. You may think that your heart rhythm problem is about to return. These sensations are normal and usually go away with time. Talk to your healthcare provider if you're concerned.  When to Call Your Doctor  After your procedure, call your doctor if you have:   Increased bleeding, bruising, or pain at the insertion site   Shortness of breath or chest pain   Coldness, swelling, or numbness of the arm or leg near the insertion site   A bruise or lump at the insertion site that is larger than a walnut   A fever over 100F   Symptoms of your arrhythmia    2000-2014 [employer], [address], [place]  [address]. All rights reserved. This information is not intended as a substitute for professional medical care. Always follow your healthcare professional's instructions.

## 2013-04-15 NOTE — Transfer of Care (Signed)
Anesthesia Transfer of Care Note    Patient: Michael BEHREND Sr.    Procedures performed: Procedure(s) with comments:  ABLATION - SVT    Anesthesia type: General TIVA    Patient location:ICAR    Last vitals:   Filed Vitals:    04/15/13 0440   BP: 140/83   Pulse: 63   Temp: 95.9 F (35.5 C)   Resp: 18   SpO2: 100%       Post pain: Patient not complaining of pain, continue current therapy      Mental Status:awake and alert     Respiratory Function: tolerating room air    Cardiovascular: stable    Nausea/Vomiting: patient not complaining of nausea or vomiting    Hydration Status: adequate    Post assessment: no apparent anesthetic complications and no reportable events, pt very inconsolable in regards to staying still post op, finallylaying on side in pacu. PIV came out during arms moving around, 20g inserted by CRNA in pacu left hand

## 2013-04-15 NOTE — Progress Notes (Signed)
Pt to go to EP lab for ablation shortly, pt informed, awaiting transport.

## 2013-04-15 NOTE — Progress Notes (Signed)
Pt. Transported to nuclear medicine with Aggie Cosier, RN.

## 2013-04-15 NOTE — Progress Notes (Signed)
Nuclear stress test completed; pt given tylenol tabs 2 for c/o's headache; bilateral groin drsg remain dry and intact

## 2013-04-15 NOTE — Progress Notes (Signed)
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    -Bedrest x 4h  -Awaiting Echo and stress test  -Home later today if above wu negative.  -Fu in one month.    ----------------------------  Alvia Grove, MD  [facility]   Pager [phone]  Office [phone]  Spectralink Ext# [phone]

## 2013-04-15 NOTE — Anesthesia Preprocedure Evaluation (Signed)
Anesthesia Evaluation    AIRWAY    Mallampati: II    TM distance: >3 FB  Neck ROM: full  Mouth Opening:full   CARDIOVASCULAR    cardiovascular exam normal, regular and normal       DENTAL    No notable dental hx     PULMONARY    pulmonary exam normal and clear to auscultation     OTHER FINDINGS                      Anesthesia Plan    ASA 3     general                     intravenous induction   Detailed anesthesia plan: general IV        Post op pain management: per surgeon    informed consent obtained    Plan discussed with CRNA.    ECG reviewed  pertinent labs reviewed             [facility] Consult Note   Date Time: 04/14/2013 4:30 PM   Patient Name: [name], [name]   Requesting Physician: Tyson Babinski, MD   Reason for Consultation:    SVT, recurrent syncope.   History:    Michael White. is a 57 y.o. male who presents to the hospital on 04/14/2013 with recurrent syncope and palpitations.   Hx of SVT, last major episode March 2013 when EMS noted HR 209, did not follow up with any physicians. Admits to "attacks" of palpitations and chest pain 1-2 times per year for the past 15 years. Not on any meds. Over the past few days he has had two episodes of HA, sudden onset of palpitations, chest pain, then brief LOC. After second episode last night, he came to ER today.   Currently well, no distress, no fevers/chills, cough, neuro changes or skin rash. ROS otherwise negative.   Past Medical History:    History reviewed. No pertinent past medical history.   Palpitations and documented SVT.   Past Surgical History:    History reviewed. No pertinent past surgical history.   Family History:    History reviewed. No pertinent family history.   Social History:      History      Social History    .  Marital Status:  Legally Separated      Spouse Name:  N/A      Number of Children:  N/A    .  Years of Education:  N/A      Social History Main Topics    .  Smoking status:  Never Smoker    .  Smokeless tobacco:  Not  on file    .  Alcohol Use:  No    .  Drug Use:     .  Sexually Active:  Not on file      Other Topics  Concern    .  Not on file      Social History Narrative    .  No narrative on file    Lives in [place] with family, no tobacco or etoh, no drug use.   Allergies:    No Known Allergies   Medications:      Current Facility-Administered Medications    Medication  Dose  Route  Frequency    .  [COMPLETED] aspirin  325 mg  Oral  Once    .  nicotine  1 patch  Transdermal  Daily      Review of Systems:    CONSTITUTIONAL: The patient denies any fever or chills or recent weight loss or weight gain. HEENT: The patient denies any headaches, vertigo, sore throat or rhinorrhea. Eyes: The patient denies any double or blurred vision or eye pain. CARDIOVASCULAR: The patient denies any chest discomfort, chest pain, palpitations, irregular rhythm, tachycardia or diaphoresis. RESPIRATORY: The patient denies any cough or shortness of breath or wheezing. GASTROINTESTINAL: The patient denies any nausea, vomiting, diarrhea or abdominal pain. GENITOURINARY: The patient denies any dysuria, frequency, urgency or hematuria. MUSCULOSKELETAL: The patient denies any myalgias, arthralgias or edema. SKIN: The patient denies any skin changes, rashes or lesions. NEUROLOGIC: The patient denies any weakness, dizziness, focal neurological change or headache. Has no history of TIA or CVA. ENDOCRINE: The patient denies a history of diabetes or thyroid disorders. Has no heat or cold intolerance. HEMATOLOGIC: The patient denies any history of easy bruising, anemia or eczema.   Physical Exam:    BP 166/79  Pulse 66  Temp 98 F (36.7 C)  Resp 18  Ht 1.803 m (5\' 11" )  Wt 72.576 kg (160 lb)  BMI 22.33 kg/m2  SpO2 99%   General Appearance:  Alert, cooperative, no distress, appears stated age    Head:  Normocephalic, without obvious abnormality, atraumatic    Neck:  Supple, symmetrical, trachea midline, no adenopathy, thyroid: not enlarged,  symmetric, no tenderness/mass/nodules, no carotid bruit or JVD    Back:  Symmetric, no curvature, ROM normal, no CVA tenderness    Lungs:  Clear to auscultation bilaterally, respirations unlabored    Chest Wall:  No tenderness or deformity    Heart:  Regular rate and rhythm, S1, S2 normal, no murmur, rub or gallop    Abdomen:  Soft, non-tender, bowel sounds active all four quadrants, no masses, no organomegaly    Extremities:  Extremities normal, atraumatic, no cyanosis or edema    Pulses:  2+ and symmetric    Skin:  Skin color, texture, turgor normal, no rashes or lesions    Neurologic:  Normal      Labs Reviewed:      Results     Procedure  Component  Value  Units  Date/Time     TSH [[id number]]  Collected:04/14/13 1439     Specimen Information:Blood  Updated:04/14/13 1537      Thyroid Stimulating Hormone  0.43      Narrative:     With next blood draw     LAB-Magnesium [[id number]]  Collected:04/14/13 1053     Specimen Information:Blood  Updated:04/14/13 1328      Magnesium  2.2  mg/dL      CK-MB [[id number]]  Collected:04/14/13 1053      Creatinine Kinase MB (CKMB)  4.2  ng/mL  Updated:04/14/13 1153     CBC with differential [[id number]] (Abnormal)  Collected:04/14/13 1053     Specimen Information:Blood / Blood  Updated:04/14/13 1135      WBC  8.20       RBC  5.96       Hgb  13.3  g/dL       Hematocrit  36.6  %       MCV  71.1 (L)  fL       MCH  22.3 (L)  pg       MCHC  31.4 (L)  g/dL       RDW  22 (H)  %  Platelets  257       MPV  9.6  fL       Neutrophils  51  %       Lymphocytes Automated  37  %       Monocytes  9  %       Eosinophils Automated  3  %       Basophils Automated  0  %       Immature Granulocyte  0  %       Nucleated RBC  0       Neutrophils Absolute  4.17       Abs Lymph Automated  3.02       Abs Mono Automated  0.76       Abs Eos Automated  0.21       Absolute Baso Automated  0.03       Absolute Immature Granulocyte  0.01      Basic Metabolic Panel (BMP) [[id number]]  Collected:04/14/13 1053      Specimen Information:Blood  Updated:04/14/13 1121      Glucose  90  mg/dL       BUN  54.0  mg/dL       Creatinine  1.0  mg/dL       CALCIUM  9.4  mg/dL       Sodium  981  mEq/L       Potassium  4.5  mEq/L       Chloride  105  mEq/L       CO2  25  mEq/L      CK with MB if indicated [[id number]] (Abnormal)  Collected:04/14/13 1053     Specimen Information:Blood  Updated:04/14/13 1121      Creatine Kinase (CK)  255 (H)  U/L      GFR [[id number]]  Collected:04/14/13 1053      EGFR  >60.0  Updated:04/14/13 1121     i-Stat Troponin [[id number]]  Collected:04/14/13 1057      i-STAT Troponin  0.01  ng/mL  Updated:04/14/13 1111         Studies:    EKG: Sinus rhythm, some upsloping ST elevation in V1-V2, likely early repolarization.   EkG March 2013: Narrow complex tach, short RP interval, HR 209, likely AVNRT vs. AVRT.   Telemetry: Sinus rhythm, HR 70s   Assessment:    Hx of documented SVT with HR 209 in March 2013.   Recurrent syncope and "attacks" which are concerning for recurrent symptomatic PSVT with syncope.   Also with chest pain, could be ischemia provoked by SVT.   Plan:    -agree with admission to tele floor   -echo and stress test per Dr. Letta Kocher tempo   -recommend EP study and SVT ablation tomorrow afternoon if stress neg.   DW patient risk of death MI CVA and other imponderables and he agrees to proceed.   Keep NPO after midnight.   ----------------------------   Alvia Grove, MD   [facility]   Pager [phone]   Office [phone]   Spectralink Ext# [phone]       Inpatient Anesthesia Evaluation    Patient Name: IVERY, Michael SR.  Surgeon: Thereasa Parkin, MD  Patient Age / Sex: 57 y.o. / male    Medical History:   History reviewed. No pertinent past medical history.    History reviewed. No pertinent past surgical history.      Allergies:   No Known Allergies  Medications:     No current facility-administered medications for this visit.     Facility-Administered Medications Ordered in Other  Visits   Medication Dose Route Frequency Last Rate Last Dose   . 0.9% sodium chloride with heparin (2 unit/mL) 2-0.9 UNIT/ML-% infusion           . aspirin EC tablet 81 mg  81 mg Oral Daily       . [COMPLETED] aspirin tablet 325 mg  325 mg Oral Once   325 mg at 04/14/13 1103   . hydrALAZINE (APRESOLINE) tablet 25 mg  25 mg Oral Q8H PRN       . hydrochlorothiazide (HYDRODIURIL) tablet 12.5 mg  12.5 mg Oral Daily   12.5 mg at 04/14/13 2051   . HYDROcodone-acetaminophen (NORCO) 5-325 MG per tablet 1 tablet  1 tablet Oral Q4H PRN       . ibuprofen (ADVIL,MOTRIN) tablet 200 mg  200 mg Oral Q6H PRN       . lidocaine (XYLOCAINE) 1 % injection           . lisinopril (PRINIVIL,ZESTRIL) tablet 5 mg  5 mg Oral Daily   5 mg at 04/14/13 2052   . nicotine (NICODERM CQ) 21 MG/24HR patch 1 patch  1 patch Transdermal Daily   1 patch at 04/14/13 1511   . sodium chloride (PF) 0.9 % flush 3 mL  3 mL Intravenous Q8H       . [DISCONTINUED] lisinopril (PRINIVIL,ZESTRIL) tablet 10 mg  10 mg Oral Daily                  Prior to Admission medications    Medication Sig Start Date End Date Taking? Authorizing Provider   ibuprofen (ADVIL,MOTRIN) 200 MG tablet Take 200 mg by mouth every 6 (six) hours as needed.    [provider]   ibuprofen (ADVIL,MOTRIN) 600 MG tablet Take 1 tablet (600 mg total) by mouth every 6 (six) hours as needed for Pain or Fever. 12/16/12   Elliot Cousin, MD     Vitals   Temp:  [95.9 F (35.5 C)-98 F (36.7 C)] 95.9 F (35.5 C)  Heart Rate:  [58-74] 63   Resp Rate:  [18-20] 18   BP: (128-255)/(79-118) 140/83 mmHg    Wt Readings from Last 3 Encounters:   04/14/13 72.576 kg (160 lb)   04/14/13 72.576 kg (160 lb)   12/16/12 72.576 kg (160 lb)     BMI (Estimated Body mass index is 22.33 kg/(m^2) as calculated from the following:    Height as of 04/14/13: 5\' 11" (1.803 m).    Weight as of 04/14/13: 160 lb(72.576 kg).)  Temp Readings from Last 3 Encounters:   04/15/13 95.9 F (35.5 C) Axillary   04/15/13 95.9 F  (35.5 C) Axillary   12/16/12 98.1 F (36.7 C)      BP Readings from Last 3 Encounters:   04/15/13 140/83   04/15/13 140/83   12/16/12 162/90     Pulse Readings from Last 3 Encounters:   04/15/13 63   04/15/13 63   12/16/12 76           Labs:   CBC:  Lab Results   Component Value Date    WBC 8.20 04/14/2013    HGB 13.3 04/14/2013    HCT 42.4 04/14/2013    PLT 257 04/14/2013       Chemistries:  Lab Results   Component Value Date    NA 137 04/14/2013  K 4.5 04/14/2013    CL 105 04/14/2013    CO2 25 04/14/2013    BUN 12.0 04/14/2013    CREAT 1.0 04/14/2013    GLU 90 04/14/2013    CA 9.4 04/14/2013       Coags:  No results found for this basename: PT, PTT, INR     _____________________      Signed by: Tito Dine  04/15/2013   8:41 AM

## 2013-04-15 NOTE — Progress Notes (Signed)
Pt off unit  for EP lab for ablation at this time.

## 2013-04-15 NOTE — Plan of Care (Signed)
Pt arrived to floor with complaints of frequent unexplained syncopal episodes. Pt states has palpatations with chest pain and then without warning has passed out. Pt troponin neg x 2. Plan for echo nuc stress and ablation if stress is neg

## 2013-04-15 NOTE — Plan of Care (Signed)
Pt slept denies pain, cardiac enzymes neg x 3

## 2013-04-15 NOTE — Progress Notes (Signed)
HOSPITAL FOLLOW-UP NOTE    [facility])       LOS: 1 day       I had the pleasure of seeing Michael White today.    Michael Haggard Sr. is a very pleasant 57 y.o.male    SUBJECTIVE:    Seen post SVT ablation  Had brief feeling of racing heart beat after procedure, just lasted a few seconds  Otherwise No CP, SOB, Dizziness, Palpitations    No asthma or wheezing             PAST MEDICAL HISTORY:    Patient Active Problem List   Diagnosis   . Chest pain   . Hypertension   . Syncope and collapse   . Nicotine abuse       Past Medical History   Diagnosis Date   . SVT (supraventricular tachycardia)    . Syncope         Prior to Admission medications    Medication Sig Start Date End Date Taking? Authorizing Provider   ibuprofen (ADVIL,MOTRIN) 200 MG tablet Take 200 mg by mouth every 6 (six) hours as needed.   Yes [provider]   ibuprofen (ADVIL,MOTRIN) 600 MG tablet Take 1 tablet (600 mg total) by mouth every 6 (six) hours as needed for Pain or Fever. 12/16/12  Yes Elliot Cousin, MD       Scheduled Meds:  Current Facility-Administered Medications   Medication Dose Route Frequency   . [COMPLETED] 0.9% sodium chloride with heparin       . aspirin EC  81 mg Oral Daily   . hydrochlorothiazide  12.5 mg Oral Daily   . [COMPLETED] isoproterenol       . [COMPLETED] lidocaine       . lisinopril  5 mg Oral Daily   . nicotine  1 patch Transdermal Daily   . sodium chloride (PF)  3 mL Intravenous Q8H   . [COMPLETED] Tc22m tetrofosmin  1 each Intravenous Once   . [COMPLETED] Tc39m tetrofosmin  1 each Intravenous Once   . [DISCONTINUED] lisinopril  10 mg Oral Daily     Continuous Infusions:   PRN Meds:.acetaminophen, diphenhydrAMINE, fentaNYL, hydrALAZINE, HYDROcodone-acetaminophen, HYDROmorphone, ibuprofen, ondansetron, promethazine, [COMPLETED] regadenoson      ALLERGIES:   No Known Allergies        PHYSICAL EXAM:  BP 124/86  Pulse 67  Temp 97.2 F (36.2 C) (Oral)  Resp 16  Ht 1.803 m (5\' 11" )  Wt 72.576  kg (160 lb)  BMI 22.33 kg/m2  SpO2 98%  Body mass index is 22.33 kg/(m^2).  General:  Well-appearing, alert, cooperative, communicative  Chest- clear   Cor- RR  Normal S1 and S2.  No S3, No S4  No Murmur   Neck- Supple, no JVD  Extremities- no edema  Neuro: Grossly non-focal  Oropharynx:  Mouth- mucus membranes moist  Skin turgor is normal  Eyes:  Sclera are non-icteric          Intake/Output Summary (Last 24 hours) at 04/15/13 1543  Last data filed at 04/15/13 1200   Gross per 24 hour   Intake    400 ml   Output      0 ml   Net    400 ml         Labs Reviewed:     Recent Labs   Basename 04/14/13 1053    WBC 8.20    HGB 13.3    HCT 42.4  PLT 257     Recent Labs   Basename 04/14/13 2321 04/14/13 1654 04/14/13 1057 04/14/13 1053    TROPI 0.01 -- -- --    ISTATTROPONI -- 0.01 0.01 --    CK 157 -- -- 255*    CKMB -- -- -- 4.2    BNP -- -- -- --    Recent Labs   [facility] 04/14/13 1053    NA 137    K 4.5    CO2 25    BUN 12.0    CREAT 1.0    GLU 90    MG 2.2     Recent Labs   Basename 04/15/13 0441 04/14/13 1439    INR -- --    TSH -- 0.43    CHOL 207* --    TRIG 73 --    HDL 52 --    LDL 140* --        Rads:     Radiology Results (24 Hour)     ** No Results found for the last 24 hours. **            IMPRESSION / RECOMMENDATION:      Doing well s/p AVNRT ablation earlier today    No CP today  Cardiac enzymes negative    HTN  Better controlled    PLAN:    Lexiscan Nuclear Stress Test today    Check Echo    04/15/2013  3:47 PM  Darrol Poke, MD, [facility]  [place]    Office: [phone]

## 2013-04-15 NOTE — Progress Notes (Signed)
Received report and patient into [facility] 17 s/p SVT ablation.  B groin sites clean and dry.  + pedal pulses. Assumed patient's care. ID band verified, alert and oriented x 3, VSS, aldrete 10, pain 0/10, rhythm SB 50. Call bell within reach.   IV removed by pt.  New one placed by anes.

## 2013-04-15 NOTE — Progress Notes (Signed)
Groin site non tender/no swelling noted.  Denies any complaints.  +pedal pulses bilateral.  Report to [place] at 2069.  Transported to ultrasound with transporter and [place].

## 2013-04-15 NOTE — Progress Notes (Signed)
Report called to [facility] RN.

## 2013-04-15 NOTE — Anesthesia Postprocedure Evaluation (Signed)
Anesthesia Post Evaluation    Patient: Michael White.    Procedures performed: Procedure(s) with comments:  ABLATION - SVT    Anesthesia type: General TIVA    Patient location:[facility]AR    Last vitals:   Filed Vitals:    04/15/13 1230   BP: 124/86   Pulse: 67   Temp: 97.2 F (36.2 C)   Resp:    SpO2: 98%       Post pain: Patient not complaining of pain, continue current therapy      Mental Status:awake    Respiratory Function: tolerating room air    Cardiovascular: stable    Nausea/Vomiting: patient not complaining of nausea or vomiting    Hydration Status: adequate    Post assessment: no apparent anesthetic complications

## 2013-04-16 LAB — ECG 12-LEAD
Atrial Rate: 67 {beats}/min
P Axis: 72 degrees
P-R Interval: 150 ms
Q-T Interval: 388 ms
QRS Duration: 80 ms
QTC Calculation (Bezet): 409 ms
R Axis: -77 degrees
T Axis: 15 degrees
Ventricular Rate: 67 {beats}/min

## 2013-04-20 ENCOUNTER — Other Ambulatory Visit (INDEPENDENT_AMBULATORY_CARE_PROVIDER_SITE_OTHER): Payer: Self-pay | Admitting: Internal Medicine

## 2013-04-20 MED ORDER — NICOTINE 21 MG/24HR TD PT24
1.0000 | MEDICATED_PATCH | Freq: Every day | TRANSDERMAL | Status: DC
Start: 2013-04-20 — End: 2013-05-18

## 2013-04-20 NOTE — Telephone Encounter (Signed)
Pt states his nicotine patch rx was not sent correctly to pharmacy request we resend it. Patient can be reached at home number.

## 2013-04-20 NOTE — Telephone Encounter (Signed)
Called patient. Said Dr. Seth Bake gave him prescription in hospital.  Refill routed to Dr. Seth Bake.

## 2013-05-18 ENCOUNTER — Ambulatory Visit (INDEPENDENT_AMBULATORY_CARE_PROVIDER_SITE_OTHER): Payer: Medicaid Other | Admitting: Clinical Cardiac Electrophysiology

## 2013-05-18 ENCOUNTER — Encounter (INDEPENDENT_AMBULATORY_CARE_PROVIDER_SITE_OTHER): Payer: Self-pay | Admitting: Clinical Cardiac Electrophysiology

## 2013-05-18 VITALS — BP 145/86 | HR 82 | Ht 71.0 in | Wt 160.0 lb

## 2013-05-18 MED ORDER — NICOTINE 14 MG/24HR TD PT24
1.0000 | MEDICATED_PATCH | Freq: Every day | TRANSDERMAL | Status: DC
Start: 2013-05-18 — End: 2015-09-23

## 2013-05-18 NOTE — Progress Notes (Signed)
[facility] OFFICE VISIT    I had the pleasure of seeing Michael White today in cardiac electrophysiology follow up for routine office visit.     Hx of palpitations and documented SVT.  Underwent slow pathway ablation for AVNRT one month ago, returns for follow up.    He is doing well, no complaints.  Denies any palpitations or SOB since last visit.      Occasionally gets chest pains and LH spells, no true syncope.    Reportedly had normal echo and stress test prior to discharge after ablation.        PMH:   Past Medical History   Diagnosis Date   . SVT (supraventricular tachycardia)    . Syncope    . Hypertension         MEDICATIONS: He has a current medication list which includes the following prescription(s): lisinopril-hydrochlorothiazide - Take 1 tablet by mouth daily, nicotine - Place 1 patch onto the skin daily, and ibuprofen - Take 200 mg by mouth every 6 (six) hours as needed.    Current Outpatient Prescriptions   Medication Sig Dispense Refill   . lisinopril-hydrochlorothiazide (PRINZIDE,ZESTORETIC) 10-12.5 MG per tablet Take 1 tablet by mouth daily.  30 tablet  3   . nicotine (NICODERM CQ) 21 MG/24HR Place 1 patch onto the skin daily.  28 patch  0   . ibuprofen (ADVIL,MOTRIN) 200 MG tablet Take 200 mg by mouth every 6 (six) hours as needed.            Meds reviewed, no changes since last visit.    SH:   History   Substance Use Topics   . Smoking status: Current Every Day Smoker   . Smokeless tobacco: Not on file   . Alcohol Use: Yes      Comment: beer       FH: no history of sudden death    REVIEW OF SYSTEMS: All other systems reviewed and negative except as above.    PHYSICAL EXAMINATION  General Appearance: A well-appearing male in no acute distress.   Vital Signs: BP 145/86  Pulse 82  Ht 1.803 m (5\' 11" )  Wt 72.576 kg (160 lb)  BMI 22.33 kg/m2   HEENT: Sclera anicteric, conjunctiva without pallor, moist mucous membranes, normal dentition.   Neck: Supple without jugular venous distention. Thyroid  nonpalpable. Normal carotid upstrokes without bruits.  Chest: Clear to auscultation bilaterally with good air movement and respiratory effort and no wheezes, rales, or rhonchi  Cardiovascular: Normal S1 and physiologically split S2 without murmurs, gallops or rub. PMI of normal size and nondisplaced.   Abdomen: Soft, nontender. No organomegaly.  No pulsatile masses or bruits.    Extremities: Warm without edema. All peripheral pulses are full and equal.  Skin: No rash, xanthoma or xanthelasma.   Neuro: Alert and oriented x3. Grossly intact. Strength is symmetrical. Normal mood and affect.     ECG: sinus rhythm, nl intervals.      IMPRESSION/RECOMMENDATIONS: Michael White is a 57 y.o. male who presents for routine follow up. Overall, He is stable and doing well.  No further SVT or palpitations after ablation.    Chest pain persists, will see Dr. Ernestine White later this month for evaluation.    -Continue with current medications  -AT this point, can fu with PCP (will establish care in [place], [place]) and Dr. Ernestine White  -Office follow up visit on prn basis.    Thanks.    ----------------------------  Thereasa Parkin,  MD, [facility]  [facility]

## 2013-07-05 ENCOUNTER — Ambulatory Visit (INDEPENDENT_AMBULATORY_CARE_PROVIDER_SITE_OTHER): Payer: Medicaid Other | Admitting: Family Medicine

## 2013-07-29 ENCOUNTER — Telehealth (INDEPENDENT_AMBULATORY_CARE_PROVIDER_SITE_OTHER): Payer: Self-pay | Admitting: Clinical Cardiac Electrophysiology

## 2013-07-29 NOTE — Telephone Encounter (Signed)
PT WOULD LIKE TO SPEAK WITH DR. [facility] REGARDING NEXT STEPS IN CARE. CB# [phone]

## 2013-07-29 NOTE — Telephone Encounter (Signed)
He will see a primary care physician or Dr. Ernestine Mcmurray from cardiology, if he wants more refills.

## 2013-07-29 NOTE — Telephone Encounter (Signed)
Patient says he only wants to talk to you about "male issues".  CB is [phone]

## 2013-07-29 NOTE — Telephone Encounter (Signed)
Spoke with patient.  He is having erectile dysfunction for the past 3-4 months.    He had a normal stress in November 2014, has no chest pain, and is not on nitroglycerin.    I will order him one prescription for Viagra, 20 pills, 100mg  tablet, but he was instructed to only take 50mg  the first time to make sure that he does not get lightheaded or pass out.  He understands.    Please write him script as above.    Thanks.

## 2013-07-30 ENCOUNTER — Other Ambulatory Visit (INDEPENDENT_AMBULATORY_CARE_PROVIDER_SITE_OTHER): Payer: Self-pay

## 2013-07-30 DIAGNOSIS — N529 Male erectile dysfunction, unspecified: Secondary | ICD-10-CM

## 2013-07-30 MED ORDER — SILDENAFIL CITRATE 100 MG PO TABS
100.0000 mg | ORAL_TABLET | Freq: Every day | ORAL | Status: DC | PRN
Start: 2013-07-30 — End: 2015-04-20

## 2013-08-24 ENCOUNTER — Other Ambulatory Visit (INDEPENDENT_AMBULATORY_CARE_PROVIDER_SITE_OTHER): Payer: Self-pay | Admitting: Clinical Cardiac Electrophysiology

## 2013-08-24 DIAGNOSIS — R079 Chest pain, unspecified: Secondary | ICD-10-CM

## 2013-09-06 ENCOUNTER — Ambulatory Visit (INDEPENDENT_AMBULATORY_CARE_PROVIDER_SITE_OTHER): Payer: Medicaid Other | Admitting: Family Medicine

## 2015-04-14 ENCOUNTER — Emergency Department: Payer: Medicaid Other

## 2015-04-14 ENCOUNTER — Emergency Department
Admission: EM | Admit: 2015-04-14 | Discharge: 2015-04-14 | Disposition: A | Discharge status: Home / Self Care | Payer: Medicaid Other | Attending: Emergency Medicine | Admitting: Emergency Medicine

## 2015-04-14 DIAGNOSIS — M25511 Pain in right shoulder: Secondary | ICD-10-CM | POA: Insufficient documentation

## 2015-04-14 DIAGNOSIS — F17200 Nicotine dependence, unspecified, uncomplicated: Secondary | ICD-10-CM | POA: Insufficient documentation

## 2015-04-14 DIAGNOSIS — G8929 Other chronic pain: Secondary | ICD-10-CM | POA: Insufficient documentation

## 2015-04-14 DIAGNOSIS — M7581 Other shoulder lesions, right shoulder: Secondary | ICD-10-CM | POA: Insufficient documentation

## 2015-04-14 DIAGNOSIS — I10 Essential (primary) hypertension: Secondary | ICD-10-CM | POA: Insufficient documentation

## 2015-04-14 MED ORDER — IBUPROFEN 600 MG PO TABS
600.0000 mg | ORAL_TABLET | Freq: Once | ORAL | Status: AC
Start: 2015-04-14 — End: 2015-04-14
  Administered 2015-04-14: 600 mg via ORAL
  Filled 2015-04-14: qty 1

## 2015-04-14 MED ORDER — CLONIDINE HCL 0.1 MG PO TABS
0.1000 mg | ORAL_TABLET | Freq: Once | ORAL | Status: AC
Start: 2015-04-14 — End: 2015-04-14
  Administered 2015-04-14: 0.1 mg via ORAL
  Filled 2015-04-14: qty 1

## 2015-04-14 MED ORDER — LISINOPRIL-HYDROCHLOROTHIAZIDE 10-12.5 MG PO TABS
1.0000 | ORAL_TABLET | Freq: Every day | ORAL | Status: DC
Start: 2015-04-14 — End: 2015-04-20

## 2015-04-14 MED ORDER — IBUPROFEN 600 MG PO TABS
600.0000 mg | ORAL_TABLET | Freq: Three times a day (TID) | ORAL | Status: DC | PRN
Start: 2015-04-14 — End: 2015-09-23

## 2015-04-14 MED ORDER — OXYCODONE-ACETAMINOPHEN 5-325 MG PO TABS
1.0000 | ORAL_TABLET | Freq: Four times a day (QID) | ORAL | Status: DC | PRN
Start: 2015-04-14 — End: 2015-05-04

## 2015-04-14 NOTE — ED Notes (Addendum)
Pain in right shoulder, pain for months, no specific injury, right hand dominant

## 2015-04-14 NOTE — ED Provider Notes (Signed)
EMERGENCY DEPARTMENT HISTORY AND PHYSICAL EXAM     Physician/Midlevel provider first contact with patient: 04/14/15 1712         Date: 04/14/2015  Patient Name: Michael HUY Sr.    History of Presenting Illness     Chief Complaint   Patient presents with   . Shoulder Pain         History Provided By: Patient    Chief Complaint: Shoulder pain  Onset: 3 months ago  Timing: Gradual onset  Duration: Constant  Location: Right shoulder   Quality: Sharp  Severity: Moderate  Modifying Factors: Pain worse when lifting arm and when sleeping on right side. Some relief with Tylenol and Motrin.  Associated Symptoms: Denies weakness or numbness in the right arm, no chest pain, no shortness of breath, no neck pain.    Additional History: Michael White. is a 59 y.o. male with no significant past history presents with a 3 month hx of constant, progressive right shoulder pain that is worse with movement and laying on right side and somewhat improved with Tylenol or Motrin. He notes moving furniture for a living and think he may have injured shoulder months ago. Pt denies associated weakness or numbness in arm, neck pain, fevers, chest pain, back pain or sob.     PCP: No primary care provider on file.      No current facility-administered medications for this encounter.     Current Outpatient Prescriptions   Medication Sig Dispense Refill   . [facility] NTS STEP 1 21 MG/24HR PLACE 1 PATCH ONTO THE SKIN DAILY. 28 each 1   . ibuprofen (ADVIL,MOTRIN) 600 MG tablet Take 1 tablet (600 mg total) by mouth every 8 (eight) hours as needed for Pain or Fever. 20 tablet 0   . lisinopril-hydrochlorothiazide (PRINZIDE,ZESTORETIC) 10-12.5 MG per tablet Take 1 tablet by mouth daily. 30 tablet 3   . lisinopril-hydrochlorothiazide (ZESTORETIC) 10-12.5 MG per tablet Take 1 tablet by mouth daily. 20 tablet 0   . nicotine (NICODERM CQ) 14 MG/24HR Place 1 patch onto the skin daily. 28 patch 0   . oxyCODONE-acetaminophen (PERCOCET) 5-325 MG per tablet Take  1 tablet by mouth every 6 (six) hours as needed for Pain. 15 tablet 0   . sildenafil (VIAGRA) 100 MG tablet Take 1 tablet (100 mg total) by mouth daily as needed for Erectile Dysfunction. 20 tablet 0       Past History     Past Medical History:  Past Medical History   Diagnosis Date   . SVT (supraventricular tachycardia)    . Syncope    . Hypertension        Past Surgical History:  Past Surgical History   Procedure Laterality Date   . Cardiac ablation  04/15/13       Family History:  History reviewed. No pertinent family history.    Social History:  Social History   Substance Use Topics   . Smoking status: Current Every Day Smoker   . Smokeless tobacco: None   . Alcohol Use: Yes      Comment: beer       Allergies:  No Known Allergies    Review of Systems     CONSTITUTIONAL: No fevers or chills  HEENT: No vision changes  RESPIRATORY: No shortness of breath  CARDIOVASCULAR: No chest pain  GI: No abdominal pain  SKIN: No rash  MUSCULOSKELETAL: No back pain, +Right shoulder pain  NEURO: No Headache, No weakness or numbness in  the right arm  ALL OTHER ROS NEGATIVE      Physical Exam   BP 170/102 mmHg  Pulse 88  Temp(Src) 98.9 F (37.2 C) (Oral)  Resp 18  Ht 5\' 11"  (1.803 m)  Wt 71.668 kg  BMI 22.05 kg/m2  SpO2 99%    Vital signs have been reviewed  Constitutional: Well appearing, non toxic  HEAD: Normocephalic, atraumatic  EYES: PERRL, EOMI  ENT: Normal pharynx  NECK: Supple, FROM, NT, no meningeal signs, no midline soft tissue swelling or deformity, no bruits  CHEST: Normal chest excursion with RR 12  CARDIOVASCULAR: Normal S1,S2, no murmurs, rubs, or gallops  RESPIRATORY: Breath sounds clear and equal B, no wheezes, rhonchi, or rales  GI: Normal bowel sounds, non distended, non tender, soft, no guarding or rebound  BACK: No evidence of trauma or deformity.  No tenderness along midline thoracic or lumbar spine  EXT: Normal pulses.  Tenderness along right rotator cuff and biceps tendon, pain with abduction of  right shoulder to 90 degrees with palpable click along shoulder joint with abduction at 90 degrees. No warmth or effusion. Normal axillary sensation. Normal elbow and wrist extension flexion. Normal grip b/l. Normal finger abduction and adduction.   SKIN: Warm and dry, no rash  NEURO: AxOx3, strength 5/5 B, sensation intact B.    Diagnostic Study Results     Labs -     Results     ** No results found for the last 24 hours. **          Radiologic Studies -   Radiology Results (24 Hour)     Procedure Component Value Units Date/Time    Shoulder Right 2+ Views [[id number]] Collected:  04/14/15 1742    Order Status:  Completed Updated:  04/14/15 1746    Narrative:      HISTORY: Right shoulder pain for three months with limited range of  motion    TECHNIQUE: Internal rotation, external rotation and Y views of right  shoulder performed.     COMPARISON: None available    FINDINGS: Alignment is anatomic. No radiographic evidence of acute  fracture. No abnormal periosteal reaction or erosions. A subacromial  spur is demonstrated.      Impression:       Subacromial spur        Ivin Poot, MD   04/14/2015 5:42 PM        .      Medical Decision Making   I am the first provider for this patient.    I reviewed the vital signs, available nursing notes, past medical history, past surgical history, family history and social history.    Vital Signs-Reviewed the patient's vital signs.     Patient Vitals for the past 12 hrs:   BP Temp Pulse Resp   04/14/15 1747 (!) 170/102 mmHg - - -   04/14/15 1718 (!) 178/108 mmHg 98.9 F (37.2 C) 88 18       Pulse Oximetry Analysis - 99% on RA    Old Medical Records: Nursing notes.     ED Course:     5:23 PM - Patient counseled for 3-5 minutes on smoking cessation including risks of smoking and benefits of treatment. Pt also reports that he takes BP medication once a week rather than every day and think likely expired. Counseled pt on proper BP medication use, will set up appointments with a PCP  and ortho, and refill his BP med in time to get  through to PCP appt.    5:51 PM Discussed discharge instructions and all test results with pt. Pt expresses understanding and agrees to follow up. All questions and concerns addressed at this time.  Patient verbalizes understanding of return precautions and importance of follow up.    Provider Notes:     Diagnosis     Clinical Impression:   1. Chronic right shoulder pain    2. Essential hypertension    3. Bone spur of acromioclavicular joint, right        _______________________________    Attestations:  This note is prepared by Marcille Blanco, acting as Scribe for Enriqueta Shutter, MD.    Enriqueta Shutter, MD:  The scribe's documentation has been prepared under my direction and personally reviewed by me in its entirety.  I confirm that the note above accurately reflects all work, treatment, procedures, and medical decision making performed by me.    _______________________________        Isaac Bliss, MD  04/19/15 [phone]

## 2015-04-14 NOTE — Discharge Instructions (Signed)
Joint Pain, shoulder    You have been seen for shoulder joint pain.    Many things can cause pain in your joints. Having joint pain means that you often have inflammation in your joints.     Some things that can cause joint pain are:   Rheumatoid arthritis (inflammation of the joints).   Trauma or injury to your joints.   Overuse of your joints.   Gout (swelling of the joints).   Osteoarthritis (breakdown of cartilage in the joints).   Inflammation of a joint tendon (tendinitis).    It is not yet known why you are having joint pains. You might need another exam or more tests to find out why you have these symptoms. At this time, the cause of your symptoms does not seem dangerous. You don t need to stay in the hospital.    Some things you can try at home are:   Rest the affected joint.   Frequent stretching.   Massage.   NSAID medications like ibuprofen (Advil or Motrin), naproxen (Aleve, Naprosyn).   Elevating the joints that hurt.    Follow the instructions for any medication you are prescribed.       We don't believe your condition is dangerous right now. However, you need to be careful. Sometimes a problem that seems small can get serious later. Therefore, it is very important for you to come back here or go to the nearest Emergency Department if you don t get better or your symptoms get worse.    YOU SHOULD SEEK MEDICAL ATTENTION IMMEDIATELY, EITHER HERE OR AT THE NEAREST EMERGENCY DEPARTMENT, IF ANY OF THE FOLLOWING OCCUR:     You have a fever (temperature higher than 100.17F or 38C).   Your pain does not go away or gets worse.   You notice redness over the joint.   Your painful joint gets very swollen.   You don t feel better after treatment.   Any other symptoms, concerns, or not getting better as expected.    If you can t follow up with your doctor, or if at any time you feel you need to be rechecked or seen again, come back here or go to the nearest emergency  department.    Hypertension    You have been previously diagnosed with hypertension. Hypertension means high blood pressure that happens every day. To be diagnosed with hypertension, the blood pressure readings must be abnormally high at least 3 different times. There are causes for high blood pressure that doctors can find. These include being overweight or having a kidney or hormone problem. This is called secondary hypertension. When a doctor does not know the cause, it is called "essential hypertension." Both types of hypertension may require medicine to lower the blood pressure. Some people with high blood pressure will improve if they limit the sodium (salt) in their diets.    High blood pressure often has no symptoms. However, it can cause headaches or vision problems. In rare cases, very high blood pressure can cause seizures (fits). It is important to diagnose and treat hypertension even if there are no symptoms. This is because high blood pressure can cause damage to organs like the heart and kidneys. This damage can be permanent (not go away). That is why it is very important to take all medicines prescribed for your condition.    You should have another blood pressure reading in 2-3 days.      YOU SHOULD SEEK MEDICAL ATTENTION IMMEDIATELY,  EITHER HERE OR AT THE NEAREST EMERGENCY DEPARTMENT IF ANY OF THE FOLLOWING OCCUR:   Your chest hurts or you have trouble breathing.   You have a severe headache or have trouble seeing.   You have seizures.   You vomit (throw up) repeatedly or get more ill.

## 2015-04-20 ENCOUNTER — Encounter (INDEPENDENT_AMBULATORY_CARE_PROVIDER_SITE_OTHER): Payer: Self-pay | Admitting: Student in an Organized Health Care Education/Training Program

## 2015-04-20 ENCOUNTER — Encounter (INDEPENDENT_AMBULATORY_CARE_PROVIDER_SITE_OTHER): Payer: Self-pay | Admitting: Orthopaedic Surgery

## 2015-04-20 ENCOUNTER — Ambulatory Visit (INDEPENDENT_AMBULATORY_CARE_PROVIDER_SITE_OTHER): Payer: Medicaid Other | Admitting: Student in an Organized Health Care Education/Training Program

## 2015-04-20 ENCOUNTER — Ambulatory Visit (INDEPENDENT_AMBULATORY_CARE_PROVIDER_SITE_OTHER): Payer: Medicaid Other | Admitting: Orthopaedic Surgery

## 2015-04-20 VITALS — BP 143/93 | HR 102 | Ht 69.5 in | Wt 157.0 lb

## 2015-04-20 VITALS — BP 141/84 | HR 98 | Temp 98.0°F | Resp 20 | Ht 69.5 in | Wt 157.0 lb

## 2015-04-20 DIAGNOSIS — Z1331 Encounter for screening for depression: Secondary | ICD-10-CM

## 2015-04-20 DIAGNOSIS — Z1389 Encounter for screening for other disorder: Secondary | ICD-10-CM

## 2015-04-20 DIAGNOSIS — M7521 Bicipital tendinitis, right shoulder: Secondary | ICD-10-CM

## 2015-04-20 DIAGNOSIS — I10 Essential (primary) hypertension: Secondary | ICD-10-CM

## 2015-04-20 DIAGNOSIS — M25511 Pain in right shoulder: Secondary | ICD-10-CM | POA: Insufficient documentation

## 2015-04-20 MED ORDER — NAPROXEN 500 MG PO TBEC
500.0000 mg | DELAYED_RELEASE_TABLET | Freq: Two times a day (BID) | ORAL | Status: DC
Start: 2015-04-20 — End: 2015-07-05

## 2015-04-20 MED ORDER — LISINOPRIL-HYDROCHLOROTHIAZIDE 10-12.5 MG PO TABS
1.0000 | ORAL_TABLET | Freq: Every day | ORAL | Status: DC
Start: 2015-04-20 — End: 2015-07-05

## 2015-04-20 NOTE — Progress Notes (Signed)
Patient ID: Michael HICKS Sr.  is a 59 y.o.  male.     New patient here to establish care.    Chief Complaint   Patient presents with   . Shoulder Pain     Pt reports x 2-3 months, worsening over this time period.  Pt unable to put weight on it and range-of-motion painful.   . Immunizations     Flu vaccine, and needs shingles vaccine also.  Patient has never received a flu vaccine, but no egg allergy.      Shoulder Pain   The pain is present in the right shoulder. This is a new problem. The current episode started more than 1 month ago. There has been no history of extremity trauma. The problem occurs constantly. The problem has been unchanged. The quality of the pain is described as aching. Associated symptoms include a limited range of motion. Pertinent negatives include no fever, inability to bear weight, itching, joint locking, joint swelling, stiffness or tingling. The symptoms are aggravated by activity. He has tried nothing for the symptoms. The treatment provided no relief. There is no history of diabetes, gout, osteoarthritis or rheumatoid arthritis.   Seen ED recently for this, XR with subAC spur, no malalignement; prescribed Percocet and ibuprofen, only taking Percocet  Pt works in furniture moving, does heavy lifting frequently      Patient Active Problem List    Diagnosis Date Noted   . SVT (supraventricular tachycardia) 05/18/2013   . Chest pain 04/14/2013   . Essential hypertension 04/14/2013     Off meds for several years    Home BP: does not check at home  Medications: lisinopril-HCTZ 10-12.5mg  (started at recent ED visit)  Side Effects: none  Diet/exercise: does not watch diet, active at work  Smoking: yes  Symptoms: denies CP/SOB/dizziness/HA  EKG:     BP Readings from Last 3 Encounters:   04/20/15 141/84   04/14/15 160/99   05/18/13 145/86            . Syncope and collapse 04/14/2013   . Nicotine abuse 04/14/2013       The following portions of the patient's history were reviewed and updated as  appropriate: current medications, allergies, past family history, past medical history, past social history, past surgical history and problem list.     PAST MEDICAL HISTORY:  Past Medical History   Diagnosis Date   . SVT (supraventricular tachycardia)    . Syncope    . Hypertension        PAST SURGICAL HISTORY:  Past Surgical History   Procedure Laterality Date   . Cardiac ablation  04/15/13       FAMILY HISTORY:  No family history on file.    SOCIAL HISTORY:  Social History     Social History   . Marital Status: Legally Separated     Spouse Name: N/A   . Number of Children: N/A   . Years of Education: N/A     Social History Main Topics   . Smoking status: Current Every Day Smoker   . Smokeless tobacco: None   . Alcohol Use: Yes      Comment: beer   . Drug Use: No   . Sexual Activity: Not Asked     Other Topics Concern   . None     Social History Narrative       ALLERGIES:  No Known Allergies    Review of Systems   Constitutional: Negative for fever,  chills, weight loss and malaise/fatigue.   Eyes: Negative for blurred vision.   Respiratory: Negative for cough and shortness of breath.    Cardiovascular: Negative for chest pain, palpitations and orthopnea.   Gastrointestinal: Negative for abdominal pain and blood in stool.   Musculoskeletal: Negative for myalgias, joint pain, stiffness and gout.        Shoulder pain   Skin: Negative for itching and rash.   Neurological: Negative for dizziness, tingling, sensory change, focal weakness and headaches.   Psychiatric/Behavioral: Negative for depression. The patient is not nervous/anxious.    All other systems reviewed and are negative.        OBJECTIVE     BP 141/84 mmHg  Pulse 98  Temp(Src) 98 F (36.7 C) (Oral)  Resp 20  Ht 1.765 m (5' 9.5")  Wt 71.215 kg (157 lb)  BMI 22.86 kg/m2    Constitutional:  Well appearing male  Eyes:  EOMI, PERRL  ENT:  External ears, nose, and throat normal  Neck:  Supple, no masses  Lymphatic:  No cervical lymphadenopathy  noted  Respiratory:  Respiratory effort unlabored, CTAB, no w/r/r  Cardiovascular:  regular rate and rhythm, no m/g/r, noted no edema  Chest/Breast: No chest wall tenderness noted to palpation  GI/Abdomen:  Soft, non-tender to palpation, Bowel sounds normoactive.  No hepatosplenomegaly present.  Genito-urinary:  No suprapubic tenderness noted, no CVA tenderness noted.  Musculoskeletal:  Right shoulder - tenderness along bicipital groove and anterior GH joint, no redness/swelling, decrease internal rotation, pain with 90 degree abduction; Extremities are well perfused, Extremities with full ROM.  Back non-tender to palpation.  Skin:  No significant rash No significant nodules  Neurological:  Neurologically intact, DTRs 2+/4 upper and lower extremities.  Psychological:  Mood and affect are normal, appropriate in interactions.      ASSESSMENT      Michael BERTOLI Sr. is a 59 y.o. male with   1. Bicipital tendinitis, right shoulder     2. Essential hypertension  lisinopril-hydrochlorothiazide (ZESTORETIC) 10-12.5 MG per tablet   3. Depression screen            PLAN       1. Bicipital tendinitis, right shoulder  - RICE, limited course of NSAIDs, uses analgesics sparingly as needed, follow up with ortho, has appointment with Dr. Cherly Hensen this afternoon, cannot r/o RTC injury    2. Essential hypertension  - borderline, recently started on dual trx, cont regimen  - Encourage DASH diet and regular exercise, keep home BP log, call if elevated   - lisinopril-hydrochlorothiazide (ZESTORETIC) 10-12.5 MG per tablet; Take 1 tablet by mouth daily.  Dispense: 30 tablet; Refill: 0    3. Depression screen  Negative    F/u 1 month, CPE fasting labs      Medication list reviewed with patient and updated as indicated.  Risk & Benefits of any new medication(s) were explained to the patient who verbalized understanding & agreed to the treatment plan.  Patient given a printed copy of AVS. Patient verbalized understanding of instructions  given.

## 2015-04-20 NOTE — Patient Instructions (Signed)
Tendonitis  A tendon is the thick fibrous cord that joins muscle to bone and causes joints to move. Tendonitis is inflammation of the tendon which may be due to overuse, injury or infection. This usually involves the shoulders, forearm, wrist, hands and foot. Symptoms include local pain, swelling and tenderness to the touch. Movement of the involved joint increases the pain.  Tendonitis requires about 4 to 6 weeks to heal. It is treated by preventing motion of the tendon with a splint or brace and use of anti-inflammatory medicine.  Home Care:   Apply an ice pack (ice cubes in a plastic bag, wrapped in a towel) over the injured area for 20 minutes every 1-2 hours the first day for pain relief. Continue this 3-4 times a day until the pain and swelling goes away.   Rest the inflamed joint and protect it from movement.   You may use ibuprofen (Motrin, Advil) or naproxen (Aleve, Naprosyn) to treat pain and inflammation, unless another medicine was prescribed. If you can't take these medicines, acetaminophen (Tylenol) may help with the pain, but does not treat inflammation. [NOTE : If you have chronic liver or kidney disease or ever had a stomach ulcer or GI bleeding, talk with your doctor before using these medicines.]   As your symptoms improve, begin gradual motion at the involved joint.  Follow Up With Your Doctor If Not Improving After The First Five Days Of Treatment.  Get Prompt Medical Attention If Any Of The Following Occur:   Redness over the painful area   Increasing pain or swelling at the joint   Fever of 100.22F (38C) or higher, or as directed by your healthcare provider   2000-2015 [employer] [facility]. [address] [place], [place] [address]. All rights reserved. This information is not intended as a substitute for professional medical care. Always follow your healthcare professional's instructions.      Eating Heart-Healthy Food: Using the DASH Plan    Eating for your heart doesn't have  to be hard or boring. You just need to know how to make healthier choices. The DASH eating plan has been developed to help you do just that. DASH stands for Dietary Approaches to Stop Hypertension. It is a plan that has been proven to be healthier for your heart and to lower your risk for high blood pressure. It can also help lower your risk for cancer, heart disease, osteoporosis, and diabetes.    Choosing from each food group  Choose foods from each of the food groups below each day. Try to get the recommended number of servings for each food group. The serving numbers are based on a diet of 2,000 calories a day.Talk to your doctor if you're unsure about your calorie needs. Along with getting the correct servings, the DASH plan also recommends a sodium intake less than 2,300 mg per day.                 [address] [employer] [address] [place], [place] [address]. All rights reserved. This information is not intended as a substitute for professional medical care. Always follow your healthcare professional's instructions.          Exercise for a Healthier Heart  You may wonder how you can improve the health of your heart. If you're thinking about exercise, you're on the right track. You don't need to become an athlete, but you do need a certain amount of brisk exercise to help strengthen your  heart. If you have been diagnosed with a heart condition, your doctor may recommend exercise to help stabilize your condition. To help make exercise a habit, choose safe, fun activities.       Why exercise?  Exercising regularly offers many healthy rewards. It can help you do all of the following:   Improve your blood cholesterol levels to help prevent further heart trouble   Lower your blood pressure to help prevent a stroke or heart attack   Control diabetes, or reduce your risk of getting this disease   Improve your heart and lung function   Reach and maintain a healthy weight   Make your  muscles stronger and more limber so you can stay active   Prevent falls and fractures by slowing the loss of bone mass (osteoporosis)   Manage stress better    Exercise tips    Ease into your routine. Set small goals. Then build on them.    Exercise on most days. Aim for a total of 150 or more minutes of moderate to vigorous intensity activity each week. Consider 40 minutes, 3 to 4 times a week. For best results, activity should last for 40 minutes on average. It is OK to work up to the 40 minute period over time. Examples of moderate-intensity activity is walkingone mile in 15 minutes or 30 to 45 minutes of yard work.    Step up your daily activity level. Along with your exercise program, try being more active throughout the day. Walk instead of drive. Do more household tasks or yard work.    Choose one or more activities you enjoy. Walking is one of the easiest things you can do. You can also try swimming, riding a bike, or taking an exercise class.     [address] [employer] [address], [place], [place] [address]. All rights reserved. This information is not intended as a substitute for professional medical care. Always follow your healthcare professional's instructions.

## 2015-04-20 NOTE — Progress Notes (Signed)
Chief complaint: Right shoulder pain.      History of present illness: Michael INTERRANTE Sr. is a(n) 59 y.o. year old Right Hand Dominant male who presents with a chief complaint of Right shoulder pain that began 2 months ago.    This happened insidiously.    The pain is located anterior and lateral and is described as sharp. He rates the pain at 6 out of 10 and this increases with overhead activities and at night.  The pain is relieved by rest.     The patient moves furniture for a living and has a hard time working because of his shoulder pain    Past Medical History   Diagnosis Date   . SVT (supraventricular tachycardia)    . Syncope    . Hypertension      Past Surgical History   Procedure Laterality Date   . Cardiac ablation  04/15/13     Current Outpatient Prescriptions on File Prior to Visit   Medication Sig Dispense Refill   . [facility] NTS STEP 1 21 MG/24HR PLACE 1 PATCH ONTO THE SKIN DAILY. 28 each 1   . ibuprofen (ADVIL,MOTRIN) 600 MG tablet Take 1 tablet (600 mg total) by mouth every 8 (eight) hours as needed for Pain or Fever. 20 tablet 0   . lisinopril-hydrochlorothiazide (ZESTORETIC) 10-12.5 MG per tablet Take 1 tablet by mouth daily. 30 tablet 0   . nicotine (NICODERM CQ) 14 MG/24HR Place 1 patch onto the skin daily. 28 patch 0   . oxyCODONE-acetaminophen (PERCOCET) 5-325 MG per tablet Take 1 tablet by mouth every 6 (six) hours as needed for Pain. 15 tablet 0   . [DISCONTINUED] lisinopril-hydrochlorothiazide (PRINZIDE,ZESTORETIC) 10-12.5 MG per tablet Take 1 tablet by mouth daily. 30 tablet 3   . [DISCONTINUED] lisinopril-hydrochlorothiazide (ZESTORETIC) 10-12.5 MG per tablet Take 1 tablet by mouth daily. 20 tablet 0   . [DISCONTINUED] sildenafil (VIAGRA) 100 MG tablet Take 1 tablet (100 mg total) by mouth daily as needed for Erectile Dysfunction. 20 tablet 0     No current facility-administered medications on file prior to visit.     Social History     Social History   . Marital Status: Legally Separated      Spouse Name: N/A   . Number of Children: N/A   . Years of Education: N/A     Occupational History   . Not on file.     Social History Main Topics   . Smoking status: Current Every Day Smoker   . Smokeless tobacco: Not on file   . Alcohol Use: Yes      Comment: beer   . Drug Use: No   . Sexual Activity: Not on file     Other Topics Concern   . Not on file     Social History Narrative     No family history on file.  No Known Allergies    Review of systems: History obtained from chart review and the patient.  General: negative for chills, fever or night sweats  Hematological and Lymphatic: negative for bleeding problems, jaundice or pallor  Endocrine: negative for hot flashes, malaise/lethargy or mood swings  Respiratory: negative for cough, shortness of breath or wheezing  Cardiovascular: negative for chest pain, LOC or SOB  Gastrointestinal: negative for abdominal pain, appetite loss or nausea/vomiting  Musculoskeletal: positive for shoulder pain as listed in the history of present illness.    PHYSICAL EXAM  Filed Vitals:    04/20/15 1432  BP: 143/93   Pulse: 102   Height: 1.765 m (5' 9.5")   Weight: 71.215 kg (157 lb)    Body mass index is 22.86 kg/(m^2).    Michael Haggard Sr. is a well appearing male who appears his stated age and in no acute distress.  Neck: Supple, no lymphadenopathy  Chest: Symmetric rise and fall, no labored breathing  CV: regular rate and rhythm via radial pulse examination  Abdomen: Soft nontender, nondistended  Neuro: Cranial nerves 2-12 grossly intact    Examination of unaffected (left) shoulder reveals full active and passive range of motion without pain.  The patient has a negative Neer's, Hawkins', and O'Brien's test and 5/5 strength in all planes.  Sensation is intact to light touch throughout the upper extremity and the brachial pulse is 2+.  There is no swelling or areas of tenderness.    Examination of affected (right) shoulder reveals active forward flexion of 170 degrees,  external rotation to 45 degrees, internal rotation to pelvis. With the arm in the ABER position, external rotation was measured at 80 degrees and internal rotation was 45 degrees. Passive motion revealed forward flexion of 180 degrees, external rotation to 45 degrees, internal rotation to lumbar spine. He complains of tenderness to palpation over the biceps tendon and no tenderness to palpation over the [facility] Joint.  Manual Muscle Testing (scale of 1-5): Supraspinatus: 4, internal rotation: 5, external rotation: 5, abduction 5, elbow flexion 5, elbow extension 5. Provocative testing revealed positive Neer and hawkins and negative cross body adduction. Anterior load and shift was grade 1, posterior load and shift was grade 1 and the sulcus sign was negative. Light touch sensation intact.    NECK/CERVICAL SPINE: There is no pain with forward flexion of the cervical spine and is no pain with extension. The patient has no tenderness to palpation along the spinous processes and paramedian musculature of the neck. Spurling's test is negative.       ELBOW: Elbow range of motion reveals 130 degrees of flexion, 0 degrees of extension and there is no tenderness to palpation about the elbow.     Xrays performed at [facility] of the right shoulder demonstrate no obvious fracture, degenerative changes or osseous abnormality.    My impression is that Michael Haggard Sr. has right shoulder rotator cuff tendinitis, possible tear.  We had a discussion with Michael Haggard Sr. about this diagnosis as well as the treatment options as well as the risks and benefits of each of these options.  At this time the plan is to proceed with an MRI of his right shoulder to get a better handle on the diagnosis.  I will see him back once he has a study performed.  All questions were answered.       No referring provider defined for this encounter.

## 2015-04-20 NOTE — Progress Notes (Signed)
Have you seen any new specialists/physicians since you were last here?  Yes, pt saw Dr. Lind Guest on Friday (in our building)  Seeing Orthopedist today at 2:15pm for the RT shoulder pain        Limb alert protocol reviewed?  Yes or No  Yes

## 2015-04-21 ENCOUNTER — Emergency Department: Payer: Medicaid Other

## 2015-04-21 ENCOUNTER — Emergency Department
Admission: EM | Admit: 2015-04-21 | Discharge: 2015-04-21 | Disposition: A | Discharge status: Home / Self Care | Payer: Medicaid Other | Attending: Emergency Medicine | Admitting: Emergency Medicine

## 2015-04-21 DIAGNOSIS — F17200 Nicotine dependence, unspecified, uncomplicated: Secondary | ICD-10-CM | POA: Insufficient documentation

## 2015-04-21 DIAGNOSIS — M545 Low back pain, unspecified: Secondary | ICD-10-CM

## 2015-04-21 DIAGNOSIS — M85872 Other specified disorders of bone density and structure, left ankle and foot: Secondary | ICD-10-CM | POA: Insufficient documentation

## 2015-04-21 DIAGNOSIS — I10 Essential (primary) hypertension: Secondary | ICD-10-CM | POA: Insufficient documentation

## 2015-04-21 DIAGNOSIS — M549 Dorsalgia, unspecified: Secondary | ICD-10-CM

## 2015-04-21 DIAGNOSIS — M8580 Other specified disorders of bone density and structure, unspecified site: Secondary | ICD-10-CM

## 2015-04-21 DIAGNOSIS — S6992XA Unspecified injury of left wrist, hand and finger(s), initial encounter: Secondary | ICD-10-CM | POA: Insufficient documentation

## 2015-04-21 DIAGNOSIS — M25511 Pain in right shoulder: Secondary | ICD-10-CM | POA: Insufficient documentation

## 2015-04-21 DIAGNOSIS — Y92411 Interstate highway as the place of occurrence of the external cause: Secondary | ICD-10-CM | POA: Insufficient documentation

## 2015-04-21 DIAGNOSIS — G8929 Other chronic pain: Secondary | ICD-10-CM

## 2015-04-21 MED ORDER — ACETAMINOPHEN-CODEINE #3 300-30 MG PO TABS
2.0000 | ORAL_TABLET | ORAL | Status: DC | PRN
Start: 2015-04-21 — End: 2015-04-21
  Administered 2015-04-21: 2 via ORAL
  Filled 2015-04-21: qty 2

## 2015-04-21 MED ORDER — HYDROCODONE-ACETAMINOPHEN 5-325 MG PO TABS
1.0000 | ORAL_TABLET | Freq: Four times a day (QID) | ORAL | Status: DC | PRN
Start: 2015-04-21 — End: 2015-09-23
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
## 2015-04-26 ENCOUNTER — Ambulatory Visit: Payer: Medicaid Other

## 2015-04-26 DIAGNOSIS — M25811 Other specified joint disorders, right shoulder: Secondary | ICD-10-CM | POA: Insufficient documentation

## 2015-04-26 DIAGNOSIS — M19011 Primary osteoarthritis, right shoulder: Secondary | ICD-10-CM | POA: Insufficient documentation

## 2015-04-26 DIAGNOSIS — M24111 Other articular cartilage disorders, right shoulder: Secondary | ICD-10-CM | POA: Insufficient documentation

## 2015-04-26 DIAGNOSIS — M66821 Spontaneous rupture of other tendons, right upper arm: Secondary | ICD-10-CM | POA: Insufficient documentation

## 2015-04-26 DIAGNOSIS — M65811 Other synovitis and tenosynovitis, right shoulder: Secondary | ICD-10-CM | POA: Insufficient documentation

## 2015-04-26 DIAGNOSIS — M25511 Pain in right shoulder: Secondary | ICD-10-CM | POA: Insufficient documentation

## 2015-05-03 ENCOUNTER — Ambulatory Visit: Payer: Medicaid Other

## 2015-05-04 ENCOUNTER — Other Ambulatory Visit (INDEPENDENT_AMBULATORY_CARE_PROVIDER_SITE_OTHER): Payer: Self-pay | Admitting: Orthopaedic Surgery

## 2015-05-04 ENCOUNTER — Telehealth (INDEPENDENT_AMBULATORY_CARE_PROVIDER_SITE_OTHER): Payer: Self-pay | Admitting: Orthopaedic Surgery

## 2015-05-04 ENCOUNTER — Encounter (INDEPENDENT_AMBULATORY_CARE_PROVIDER_SITE_OTHER): Payer: Self-pay

## 2015-05-04 DIAGNOSIS — M75111 Incomplete rotator cuff tear or rupture of right shoulder, not specified as traumatic: Secondary | ICD-10-CM

## 2015-05-04 NOTE — Telephone Encounter (Signed)
Dr.Chang Please call patient to discuss MRI findings. This is his 4th phone call this week to discuss the findings. His phone number is [phone].    Thank you!

## 2015-05-08 DIAGNOSIS — M75111 Incomplete rotator cuff tear or rupture of right shoulder, not specified as traumatic: Secondary | ICD-10-CM | POA: Insufficient documentation

## 2015-05-08 MED ORDER — OXYCODONE-ACETAMINOPHEN 5-325 MG PO TABS
1.0000 | ORAL_TABLET | Freq: Four times a day (QID) | ORAL | Status: DC | PRN
Start: 2015-05-08 — End: 2015-09-23

## 2015-05-19 ENCOUNTER — Ambulatory Visit (INDEPENDENT_AMBULATORY_CARE_PROVIDER_SITE_OTHER): Payer: Medicaid Other | Admitting: Student in an Organized Health Care Education/Training Program

## 2015-05-26 ENCOUNTER — Ambulatory Visit (INDEPENDENT_AMBULATORY_CARE_PROVIDER_SITE_OTHER): Payer: Medicaid Other | Admitting: Student in an Organized Health Care Education/Training Program

## 2015-07-04 ENCOUNTER — Telehealth (INDEPENDENT_AMBULATORY_CARE_PROVIDER_SITE_OTHER): Payer: Self-pay | Admitting: Student in an Organized Health Care Education/Training Program

## 2015-07-04 DIAGNOSIS — M75111 Incomplete rotator cuff tear or rupture of right shoulder, not specified as traumatic: Secondary | ICD-10-CM

## 2015-07-04 DIAGNOSIS — I10 Essential (primary) hypertension: Secondary | ICD-10-CM

## 2015-07-04 NOTE — Telephone Encounter (Signed)
Patient called, he needs a refill of Lisinopril, Naprasyn, Hydrocodone, Cyclobenzatrine, Percocet and Ibuprofen- please call patient once complete.

## 2015-07-05 MED ORDER — NAPROXEN 500 MG PO TBEC
500.0000 mg | DELAYED_RELEASE_TABLET | Freq: Two times a day (BID) | ORAL | Status: DC
Start: 2015-07-05 — End: 2015-09-06

## 2015-07-05 MED ORDER — LISINOPRIL-HYDROCHLOROTHIAZIDE 10-12.5 MG PO TABS
1.0000 | ORAL_TABLET | Freq: Every day | ORAL | Status: DC
Start: 2015-07-05 — End: 2015-09-06

## 2015-07-05 NOTE — Telephone Encounter (Addendum)
Approved limited refill of HCTZ/lisinopril and naproxen.  Will have to eval in clinic at schedule appt for other refills.

## 2015-07-05 NOTE — Addendum Note (Signed)
Addended by: Erasmo Score on: 07/05/2015 07:59 AM     Modules accepted: Orders, Medications

## 2015-07-05 NOTE — Telephone Encounter (Signed)
Patient made aware that two of scripts were approved, he will need to be evaluated in the clinic for the other medications. Patient states he has an appointment on 07/11/2015

## 2015-07-11 ENCOUNTER — Encounter (INDEPENDENT_AMBULATORY_CARE_PROVIDER_SITE_OTHER): Payer: Medicaid Other | Admitting: Student in an Organized Health Care Education/Training Program

## 2015-09-05 ENCOUNTER — Telehealth (INDEPENDENT_AMBULATORY_CARE_PROVIDER_SITE_OTHER): Payer: Self-pay | Admitting: Student in an Organized Health Care Education/Training Program

## 2015-09-05 DIAGNOSIS — I10 Essential (primary) hypertension: Secondary | ICD-10-CM

## 2015-09-05 DIAGNOSIS — M75111 Incomplete rotator cuff tear or rupture of right shoulder, not specified as traumatic: Secondary | ICD-10-CM

## 2015-09-05 NOTE — Telephone Encounter (Signed)
Patient called, he needs refills of Lisinopril and Naproxen sent to the pharmacy on file - pharmacy verified.

## 2015-09-06 MED ORDER — NAPROXEN 500 MG PO TBEC
500.0000 mg | DELAYED_RELEASE_TABLET | Freq: Two times a day (BID) | ORAL | Status: DC
Start: 2015-09-06 — End: 2015-09-23

## 2015-09-06 MED ORDER — LISINOPRIL-HYDROCHLOROTHIAZIDE 10-12.5 MG PO TABS
1.0000 | ORAL_TABLET | Freq: Every day | ORAL | Status: DC
Start: 2015-09-06 — End: 2016-08-28

## 2015-09-06 NOTE — Telephone Encounter (Signed)
Approved 1 month refill, please make him f/u appointment, let him know no additional refills until seen in clinic.

## 2015-09-06 NOTE — Addendum Note (Signed)
Addended by: Erasmo Score on: 09/06/2015 08:42 AM     Modules accepted: Orders, Medications

## 2015-09-12 NOTE — Telephone Encounter (Signed)
Left message for pt to return call.

## 2015-09-23 ENCOUNTER — Emergency Department: Payer: Medicaid Other

## 2015-09-23 ENCOUNTER — Emergency Department
Admission: EM | Admit: 2015-09-23 | Discharge: 2015-09-23 | Disposition: A | Discharge status: Home / Self Care | Payer: Medicaid Other | Attending: Emergency Medicine | Admitting: Emergency Medicine

## 2015-09-23 DIAGNOSIS — T148 Other injury of unspecified body region: Secondary | ICD-10-CM | POA: Insufficient documentation

## 2015-09-23 DIAGNOSIS — Z23 Encounter for immunization: Secondary | ICD-10-CM | POA: Insufficient documentation

## 2015-09-23 DIAGNOSIS — F17200 Nicotine dependence, unspecified, uncomplicated: Secondary | ICD-10-CM | POA: Insufficient documentation

## 2015-09-23 DIAGNOSIS — T148XXA Other injury of unspecified body region, initial encounter: Secondary | ICD-10-CM

## 2015-09-23 DIAGNOSIS — S0083XA Contusion of other part of head, initial encounter: Secondary | ICD-10-CM

## 2015-09-23 DIAGNOSIS — I10 Essential (primary) hypertension: Secondary | ICD-10-CM | POA: Insufficient documentation

## 2015-09-23 DIAGNOSIS — S01511A Laceration without foreign body of lip, initial encounter: Secondary | ICD-10-CM | POA: Insufficient documentation

## 2015-09-23 DIAGNOSIS — Z79899 Other long term (current) drug therapy: Secondary | ICD-10-CM | POA: Insufficient documentation

## 2015-09-23 MED ORDER — DIAZEPAM 5 MG PO TABS
5.0000 mg | ORAL_TABLET | Freq: Three times a day (TID) | ORAL | Status: DC | PRN
Start: 2015-09-23 — End: 2016-08-28

## 2015-09-23 MED ORDER — FENTANYL CITRATE (PF) 50 MCG/ML IJ SOLN (WRAP)
50.0000 ug | Freq: Once | INTRAMUSCULAR | Status: AC
Start: 2015-09-23 — End: 2015-09-23
  Administered 2015-09-23: 50 ug via NASAL
  Filled 2015-09-23: qty 2

## 2015-09-23 MED ORDER — IBUPROFEN 400 MG PO TABS
800.0000 mg | ORAL_TABLET | Freq: Once | ORAL | Status: AC
Start: 2015-09-23 — End: 2015-09-23
  Administered 2015-09-23: 800 mg via ORAL
  Filled 2015-09-23: qty 2

## 2015-09-23 MED ORDER — IBUPROFEN 800 MG PO TABS
800.0000 mg | ORAL_TABLET | Freq: Three times a day (TID) | ORAL | Status: DC | PRN
Start: 2015-09-23 — End: 2019-10-04

## 2015-09-23 MED ORDER — TETANUS-DIPHTH-ACELL PERTUSSIS 5-2.5-18.5 LF-MCG/0.5 IM SUSP
0.5000 mL | Freq: Once | INTRAMUSCULAR | Status: AC
Start: 2015-09-23 — End: 2015-09-23
  Administered 2015-09-23: 0.5 mL via INTRAMUSCULAR
  Filled 2015-09-23: qty 0.5

## 2015-09-23 MED ORDER — DIAZEPAM 5 MG PO TABS
5.0000 mg | ORAL_TABLET | Freq: Once | ORAL | Status: AC
Start: 2015-09-23 — End: 2015-09-23
  Administered 2015-09-23: 5 mg via ORAL
  Filled 2015-09-23: qty 1
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
## 2015-09-23 NOTE — Discharge Instructions (Signed)
Laceration, Sutures    You have been treated for a laceration (cut).    Follow up with your doctor OR come back here OR go to the nearest Emergency Department to have your sutures (stitches) taken out. Sutures should be taken out in:   3-5 days.    Use the following wound care instructions:   Keep the wound clean and dry for the next 24 hours. You can wash the wound gently with soap and water.   DO NOT allow your wound to soak in water (don't do the dishes or go swimming, for example). You can shower, but do not rub your stitches too hard. Let the wound dry before putting another bandage on.   Take off old dressings every day. Then put on a clean, dry dressing.   If the bandage sticks to the wound, slightly moisten it with water. This way, it can come off more easily.   You can wash the wound gently with soap and water. To help remove a scab, cleanse the area with a mixture of half hydrogen peroxide and half water. This will also help us to take out the sutures later.   Allow the area to dry completely before putting on a new bandage.   Unless you receive instructions not to do so, you can place a thin layer of antibiotic ointment over the wound. You can buy Polysporin, Bacitracin, or Neosporin at the store. Neosporin can sometimes cause irritation to your skin. If this happens, stop using it and switch to another topical (surface) antibiotic.   If needed, put a clean, dry bandage over the wound to protect it.    Keep the affected area elevated (lifted) for the next 24 hours. This will decrease swelling and pain. You may also want to put ice on the area. By applying ice to the affected area, swelling and pain can be reduced. Place some ice cubes in a re-sealable (Ziploc) bag and add some water. Put a thin washcloth between the bag and the skin. Apply the ice bag to the area for at least 20 minutes. Do this at least 4 times per day. It is OK to use the ice more frequently and for longer periods of  time. DO NOT APPLY ICE DIRECTLY TO THE SKIN!    If you had a local anesthetic, it will wear off in about 2 hours. Until then, be careful not to hurt yourself because of having less feeling in the area.    YOU SHOULD SEEK MEDICAL ATTENTION IMMEDIATELY, EITHER HERE OR AT THE NEAREST EMERGENCY DEPARTMENT, IF ANY OF THE FOLLOWING OCCURS:   You see redness or swelling.   There are red streaks going up from the injured area.   The wound smells bad or has a lot of drainage.   You have fever (temperature higher than 100.4F / 38C), chills, worse pain and / or swelling.
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
## 2015-09-25 LAB — ECG 12-LEAD
Atrial Rate: 91 {beats}/min
P Axis: 66 degrees
P-R Interval: 156 ms
Q-T Interval: 364 ms
QRS Duration: 74 ms
QTC Calculation (Bezet): 447 ms
R Axis: -74 degrees
T Axis: 26 degrees
Ventricular Rate: 91 {beats}/min

## 2015-10-31 ENCOUNTER — Other Ambulatory Visit (INDEPENDENT_AMBULATORY_CARE_PROVIDER_SITE_OTHER): Payer: Self-pay | Admitting: Student in an Organized Health Care Education/Training Program

## 2015-10-31 DIAGNOSIS — I10 Essential (primary) hypertension: Secondary | ICD-10-CM

## 2016-08-02 DIAGNOSIS — M545 Low back pain: Secondary | ICD-10-CM | POA: Diagnosis not present

## 2016-08-02 DIAGNOSIS — Z6830 Body mass index (BMI) 30.0-30.9, adult: Secondary | ICD-10-CM | POA: Diagnosis not present

## 2016-08-02 DIAGNOSIS — Z79899 Other long term (current) drug therapy: Secondary | ICD-10-CM | POA: Diagnosis not present

## 2016-08-02 DIAGNOSIS — G2581 Restless legs syndrome: Secondary | ICD-10-CM | POA: Diagnosis not present

## 2016-08-02 DIAGNOSIS — I10 Essential (primary) hypertension: Secondary | ICD-10-CM | POA: Diagnosis not present

## 2016-08-02 DIAGNOSIS — J449 Chronic obstructive pulmonary disease, unspecified: Secondary | ICD-10-CM | POA: Diagnosis not present

## 2016-08-20 ENCOUNTER — Ambulatory Visit (INDEPENDENT_AMBULATORY_CARE_PROVIDER_SITE_OTHER): Payer: Self-pay | Admitting: Cardiovascular Disease

## 2016-08-20 ENCOUNTER — Encounter (INDEPENDENT_AMBULATORY_CARE_PROVIDER_SITE_OTHER): Payer: Self-pay | Admitting: Cardiovascular Disease

## 2016-08-20 DIAGNOSIS — Z9889 Other specified postprocedural states: Secondary | ICD-10-CM | POA: Insufficient documentation

## 2016-08-20 NOTE — Progress Notes (Deleted)
[place] [facility] OFFICE PROGRESS NOTE       I had the pleasure of seeing [name] today.    Michael Haggard Sr. is a very pleasant 61 y.o. *** male        VISIT DIAGNOSIS:  1. SVT (supraventricular tachycardia)    2. H/O cardiac radiofrequency ablation              SUBJECTIVE:    ***    November 2014:***  Michael Allende. is a 61 y.o. male with a history of tachycardia who presented to the hospital on 04/14/2013 with syncope.  Patient reports that last night he had a sensation of tachycardia, dizziness, CP, and diaphoresis.  It lasted for about 10 minutes and then resolved.  30 seconds later he passed out.  He quickly came to and was aware of his surroundings.  On Sunday he was walking back to his house and had a similar episode but was SOB as well.  After about 10 minutes he felt better but then had a syncopal episode.  He has had about 25 episodes of symptoms over the past 15 years and of those, he has passed out about 10 times.  In March of 2013 he was at the [facility] and had a 12 lead documenting PAT with a heart rate of 209.  He has never had a stress test or echocardiogram.  He has gone to the ER previously but never had a workup.  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 ***      On 04/15/13, he underwent ablation of typical AVNRT, slow pathway ablation        Patient Active Problem List   Diagnosis   . Chest pain   . Essential hypertension   . Syncope and collapse   . Nicotine abuse   . SVT (supraventricular tachycardia)   . Acute pain of right shoulder   . Partial tear of right rotator cuff   . H/O cardiac radiofrequency ablation       Past Medical History:   Diagnosis Date   . Hypertension    . SVT (supraventricular tachycardia)    . Syncope          MEDICATIONS:  Outpatient Encounter Prescriptions as of 08/20/2016   Medication Sig Dispense Refill   . diazePAM (VALIUM) 5 MG tablet Take 1 tablet (5 mg total) by mouth every 8  (eight) hours as needed (discomfort). 6 tablet 0   . ibuprofen (ADVIL,MOTRIN) 800 MG tablet Take 1 tablet (800 mg total) by mouth every 8 (eight) hours as needed for Pain. 30 tablet 0   . lisinopril-hydroCHLOROthiazide (ZESTORETIC) 10-12.5 MG per tablet Take 1 tablet by mouth daily. 30 tablet 0     No facility-administered encounter medications on file as of 08/20/2016.            REVIEW OF SYSTEMS:     ***    All other systems reviewed and negative except as stated above            SOCIAL HISTORY:   ***        CORONARY RISK FACTORS:  {CORONARY RISK FACTORS:[id number]}            FAMILY HISTORY:  ***            ALLERGIES:   No Known Allergies        PHYSICAL EXAM:  There were no vitals  taken for this visit.  There is no height or weight on file to calculate BMI.  General:  Well-appearing, alert, cooperative, communicative  Chest- clear   Cor- RR  Normal S1 and S2.  No S3, No S4  No Murmur   Neck- Supple, no JVD  Extremities- no edema  Neuro: Grossly non-focal  Oropharynx:  Mouth- mucus membranes moist  Skin turgor is normal  Eyes:  Sclera are non-icteric  Abdomen: soft, non-tender  Carotids: brisk bilaterally without bruit or transmitted murmur  Pedal Pulses:  2+ bilateral      EKG 08/20/2016 :  ***        CARDIAC STUDIES:    ***    Lexiscan nuclear scan  04/15/2013 :       Baseline EKG:    NSR, Q in V2, brief runs of AIVR (LBBB configuration)  No significant ST changes with infusion   No chest pain  + Occasional PVC  HR went up to about 125 (sinus) with infusion  Normal perfusion study without evidence of ischemia or scarring  Subdiaphragmatic attenuation is noted  Basal septal wall hypokinesis, ejection fraction 53%   ***    Echo 04/15/13:  LVEF 55-60%  Grade I LV diastolic dysfunction  Aortic valve sclerosis  Mild mitral valve thickening and calcification  RV pressure 20 mmHg            LABS:  ***        IMPRESSION:    Michael Haggard Sr. is a very pleasant 61 y.o.male    ***    Supraventricular tachycardia  Status  post ablation in November 2014    Hypertension    Chest pain***            PLAN:    ***      No orders of the defined types were placed in this encounter.          08/20/2016  7:58 AM  Darrol Poke, MD, [facility]  Office: [phone]

## 2016-08-21 ENCOUNTER — Ambulatory Visit (INDEPENDENT_AMBULATORY_CARE_PROVIDER_SITE_OTHER): Payer: Medicaid Other | Admitting: Student in an Organized Health Care Education/Training Program

## 2016-08-28 ENCOUNTER — Encounter (INDEPENDENT_AMBULATORY_CARE_PROVIDER_SITE_OTHER): Payer: Self-pay | Admitting: Cardiovascular Disease

## 2016-08-28 ENCOUNTER — Ambulatory Visit (INDEPENDENT_AMBULATORY_CARE_PROVIDER_SITE_OTHER): Payer: Self-pay | Admitting: Cardiovascular Disease

## 2016-08-28 VITALS — BP 136/70 | HR 82 | Ht 71.0 in | Wt 156.2 lb

## 2016-08-28 DIAGNOSIS — I491 Atrial premature depolarization: Secondary | ICD-10-CM

## 2016-08-28 DIAGNOSIS — I499 Cardiac arrhythmia, unspecified: Secondary | ICD-10-CM

## 2016-08-28 DIAGNOSIS — I10 Essential (primary) hypertension: Secondary | ICD-10-CM

## 2016-08-28 DIAGNOSIS — I493 Ventricular premature depolarization: Secondary | ICD-10-CM

## 2016-08-28 NOTE — Patient Instructions (Addendum)
Irregular heart beats are from PACs (premature atrial contractions) and PVCs (premature ventricular contraction.    Will have you do an echocardiogram to check the structure and function of your heart.    Will have you wear a 24 hour Holter monitor to check for any arrhythmias.    Will have you do an exercise stress test to check the blood flow to your heart.

## 2016-08-28 NOTE — Progress Notes (Signed)
[place] [facility] PROSPER[facility] OFFICE CONSULTATION     I had the pleasure of seeing [name] today for cardiovascular evaluation. He is a pleasant 61 y.o. 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  He underwent ablation and has had no recurrence of syncope or palpitations.  At that time, echocardiogram and nuclear stress test were both unremarkable.  He has had no cardiac symptoms since that time.  He has been feeling well with no chest pain, dyspnea on exertion, palpitations, bleeding, lower extremity edema, orthopnea, paroxysmal nocturnal dyspnea, or syncope.  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  He is working towards being a Hospital doctor and as part of his DOT physical, and irregular heart beat was heard.  He was referred for evaluation and clearance.  He has a remote history of hypertension on medications but has been off of medications for 2 years.  He checks his blood pressure at [employer] a couple times a month and it runs in the 130s systolic over 70s diastolic.    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 He has a past surgical history that includes CARDIAC ABLATION (04/15/2013).    MEDICATIONS: Motrin PRN    ALLERGIES: He has No Known Allergies.     FAMILY HISTORY: No known premature coronary artery disease or arrhythmias.      SOCIAL HISTORY: He reports that he quit smoking about 4 years ago. He has never used smokeless tobacco. He reports that he drinks alcohol. He reports that he does not use drugs.    REVIEW OF SYSTEMS: All other systems reviewed and negative except as above.     PHYSICAL EXAMINATION  General Appearance:  A well-appearing man in no acute  distress.   Vital Signs: BP 136/70   Pulse 82   Ht 1.803 m (5\' 11" )   Wt 70.9 kg (156 lb 3.2 oz)   BMI 21.79 kg/m    HEENT: Sclera anicteric, conjunctiva without pallor, moist mucous membranes, normal dentition. No arcus.   Neck:  Supple without jugular venous distention. Thyroid nonpalpable. Normal carotid upstrokes without bruits.  Chest: Clear to auscultation bilaterally with good air movement and respiratory effort and no wheezes, rales, or rhonchi   Cardiovascular: Irregular, normal S1 and S2 without murmurs, gallops or rub. PMI of normal size and nondisplaced.   Abdomen: Soft, nontender, nondistended, with normoactive bowel sounds. No organomegaly.  No pulsatile masses, or bruits.   Extremities: Warm without edema, clubbing, or cyanosis. All peripheral pulses are full and equal.   Skin: No rash, xanthoma or xanthelasma.   Neuro: Alert. Grossly intact. Strength is symmetrical. Normal mood and affect.     ECG:  normal sinus rhythm at 83 bpm, LAD/LAFB, normal intervals, non-specific ST/T changes, RAA consistent with pulmonary disease, frequent unifocal blocked PAC, frequent multifocal PVC.    NUCLEAR STRESS TEST: 04/15/13, no ischemia, EF 53%.       ECHOCARDIOGRAM: 04/15/13, normal LV function, EF 55-60%, no significant valvular disease.    IMPRESSION/RECOMMENDATIONS: Michael White is a 61 y.o. gentleman with a history of AVNRT status post ablation and borderline hypertension with normal blood pressures off of medication who presents after an  irregular heart beat was heard during a DOT physical.  He has no cardiovascular symptoms.  ECG shows blocked PACs and multifocal PVCs.  Heart rhythm is irregular on auscultation.  Last cardiac evaluation was in 2014.   Discussed the generally benign nature of premature atrial and ventricular contractions, their pathophysiology and natural history.  Discussed how we could typically follow conservatively without medications because they are usually not clinically relevant,  although rarely they can represent structural heart disease or myocardial ischemia.      Will check a Holter monitor to evaluate for burden of PVCs and other arrhythmias that he could be having.   Will check an echocardiogram to evaluate for structural heart disease.  Will check an exercise stress test to rule out myocardial ischemia as a cause of his PVCs. If testing is unremarkable, will continue with lifestyle changes and risk factor modification for promotion of cardiovascular health.    Will have him follow up after testing to discuss the results and to fill out DOT paperwork.

## 2016-09-04 NOTE — Addendum Note (Signed)
Addended by: Dondra Spry A on: 09/04/2016 10:52 AM     Modules accepted: Orders

## 2016-09-05 ENCOUNTER — Ambulatory Visit
Admission: RE | Admit: 2016-09-05 | Discharge: 2016-09-05 | Disposition: A | Discharge status: Home / Self Care | Payer: Self-pay | Source: Ambulatory Visit | Attending: Cardiovascular Disease | Admitting: Cardiovascular Disease

## 2016-09-05 ENCOUNTER — Ambulatory Visit (INDEPENDENT_AMBULATORY_CARE_PROVIDER_SITE_OTHER): Payer: Self-pay

## 2016-09-05 DIAGNOSIS — I493 Ventricular premature depolarization: Secondary | ICD-10-CM

## 2016-09-05 DIAGNOSIS — I491 Atrial premature depolarization: Secondary | ICD-10-CM

## 2016-09-05 DIAGNOSIS — I5189 Other ill-defined heart diseases: Secondary | ICD-10-CM | POA: Insufficient documentation

## 2016-09-05 NOTE — Procedures (Signed)
Holter Monitor number [id number] was placed on the patient. Detailed instructions given. The patient verbalized understanding.

## 2016-09-11 DIAGNOSIS — I493 Ventricular premature depolarization: Secondary | ICD-10-CM

## 2016-09-11 DIAGNOSIS — I491 Atrial premature depolarization: Secondary | ICD-10-CM

## 2016-09-17 ENCOUNTER — Ambulatory Visit (INDEPENDENT_AMBULATORY_CARE_PROVIDER_SITE_OTHER): Payer: Self-pay | Admitting: Nurse Practitioner

## 2016-09-17 DIAGNOSIS — I10 Essential (primary) hypertension: Secondary | ICD-10-CM

## 2016-09-17 DIAGNOSIS — Z9889 Other specified postprocedural states: Secondary | ICD-10-CM

## 2016-09-17 DIAGNOSIS — I491 Atrial premature depolarization: Secondary | ICD-10-CM

## 2016-09-17 DIAGNOSIS — I493 Ventricular premature depolarization: Secondary | ICD-10-CM

## 2016-09-17 NOTE — Procedures (Signed)
[facility]  Stress Test Procedure Note    Name:  Michael White, Michael SR.   DOB: 09-07-1955      Test Date:  09/17/2016     Interpretation Date: 09/17/2016    Referring Physician:  Venia Carbon, MD   Rendering Physician: Raphael Gibney, MD    Indication:   1. Premature ventricular contractions    2. Premature atrial contractions    3. Essential hypertension    4. H/O cardiac radiofrequency ablation      Resting ECG: Normal sinus rhythm, LAD, normal interval, no significant ST changes, NS T wave changes in inferior leads, PVCs. 77bpm  Blood Pressure:  Rest = 136/64  Maximum = 200/100  Heart Rate:     Rest = 77  Maximum = 180 (114% MPHR).   METS achieved: 10.1    Patient Symptoms: no significant symptoms    Standard Bruce protocol exercise time: 09 minutes, 39 seconds.      Reason for end: fatigue and shortness of breath    Achieved target heart rate: Yes    Ischemic ST changes at peak exercise: No    Arrhythmias: frequent PVCs with couplets, frequent PACs with aberrancy, couplets, and short runs of PAT    Conclusion:  1. No clinical or electrocardiographic evidence of exercise-induced myocardial ischemia at the heart rate achieved.  2. Hypertensive blood pressure response to exercise and above average exercise capacity for age and gender.  3. This study was reviewed by Dr. Raphael Gibney  Signed by: Raphael Gibney, MD  [facility] [place]

## 2016-09-27 ENCOUNTER — Ambulatory Visit (INDEPENDENT_AMBULATORY_CARE_PROVIDER_SITE_OTHER): Payer: Self-pay | Admitting: Cardiovascular Disease

## 2016-09-27 ENCOUNTER — Encounter (INDEPENDENT_AMBULATORY_CARE_PROVIDER_SITE_OTHER): Payer: Self-pay | Admitting: Cardiovascular Disease

## 2016-09-27 VITALS — BP 126/72 | HR 74 | Ht 71.0 in | Wt 156.0 lb

## 2016-09-27 DIAGNOSIS — I493 Ventricular premature depolarization: Secondary | ICD-10-CM

## 2016-09-27 DIAGNOSIS — I491 Atrial premature depolarization: Secondary | ICD-10-CM

## 2016-09-27 DIAGNOSIS — I10 Essential (primary) hypertension: Secondary | ICD-10-CM

## 2016-09-27 NOTE — Progress Notes (Signed)
CARDIOLOGY PROSPERITY OFFICE VISIT    I had the pleasure of seeing Michael White today for cardiovascular evaluation. He is a pleasant 61 y.o. 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  He underwent ablation and has had no recurrence of syncope or palpitations.  At that time, echocardiogram and nuclear stress test were both unremarkable.  He has had no cardiac symptoms since that time.  He has been feeling well with no chest pain, dyspnea on exertion, palpitations, bleeding, lower extremity edema, orthopnea, paroxysmal nocturnal dyspnea, or syncope.  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  He is working towards being a Hospital doctor and as part of his [employer] physical, and irregular heart beat was heard.  He was referred for evaluation and clearance.  He has a remote history of hypertension on medications but has been off of medications for 2 years.  He checks his blood pressure at [facility] a couple times a month and it runs in the 130s systolic over 70s diastolic.  During his visits here his blood pressure has been in the normal range.  Since I last saw him, he has had no symptoms including no chest discomfort or shortness of breath.  He underwent all of his cardiac testing and the results are reassuring.    MEDICATIONS: Motrin PRN    REVIEW OF SYSTEMS: All other systems reviewed and negative except as above.     PHYSICAL EXAMINATION  General Appearance:  A well-appearing man in no acute distress.   Vital Signs: BP 126/72   Pulse 74   Ht 1.803 m (5\' 11" )   Wt 70.8 kg (156 lb)   BMI 21.76 kg/m    HEENT: Sclera anicteric, conjunctiva without pallor, moist mucous membranes, normal dentition. No arcus.   Neck:  Supple without jugular venous distention. Thyroid  nonpalpable. Normal carotid upstrokes without bruits.  Chest: Clear to auscultation bilaterally with good air movement and respiratory effort and no wheezes, rales, or rhonchi   Cardiovascular: Irregular, normal S1 and S2 without murmurs, gallops or rub. PMI of normal size and nondisplaced.   Abdomen: Soft, nontender, nondistended, with normoactive bowel sounds. No organomegaly.  No pulsatile masses, or bruits.   Extremities: Warm without edema, clubbing, or cyanosis. All peripheral pulses are full and equal.   Skin: No rash, xanthoma or xanthelasma.   Neuro: Alert. Grossly intact. Strength is symmetrical. Normal mood and affect.     ECG: 08/28/16, normal sinus rhythm at 83 bpm, LAD/LAFB, normal intervals, non-specific ST/T changes, RAA consistent with pulmonary disease, frequent unifocal blocked PAC, frequent multifocal PVC.    NUCLEAR STRESS TEST: 04/15/13, no ischemia, EF 53%.       ECHOCARDIOGRAM: 04/15/13, normal LV function, EF 55-60%, no significant valvular disease.    ECHOCARDIOGRAM: 09/05/16, normal LV function, EF 55%, no significant valvular disease.    EXERCISE STRESS TEST: 09/17/16, no ischemia.    HOLTER MONITOR: 09/05/16, normal sinus rhythm at 86 bpm (56-124 bpm).  Frequent PACs, about 8% of all beats and occasional PVCs, about 0.6% of all beats.  No significant arrhythmias seen.  No symptoms.    IMPRESSION/RECOMMENDATIONS: Michael White is a 61 y.o. gentleman with a history of AVNRT status post ablation and borderline hypertension  with normal blood pressures off of medication who presents after an irregular heart beat was heard during a [employer] physical.  He has no cardiovascular symptoms.  ECG showed blocked PACs and multifocal PVCs.  Heart rhythm is irregular on auscultation.  Last cardiac evaluation was in 2014.  Repeat testing showed structurally normal heart on echocardiogram, no evidence of ischemia on stress test and no significant arrhythmias on Holter monitor.  He does have frequent PACs and occasional  PVCs but not an undue or concerning number.     Given no concerning findings on evaluation, will continue to conservatively follow premature atrial and ventricular contractions.   There are no restrictions on his activity and he is not undue risk for working for [employer].  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      His blood pressure is normal now.  There is evidence of diastolic dysfunction on echocardiogram.  Given prior history of hypertension, is the most likely cause.  Discussed importance of following blood pressure over time and keeping in the normal range with lifestyle changes her medication as needed.   Would have him return for further follow up on an as-needed basis.

## 2016-11-12 DIAGNOSIS — Z6829 Body mass index (BMI) 29.0-29.9, adult: Secondary | ICD-10-CM | POA: Diagnosis not present

## 2016-11-12 DIAGNOSIS — M545 Low back pain: Secondary | ICD-10-CM | POA: Diagnosis not present

## 2016-11-12 DIAGNOSIS — E785 Hyperlipidemia, unspecified: Secondary | ICD-10-CM | POA: Diagnosis not present

## 2016-11-12 DIAGNOSIS — I10 Essential (primary) hypertension: Secondary | ICD-10-CM | POA: Diagnosis not present

## 2016-11-12 DIAGNOSIS — Z79899 Other long term (current) drug therapy: Secondary | ICD-10-CM | POA: Diagnosis not present

## 2016-11-12 DIAGNOSIS — J449 Chronic obstructive pulmonary disease, unspecified: Secondary | ICD-10-CM | POA: Diagnosis not present

## 2016-12-24 ENCOUNTER — Other Ambulatory Visit: Payer: Self-pay | Admitting: Dermatology

## 2016-12-24 DIAGNOSIS — C44311 Basal cell carcinoma of skin of nose: Secondary | ICD-10-CM | POA: Diagnosis not present

## 2016-12-24 DIAGNOSIS — D485 Neoplasm of uncertain behavior of skin: Secondary | ICD-10-CM | POA: Diagnosis not present

## 2016-12-24 DIAGNOSIS — L570 Actinic keratosis: Secondary | ICD-10-CM | POA: Diagnosis not present

## 2016-12-24 DIAGNOSIS — D225 Melanocytic nevi of trunk: Secondary | ICD-10-CM | POA: Diagnosis not present

## 2016-12-24 DIAGNOSIS — L821 Other seborrheic keratosis: Secondary | ICD-10-CM | POA: Diagnosis not present

## 2017-02-19 DIAGNOSIS — M545 Low back pain: Secondary | ICD-10-CM | POA: Diagnosis not present

## 2017-02-19 DIAGNOSIS — Z79899 Other long term (current) drug therapy: Secondary | ICD-10-CM | POA: Diagnosis not present

## 2017-02-19 DIAGNOSIS — I10 Essential (primary) hypertension: Secondary | ICD-10-CM | POA: Diagnosis not present

## 2017-03-10 DIAGNOSIS — C44311 Basal cell carcinoma of skin of nose: Secondary | ICD-10-CM | POA: Diagnosis not present

## 2017-03-27 DIAGNOSIS — Z6831 Body mass index (BMI) 31.0-31.9, adult: Secondary | ICD-10-CM | POA: Diagnosis not present

## 2017-03-27 DIAGNOSIS — E669 Obesity, unspecified: Secondary | ICD-10-CM | POA: Diagnosis not present

## 2017-03-27 DIAGNOSIS — M545 Low back pain: Secondary | ICD-10-CM | POA: Diagnosis not present

## 2017-03-27 DIAGNOSIS — Z0000 Encounter for general adult medical examination without abnormal findings: Secondary | ICD-10-CM | POA: Diagnosis not present

## 2017-03-27 DIAGNOSIS — Z125 Encounter for screening for malignant neoplasm of prostate: Secondary | ICD-10-CM | POA: Diagnosis not present

## 2017-03-27 DIAGNOSIS — E785 Hyperlipidemia, unspecified: Secondary | ICD-10-CM | POA: Diagnosis not present

## 2017-03-27 DIAGNOSIS — Z136 Encounter for screening for cardiovascular disorders: Secondary | ICD-10-CM | POA: Diagnosis not present

## 2017-04-28 DIAGNOSIS — Z6831 Body mass index (BMI) 31.0-31.9, adult: Secondary | ICD-10-CM | POA: Diagnosis not present

## 2017-04-28 DIAGNOSIS — M545 Low back pain: Secondary | ICD-10-CM | POA: Diagnosis not present

## 2017-04-28 DIAGNOSIS — J449 Chronic obstructive pulmonary disease, unspecified: Secondary | ICD-10-CM | POA: Diagnosis not present

## 2017-05-26 DIAGNOSIS — I82401 Acute embolism and thrombosis of unspecified deep veins of right lower extremity: Secondary | ICD-10-CM | POA: Diagnosis not present

## 2017-05-26 DIAGNOSIS — Z6831 Body mass index (BMI) 31.0-31.9, adult: Secondary | ICD-10-CM | POA: Diagnosis not present

## 2017-05-28 DIAGNOSIS — I82401 Acute embolism and thrombosis of unspecified deep veins of right lower extremity: Secondary | ICD-10-CM | POA: Diagnosis not present

## 2017-05-28 DIAGNOSIS — M545 Low back pain: Secondary | ICD-10-CM | POA: Diagnosis not present

## 2017-05-28 DIAGNOSIS — Z6831 Body mass index (BMI) 31.0-31.9, adult: Secondary | ICD-10-CM | POA: Diagnosis not present

## 2017-05-28 DIAGNOSIS — E785 Hyperlipidemia, unspecified: Secondary | ICD-10-CM | POA: Diagnosis not present

## 2017-05-28 DIAGNOSIS — I10 Essential (primary) hypertension: Secondary | ICD-10-CM | POA: Diagnosis not present

## 2017-05-28 DIAGNOSIS — E119 Type 2 diabetes mellitus without complications: Secondary | ICD-10-CM | POA: Diagnosis not present

## 2017-05-28 DIAGNOSIS — Z79899 Other long term (current) drug therapy: Secondary | ICD-10-CM | POA: Diagnosis not present

## 2017-05-28 DIAGNOSIS — Z125 Encounter for screening for malignant neoplasm of prostate: Secondary | ICD-10-CM | POA: Diagnosis not present

## 2017-05-28 DIAGNOSIS — M7989 Other specified soft tissue disorders: Secondary | ICD-10-CM | POA: Diagnosis not present

## 2017-06-30 DIAGNOSIS — I10 Essential (primary) hypertension: Secondary | ICD-10-CM | POA: Diagnosis not present

## 2017-06-30 DIAGNOSIS — Z1331 Encounter for screening for depression: Secondary | ICD-10-CM | POA: Diagnosis not present

## 2017-06-30 DIAGNOSIS — M545 Low back pain: Secondary | ICD-10-CM | POA: Diagnosis not present

## 2017-06-30 DIAGNOSIS — Z6832 Body mass index (BMI) 32.0-32.9, adult: Secondary | ICD-10-CM | POA: Diagnosis not present

## 2017-07-31 DIAGNOSIS — M545 Low back pain: Secondary | ICD-10-CM | POA: Diagnosis not present

## 2017-07-31 DIAGNOSIS — Z5181 Encounter for therapeutic drug level monitoring: Secondary | ICD-10-CM | POA: Diagnosis not present

## 2017-07-31 DIAGNOSIS — Z6832 Body mass index (BMI) 32.0-32.9, adult: Secondary | ICD-10-CM | POA: Diagnosis not present

## 2017-07-31 DIAGNOSIS — Z87891 Personal history of nicotine dependence: Secondary | ICD-10-CM | POA: Diagnosis not present

## 2017-08-28 DIAGNOSIS — E119 Type 2 diabetes mellitus without complications: Secondary | ICD-10-CM | POA: Diagnosis not present

## 2017-08-28 DIAGNOSIS — I10 Essential (primary) hypertension: Secondary | ICD-10-CM | POA: Diagnosis not present

## 2017-08-28 DIAGNOSIS — E785 Hyperlipidemia, unspecified: Secondary | ICD-10-CM | POA: Diagnosis not present

## 2017-08-28 DIAGNOSIS — Z5181 Encounter for therapeutic drug level monitoring: Secondary | ICD-10-CM | POA: Diagnosis not present

## 2017-08-28 DIAGNOSIS — K219 Gastro-esophageal reflux disease without esophagitis: Secondary | ICD-10-CM | POA: Diagnosis not present

## 2017-08-28 DIAGNOSIS — G2581 Restless legs syndrome: Secondary | ICD-10-CM | POA: Diagnosis not present

## 2017-08-28 DIAGNOSIS — M545 Low back pain: Secondary | ICD-10-CM | POA: Diagnosis not present

## 2017-08-28 DIAGNOSIS — Z6832 Body mass index (BMI) 32.0-32.9, adult: Secondary | ICD-10-CM | POA: Diagnosis not present

## 2017-09-29 DIAGNOSIS — Z720 Tobacco use: Secondary | ICD-10-CM | POA: Diagnosis not present

## 2017-09-29 DIAGNOSIS — E669 Obesity, unspecified: Secondary | ICD-10-CM | POA: Diagnosis not present

## 2017-09-29 DIAGNOSIS — M545 Low back pain: Secondary | ICD-10-CM | POA: Diagnosis not present

## 2017-09-29 DIAGNOSIS — Z5181 Encounter for therapeutic drug level monitoring: Secondary | ICD-10-CM | POA: Diagnosis not present

## 2017-09-29 DIAGNOSIS — Z6831 Body mass index (BMI) 31.0-31.9, adult: Secondary | ICD-10-CM | POA: Diagnosis not present

## 2017-10-29 DIAGNOSIS — M545 Low back pain: Secondary | ICD-10-CM | POA: Diagnosis not present

## 2017-10-29 DIAGNOSIS — Z6832 Body mass index (BMI) 32.0-32.9, adult: Secondary | ICD-10-CM | POA: Diagnosis not present

## 2017-10-29 DIAGNOSIS — Z5181 Encounter for therapeutic drug level monitoring: Secondary | ICD-10-CM | POA: Diagnosis not present

## 2017-10-29 DIAGNOSIS — Z720 Tobacco use: Secondary | ICD-10-CM | POA: Diagnosis not present

## 2017-10-29 DIAGNOSIS — E669 Obesity, unspecified: Secondary | ICD-10-CM | POA: Diagnosis not present

## 2017-11-21 DIAGNOSIS — W57XXXA Bitten or stung by nonvenomous insect and other nonvenomous arthropods, initial encounter: Secondary | ICD-10-CM | POA: Diagnosis not present

## 2017-11-21 DIAGNOSIS — L299 Pruritus, unspecified: Secondary | ICD-10-CM | POA: Diagnosis not present

## 2017-11-28 DIAGNOSIS — M545 Low back pain: Secondary | ICD-10-CM | POA: Diagnosis not present

## 2017-11-28 DIAGNOSIS — Z6832 Body mass index (BMI) 32.0-32.9, adult: Secondary | ICD-10-CM | POA: Diagnosis not present

## 2017-11-28 DIAGNOSIS — Z1339 Encounter for screening examination for other mental health and behavioral disorders: Secondary | ICD-10-CM | POA: Diagnosis not present

## 2017-11-28 DIAGNOSIS — Z5181 Encounter for therapeutic drug level monitoring: Secondary | ICD-10-CM | POA: Diagnosis not present

## 2017-11-28 DIAGNOSIS — Z720 Tobacco use: Secondary | ICD-10-CM | POA: Diagnosis not present

## 2017-11-28 DIAGNOSIS — J449 Chronic obstructive pulmonary disease, unspecified: Secondary | ICD-10-CM | POA: Diagnosis not present

## 2017-11-28 DIAGNOSIS — E669 Obesity, unspecified: Secondary | ICD-10-CM | POA: Diagnosis not present

## 2017-12-29 DIAGNOSIS — E669 Obesity, unspecified: Secondary | ICD-10-CM | POA: Diagnosis not present

## 2017-12-29 DIAGNOSIS — J449 Chronic obstructive pulmonary disease, unspecified: Secondary | ICD-10-CM | POA: Diagnosis not present

## 2017-12-29 DIAGNOSIS — M545 Low back pain: Secondary | ICD-10-CM | POA: Diagnosis not present

## 2017-12-29 DIAGNOSIS — Z720 Tobacco use: Secondary | ICD-10-CM | POA: Diagnosis not present

## 2017-12-29 DIAGNOSIS — Z5181 Encounter for therapeutic drug level monitoring: Secondary | ICD-10-CM | POA: Diagnosis not present

## 2017-12-29 DIAGNOSIS — Z6832 Body mass index (BMI) 32.0-32.9, adult: Secondary | ICD-10-CM | POA: Diagnosis not present

## 2018-01-29 DIAGNOSIS — M545 Low back pain: Secondary | ICD-10-CM | POA: Diagnosis not present

## 2018-01-29 DIAGNOSIS — Z6833 Body mass index (BMI) 33.0-33.9, adult: Secondary | ICD-10-CM | POA: Diagnosis not present

## 2018-01-29 DIAGNOSIS — J449 Chronic obstructive pulmonary disease, unspecified: Secondary | ICD-10-CM | POA: Diagnosis not present

## 2018-03-02 DIAGNOSIS — Z5181 Encounter for therapeutic drug level monitoring: Secondary | ICD-10-CM | POA: Diagnosis not present

## 2018-03-02 DIAGNOSIS — E785 Hyperlipidemia, unspecified: Secondary | ICD-10-CM | POA: Diagnosis not present

## 2018-03-02 DIAGNOSIS — Z6833 Body mass index (BMI) 33.0-33.9, adult: Secondary | ICD-10-CM | POA: Diagnosis not present

## 2018-03-02 DIAGNOSIS — J449 Chronic obstructive pulmonary disease, unspecified: Secondary | ICD-10-CM | POA: Diagnosis not present

## 2018-03-02 DIAGNOSIS — M545 Low back pain: Secondary | ICD-10-CM | POA: Diagnosis not present

## 2018-03-02 DIAGNOSIS — E119 Type 2 diabetes mellitus without complications: Secondary | ICD-10-CM | POA: Diagnosis not present

## 2018-03-02 DIAGNOSIS — I10 Essential (primary) hypertension: Secondary | ICD-10-CM | POA: Diagnosis not present

## 2018-03-02 DIAGNOSIS — Z2821 Immunization not carried out because of patient refusal: Secondary | ICD-10-CM | POA: Diagnosis not present

## 2018-03-30 DIAGNOSIS — Z136 Encounter for screening for cardiovascular disorders: Secondary | ICD-10-CM | POA: Diagnosis not present

## 2018-03-30 DIAGNOSIS — Z0000 Encounter for general adult medical examination without abnormal findings: Secondary | ICD-10-CM | POA: Diagnosis not present

## 2018-03-30 DIAGNOSIS — E785 Hyperlipidemia, unspecified: Secondary | ICD-10-CM | POA: Diagnosis not present

## 2018-03-30 DIAGNOSIS — Z1211 Encounter for screening for malignant neoplasm of colon: Secondary | ICD-10-CM | POA: Diagnosis not present

## 2018-03-30 DIAGNOSIS — Z125 Encounter for screening for malignant neoplasm of prostate: Secondary | ICD-10-CM | POA: Diagnosis not present

## 2018-03-30 DIAGNOSIS — Z6833 Body mass index (BMI) 33.0-33.9, adult: Secondary | ICD-10-CM | POA: Diagnosis not present

## 2018-03-30 DIAGNOSIS — E669 Obesity, unspecified: Secondary | ICD-10-CM | POA: Diagnosis not present

## 2018-04-01 DIAGNOSIS — M545 Low back pain: Secondary | ICD-10-CM | POA: Diagnosis not present

## 2018-04-01 DIAGNOSIS — Z6833 Body mass index (BMI) 33.0-33.9, adult: Secondary | ICD-10-CM | POA: Diagnosis not present

## 2018-04-01 DIAGNOSIS — Z23 Encounter for immunization: Secondary | ICD-10-CM | POA: Diagnosis not present

## 2018-04-01 DIAGNOSIS — Z5181 Encounter for therapeutic drug level monitoring: Secondary | ICD-10-CM | POA: Diagnosis not present

## 2018-05-01 DIAGNOSIS — Z6833 Body mass index (BMI) 33.0-33.9, adult: Secondary | ICD-10-CM | POA: Diagnosis not present

## 2018-05-01 DIAGNOSIS — M545 Low back pain: Secondary | ICD-10-CM | POA: Diagnosis not present

## 2018-05-01 DIAGNOSIS — Z5181 Encounter for therapeutic drug level monitoring: Secondary | ICD-10-CM | POA: Diagnosis not present

## 2018-05-24 ENCOUNTER — Emergency Department
Admission: EM | Admit: 2018-05-24 | Discharge: 2018-05-24 | Disposition: A | Discharge status: Home / Self Care | Payer: Self-pay | Attending: General Practice | Admitting: General Practice

## 2018-05-24 DIAGNOSIS — M545 Low back pain, unspecified: Secondary | ICD-10-CM

## 2018-05-24 DIAGNOSIS — Z87891 Personal history of nicotine dependence: Secondary | ICD-10-CM | POA: Insufficient documentation

## 2018-05-24 MED ORDER — CYCLOBENZAPRINE HCL 5 MG PO TABS
5.0000 mg | ORAL_TABLET | Freq: Two times a day (BID) | ORAL | 0 refills | Status: DC | PRN
Start: 2018-05-24 — End: 2019-10-04

## 2018-05-24 MED ORDER — CYCLOBENZAPRINE HCL 10 MG PO TABS
5.0000 mg | ORAL_TABLET | Freq: Once | ORAL | Status: AC
Start: 2018-05-24 — End: 2018-05-24
  Administered 2018-05-24: 5 mg via ORAL
  Filled 2018-05-24: qty 1

## 2018-05-24 MED ORDER — ACETAMINOPHEN 500 MG PO TABS
1000.0000 mg | ORAL_TABLET | Freq: Once | ORAL | Status: AC
Start: 2018-05-24 — End: 2018-05-24
  Administered 2018-05-24: 1000 mg via ORAL
  Filled 2018-05-24: qty 2

## 2018-05-24 MED ORDER — ACETAMINOPHEN 325 MG PO TABS
650.0000 mg | ORAL_TABLET | ORAL | 0 refills | Status: DC | PRN
Start: 2018-05-24 — End: 2019-10-04
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
## 2018-06-01 DIAGNOSIS — C44320 Squamous cell carcinoma of skin of unspecified parts of face: Secondary | ICD-10-CM | POA: Diagnosis not present

## 2018-06-01 DIAGNOSIS — M545 Low back pain: Secondary | ICD-10-CM | POA: Diagnosis not present

## 2018-06-01 DIAGNOSIS — Z87891 Personal history of nicotine dependence: Secondary | ICD-10-CM | POA: Diagnosis not present

## 2018-06-01 DIAGNOSIS — Z6833 Body mass index (BMI) 33.0-33.9, adult: Secondary | ICD-10-CM | POA: Diagnosis not present

## 2018-06-01 DIAGNOSIS — Z5181 Encounter for therapeutic drug level monitoring: Secondary | ICD-10-CM | POA: Diagnosis not present

## 2018-06-18 DIAGNOSIS — L905 Scar conditions and fibrosis of skin: Secondary | ICD-10-CM | POA: Diagnosis not present

## 2018-06-18 DIAGNOSIS — Z85828 Personal history of other malignant neoplasm of skin: Secondary | ICD-10-CM | POA: Diagnosis not present

## 2018-06-18 DIAGNOSIS — C4442 Squamous cell carcinoma of skin of scalp and neck: Secondary | ICD-10-CM | POA: Diagnosis not present

## 2018-06-18 DIAGNOSIS — D485 Neoplasm of uncertain behavior of skin: Secondary | ICD-10-CM | POA: Diagnosis not present

## 2018-07-02 DIAGNOSIS — Z1331 Encounter for screening for depression: Secondary | ICD-10-CM | POA: Diagnosis not present

## 2018-07-02 DIAGNOSIS — Z87891 Personal history of nicotine dependence: Secondary | ICD-10-CM | POA: Diagnosis not present

## 2018-07-02 DIAGNOSIS — M545 Low back pain: Secondary | ICD-10-CM | POA: Diagnosis not present

## 2018-07-02 DIAGNOSIS — Z6834 Body mass index (BMI) 34.0-34.9, adult: Secondary | ICD-10-CM | POA: Diagnosis not present

## 2018-07-02 DIAGNOSIS — J449 Chronic obstructive pulmonary disease, unspecified: Secondary | ICD-10-CM | POA: Diagnosis not present

## 2018-07-02 DIAGNOSIS — Z5181 Encounter for therapeutic drug level monitoring: Secondary | ICD-10-CM | POA: Diagnosis not present

## 2018-07-31 ENCOUNTER — Encounter (HOSPITAL_COMMUNITY): Payer: Self-pay

## 2018-07-31 ENCOUNTER — Emergency Department (HOSPITAL_COMMUNITY): Payer: Medicare Other

## 2018-07-31 ENCOUNTER — Emergency Department (HOSPITAL_COMMUNITY)
Admission: EM | Admit: 2018-07-31 | Discharge: 2018-07-31 | Disposition: A | Discharge status: Home / Self Care | Payer: Medicare Other | Attending: Emergency Medicine | Admitting: Emergency Medicine

## 2018-07-31 ENCOUNTER — Other Ambulatory Visit: Payer: Self-pay

## 2018-07-31 DIAGNOSIS — R109 Unspecified abdominal pain: Secondary | ICD-10-CM | POA: Diagnosis present

## 2018-07-31 DIAGNOSIS — Z79899 Other long term (current) drug therapy: Secondary | ICD-10-CM | POA: Insufficient documentation

## 2018-07-31 DIAGNOSIS — K5790 Diverticulosis of intestine, part unspecified, without perforation or abscess without bleeding: Secondary | ICD-10-CM

## 2018-07-31 DIAGNOSIS — I10 Essential (primary) hypertension: Secondary | ICD-10-CM | POA: Diagnosis not present

## 2018-07-31 DIAGNOSIS — F17210 Nicotine dependence, cigarettes, uncomplicated: Secondary | ICD-10-CM | POA: Diagnosis not present

## 2018-07-31 DIAGNOSIS — K5732 Diverticulitis of large intestine without perforation or abscess without bleeding: Secondary | ICD-10-CM | POA: Insufficient documentation

## 2018-07-31 LAB — LIPASE, BLOOD: Lipase: 23 U/L (ref 11–51)

## 2018-07-31 LAB — CBC
HCT: 50.9 % (ref 39.0–52.0)
Hemoglobin: 17 g/dL (ref 13.0–17.0)
MCH: 31.7 pg (ref 26.0–34.0)
MCHC: 33.4 g/dL (ref 30.0–36.0)
MCV: 94.8 fL (ref 80.0–100.0)
Platelets: 210 10*3/uL (ref 150–400)
RBC: 5.37 MIL/uL (ref 4.22–5.81)
RDW: 12.8 % (ref 11.5–15.5)
WBC: 13.3 10*3/uL — ABNORMAL HIGH (ref 4.0–10.5)
nRBC: 0 % (ref 0.0–0.2)

## 2018-07-31 LAB — COMPREHENSIVE METABOLIC PANEL
ALT: 51 U/L — ABNORMAL HIGH (ref 0–44)
AST: 40 U/L (ref 15–41)
Albumin: 4 g/dL (ref 3.5–5.0)
Alkaline Phosphatase: 69 U/L (ref 38–126)
Anion gap: 11 (ref 5–15)
BILIRUBIN TOTAL: 0.9 mg/dL (ref 0.3–1.2)
BUN: 16 mg/dL (ref 8–23)
CO2: 23 mmol/L (ref 22–32)
Calcium: 9.5 mg/dL (ref 8.9–10.3)
Chloride: 103 mmol/L (ref 98–111)
Creatinine, Ser: 0.93 mg/dL (ref 0.61–1.24)
GFR calc Af Amer: >60 mL/min (ref >60)
GFR calc non Af Amer: >60 mL/min (ref >60)
Glucose, Bld: 164 mg/dL — ABNORMAL HIGH (ref 70–99)
Potassium: 3.7 mmol/L (ref 3.5–5.1)
Sodium: 137 mmol/L (ref 135–145)
TOTAL PROTEIN: 7.1 g/dL (ref 6.5–8.1)

## 2018-07-31 LAB — I-STAT CREATININE, ED: CREATININE: 0.9 mg/dL (ref 0.61–1.24)

## 2018-07-31 MED ORDER — LACTATED RINGERS IV BOLUS
1000.0000 mL | Freq: Once | INTRAVENOUS | Status: AC
  Administered 2018-07-31: 1000 mL via INTRAVENOUS

## 2018-07-31 MED ORDER — MORPHINE SULFATE (PF) 4 MG/ML IV SOLN
4.0000 mg | Freq: Once | INTRAVENOUS | Status: AC
  Administered 2018-07-31: 4 mg via INTRAVENOUS
  Filled 2018-07-31: qty 1

## 2018-07-31 MED ORDER — OXYCODONE-ACETAMINOPHEN 5-325 MG PO TABS: 1.0000 | ORAL_TABLET | Freq: Once | ORAL | Status: DC

## 2018-07-31 MED ORDER — IOHEXOL 300 MG/ML  SOLN
100.0000 mL | Freq: Once | INTRAMUSCULAR | Status: AC | PRN
  Administered 2018-07-31: 100 mL via INTRAVENOUS

## 2018-07-31 MED ORDER — AMOXICILLIN-POT CLAVULANATE 875-125 MG PO TABS: 1.0000 | ORAL_TABLET | Freq: Two times a day (BID) | ORAL | 0 refills | Status: AC

## 2018-07-31 MED ORDER — OXYCODONE-ACETAMINOPHEN 5-325 MG PO TABS: 2.0000 | ORAL_TABLET | Freq: Three times a day (TID) | ORAL | 0 refills | Status: AC | PRN

## 2018-07-31 NOTE — ED Notes (Signed)
Pt given discharge instructions, medications reviewed and given the opportunity to ask questions. Pt verbalized understanding. Pt discharged without incident.

## 2018-07-31 NOTE — ED Provider Notes (Signed)
[facility] Provider Note   CSN: [id number] Arrival date & time: 07/31/18  1409    History   Chief Complaint Chief Complaint  Patient presents with  . Abdominal Pain    HPI Henry JESUS Sr. is a 63 y.o. male.      Abdominal Pain  Pain location:  Epigastric Pain quality: gnawing and pressure   Pain radiates to:  Does not radiate Pain severity:  Moderate Onset quality:  Gradual Duration:  2 days Timing:  Constant Progression:  Waxing and waning Chronicity:  New Context: not retching, not sick contacts, not suspicious food intake and not trauma   Relieved by:  None tried Worsened by:  Nothing Ineffective treatments:  None tried Associated symptoms: no chest pain, no chills, no cough, no dysuria, no fever, no hematuria, no shortness of breath, no sore throat and no vomiting   Risk factors: no recent hospitalization     Past Medical History:  Diagnosis Date  . Asthma   . H/O hiatal hernia   . Hypertension   . Restless leg syndrome     There are no active problems to display for this patient.   Past Surgical History:  Procedure Laterality Date  . BACK SURGERY    . TONSILLECTOMY          Home Medications    Prior to Admission medications   Medication Sig Start Date End Date Taking? Authorizing Provider  albuterol (PROVENTIL HFA;VENTOLIN HFA) 108 (90 BASE) MCG/ACT inhaler Inhale 2 puffs into the lungs every 6 (six) hours as needed for wheezing or shortness of breath.    Yes [provider]  albuterol (PROVENTIL) (2.5 MG/3ML) 0.083% nebulizer solution Take 2.5 mg by nebulization every 6 (six) hours as needed for wheezing or shortness of breath.   Yes [provider]  HYDROmorphone (DILAUDID) 4 MG tablet Take 4 mg by mouth every 6 (six) hours as needed for severe pain.  06/16/18  Yes [provider]  ibuprofen (ADVIL,MOTRIN) 200 MG tablet Take 400 mg by mouth every 6 (six) hours as needed  (pain).   Yes [provider]  lisinopril-hydrochlorothiazide (PRINZIDE,ZESTORETIC) 20-12.5 MG tablet Take 1 tablet by mouth daily. for high blood pressure 06/08/18  Yes [provider]  omeprazole (PRILOSEC) 40 MG capsule Take 40 mg by mouth daily. For acid reflux 06/11/18  Yes [provider]  amoxicillin-clavulanate (AUGMENTIN) 875-125 MG tablet Take 1 tablet by mouth every 12 (twelve) hours for 7 days. 07/31/18 08/07/18  Orson Aloe, MD  oxyCODONE-acetaminophen (PERCOCET/ROXICET) 5-325 MG tablet Take 2 tablets by mouth every 8 (eight) hours as needed for up to 15 doses for severe pain. 07/31/18   Orson Aloe, MD    Family History History reviewed. No pertinent family history.  Social History Social History   Tobacco Use  . Smoking status: Current Every Day Smoker    Packs/day: 2.00    Years: 38.00    Pack years: 76.00    Types: Cigarettes  . Smokeless tobacco: Never Used  Substance Use Topics  . Alcohol use: Yes    Comment: does not drink every day  . Drug use: Not on file     Allergies   Penicillins and Other   Review of Systems Review of Systems  Constitutional: Negative for chills and fever.  HENT: Negative for ear pain and sore throat.   Eyes: Negative for pain and visual disturbance.  Respiratory: Negative for cough and shortness of breath.  Cardiovascular: Negative for chest pain and palpitations.  Gastrointestinal: Positive for abdominal pain. Negative for vomiting.  Genitourinary: Negative for dysuria and hematuria.  Musculoskeletal: Negative for arthralgias and back pain.  Skin: Negative for color change and rash.  Neurological: Negative for seizures and syncope.  All other systems reviewed and are negative.    Physical Exam Updated Vital Signs BP (!) 141/95 (BP Location: Right Arm)   Pulse 73   Temp 98.7 F (37.1 C) (Oral)   Resp 16   Ht 6' (1.829 m)   Wt 108.9 kg   SpO2 96%   BMI 32.55 kg/m   Physical  Exam Vitals signs and nursing note reviewed.  Constitutional:      Appearance: He is well-developed.     Comments: Patient was resting comfortably, no acute distress.  HENT:     Head: Normocephalic and atraumatic.  Eyes:     Conjunctiva/sclera: Conjunctivae normal.  Neck:     Musculoskeletal: Neck supple.  Cardiovascular:     Rate and Rhythm: Normal rate and regular rhythm.     Heart sounds: No murmur.  Pulmonary:     Effort: Pulmonary effort is normal. No respiratory distress.     Breath sounds: Normal breath sounds.  Abdominal:     Palpations: Abdomen is soft.     Tenderness: There is abdominal tenderness in the epigastric area. There is no right CVA tenderness or left CVA tenderness.     Hernia: No hernia is present.  Skin:    General: Skin is warm and dry.     Capillary Refill: Capillary refill takes less than 2 seconds.  Neurological:     General: No focal deficit present.     Mental Status: He is alert.  Psychiatric:        Mood and Affect: Mood normal.      ED Treatments / Results  Labs (all labs ordered are listed, but only abnormal results are displayed) Labs Reviewed  COMPREHENSIVE METABOLIC PANEL - Abnormal; Notable for the following components:      Result Value   Glucose, Bld 164 (*)    ALT 51 (*)    All other components within normal limits  CBC - Abnormal; Notable for the following components:   WBC 13.3 (*)    All other components within normal limits  LIPASE, BLOOD  I-STAT CREATININE, ED    EKG EKG Interpretation  Date/Time:  Friday July 31 2018 14:55:23 EST Ventricular Rate:  82 PR Interval:  182 QRS Duration: 104 QT Interval:  406 QTC Calculation: 474 R Axis:   59 Text Interpretation:  Normal sinus rhythm Normal ECG No significant change since last tracing Confirmed by Merrily Pew [phone]) on 07/31/2018 4:24:26 PM   Radiology Ct Abdomen Pelvis W Contrast  Result Date: 07/31/2018 CLINICAL DATA:  63 y/o M; severe lower abdominal  pain with nausea. Prior liver biopsy with steatosis. EXAM: CT ABDOMEN AND PELVIS WITH CONTRAST TECHNIQUE: Multidetector CT imaging of the abdomen and pelvis was performed using the standard protocol following bolus administration of intravenous contrast. CONTRAST:  173mL OMNIPAQUE IOHEXOL 300 MG/ML  SOLN COMPARISON:  07/18/2011 CT abdomen and pelvis. FINDINGS: Lower chest: No acute abnormality. Hepatobiliary: Heterogeneous foci of hypoattenuation predominantly in the right lobe of liver mildly increased in comparison with prior CT of abdomen and pelvis, probably worsening steatosis. Trace perihepatic ascites. Mild hepatomegaly. No focal liver abnormality is seen. No gallstones, gallbladder wall thickening, or biliary dilatation. Pancreas: Unremarkable. No pancreatic ductal dilatation or surrounding inflammatory  changes. Spleen: Normal in size without focal abnormality. Adrenals/Urinary Tract: Adrenal glands are unremarkable. Kidneys are normal, without renal calculi, focal lesion, or hydronephrosis. Bladder is unremarkable. Stomach/Bowel: Mildly increased in size duodenal diverticulum arising from the third segment of duodenum measuring 4 cm without inflammatory change. Stomach is within normal limits. Appendix appears normal, no the appendix extends into a small right inguinal hernia. No evidence of bowel wall thickening, distention, or inflammatory changes. Sigmoid diverticulosis without findings of acute diverticulitis. Vascular/Lymphatic: Aortic atherosclerosis. No enlarged abdominal or pelvic lymph nodes. Reproductive: Prostate is unremarkable. Other: No abdominopelvic ascites. Musculoskeletal: No fracture is seen. Multilevel degenerative changes of the spine with endplate Schmorl's nodes, disc desiccation, and facet arthropathy. Congenital lumbar spinal canal stenosis. No high-grade spinal canal stenosis. Bilateral foraminal stenosis at L4-5 and L5-S1. Osteoarthrosis of the hip joints with loss of the joint  space and acetabular osteophytosis. IMPRESSION: 1. Mild hepatomegaly and trace perihepatic ascites. Increased hepatic steatosis from prior CT. 2. Sigmoid diverticulosis without findings of acute diverticulitis. 3. Small right inguinal hernia containing a normal appearing appendix. 4. Aortic Atherosclerosis (ICD10-I70.0). Electronically Signed   By: Kristine Garbe M.D.   On: 07/31/2018 17:53    Procedures Procedures (including critical care time)  Medications Ordered in ED Medications  lactated ringers bolus 1,000 mL (0 mLs Intravenous Stopped 07/31/18 1839)  morphine 4 MG/ML injection 4 mg (4 mg Intravenous Given 07/31/18 1602)  iohexol (OMNIPAQUE) 300 MG/ML solution 100 mL (100 mLs Intravenous Contrast Given 07/31/18 1722)  morphine 4 MG/ML injection 4 mg (4 mg Intravenous Given 07/31/18 1841)     Initial Impression / Assessment and Plan / ED Course  I have reviewed the triage vital signs and the nursing notes.  Pertinent labs & imaging results that were available during my care of the patient were reviewed by me and considered in my medical decision making (see chart for details).        63 year old male significant past medical history as above including diverticulitis who presents with worsening abdominal pain over the last 2 days and epigastric region.  Patient denies any recent travel, trauma, suspicious food intakes, fever or infectious symptoms at this time.  Physical exam consistent with epigastric tenderness concerning for ulcer disease, pancreatitis or other acute etiologies and abdomen.  Will obtain baseline laboratory studies as well as CT imaging here in the emergency department doubt.  No pulsatile mass or bruit heard on auscultation the abdomen.  Doubt AAA at this time.  Patient denies any chest pain, shortness of breath.  Laboratory studies indicate leukocytosis, other laboratory studies reviewed using decision-making regarding management.  CT scanning indicates  diverticulosis, based on patient's presentation and symptoms we will treat for diverticulitis at this time.  Will give pain management for home as well as antibiotic therapy.  Patient is in follow-up with primary care. patient instructed to return the emergency department if symptoms worsen or change in character.  Patient agreement with this plan.  Electronic pain management system not working, paper prescription provided. patient discharged in stable additional stable vital signs.  The above stable care was discussed and agreed upon by my attending physician.  Final Clinical Impressions(s) / ED Diagnoses   Final diagnoses:  Diverticulosis    ED Discharge Orders         Ordered    amoxicillin-clavulanate (AUGMENTIN) 875-125 MG tablet  Every 12 hours     07/31/18 1829    oxyCODONE-acetaminophen (PERCOCET/ROXICET) 5-325 MG tablet  Every 8 hours PRN  07/31/18 1829           Orson Aloe, MD 08/01/18 [id number]    Merrily Pew, MD 08/10/18 2340

## 2018-07-31 NOTE — ED Triage Notes (Signed)
Pt arrives POV for eval of epigastric/abd pain onset this AM. Reports hx of diverticulitis, last flare 6-8 yrs ago, states this feels similar. Endorses nausea, no vomiting or diarrhea. Denies CP/SBOB

## 2019-08-05 DIAGNOSIS — E119 Type 2 diabetes mellitus without complications: Secondary | ICD-10-CM | POA: Diagnosis not present

## 2019-08-05 DIAGNOSIS — G8929 Other chronic pain: Secondary | ICD-10-CM | POA: Diagnosis not present

## 2019-08-05 DIAGNOSIS — Z9181 History of falling: Secondary | ICD-10-CM | POA: Diagnosis not present

## 2019-08-05 DIAGNOSIS — M545 Low back pain: Secondary | ICD-10-CM | POA: Diagnosis not present

## 2019-08-05 DIAGNOSIS — I10 Essential (primary) hypertension: Secondary | ICD-10-CM | POA: Diagnosis not present

## 2019-09-06 DIAGNOSIS — M545 Low back pain: Secondary | ICD-10-CM | POA: Diagnosis not present

## 2019-09-06 DIAGNOSIS — Z79891 Long term (current) use of opiate analgesic: Secondary | ICD-10-CM | POA: Diagnosis not present

## 2019-09-06 DIAGNOSIS — Z79899 Other long term (current) drug therapy: Secondary | ICD-10-CM | POA: Diagnosis not present

## 2019-09-06 DIAGNOSIS — Z5181 Encounter for therapeutic drug level monitoring: Secondary | ICD-10-CM | POA: Diagnosis not present

## 2019-09-06 DIAGNOSIS — G8929 Other chronic pain: Secondary | ICD-10-CM | POA: Diagnosis not present

## 2019-10-04 ENCOUNTER — Emergency Department: Payer: Medicare Other

## 2019-10-04 ENCOUNTER — Inpatient Hospital Stay
Admission: EM | Admit: 2019-10-04 | Discharge: 2019-10-08 | DRG: 064 | Disposition: A | Discharge status: Rehabilitation Facility | Payer: Medicare Other | Attending: Internal Medicine | Admitting: Internal Medicine

## 2019-10-04 ENCOUNTER — Inpatient Hospital Stay: Payer: Medicare Other

## 2019-10-04 DIAGNOSIS — I10 Essential (primary) hypertension: Secondary | ICD-10-CM | POA: Diagnosis present

## 2019-10-04 DIAGNOSIS — I615 Nontraumatic intracerebral hemorrhage, intraventricular: Principal | ICD-10-CM | POA: Diagnosis present

## 2019-10-04 DIAGNOSIS — I619 Nontraumatic intracerebral hemorrhage, unspecified: Secondary | ICD-10-CM

## 2019-10-04 DIAGNOSIS — G9341 Metabolic encephalopathy: Secondary | ICD-10-CM | POA: Diagnosis present

## 2019-10-04 DIAGNOSIS — G9389 Other specified disorders of brain: Secondary | ICD-10-CM | POA: Diagnosis present

## 2019-10-04 DIAGNOSIS — R29810 Facial weakness: Secondary | ICD-10-CM | POA: Diagnosis present

## 2019-10-04 DIAGNOSIS — R29701 NIHSS score 1: Secondary | ICD-10-CM | POA: Diagnosis present

## 2019-10-04 DIAGNOSIS — I639 Cerebral infarction, unspecified: Secondary | ICD-10-CM | POA: Diagnosis present

## 2019-10-04 DIAGNOSIS — I161 Hypertensive emergency: Secondary | ICD-10-CM | POA: Diagnosis present

## 2019-10-04 DIAGNOSIS — Z20822 Contact with and (suspected) exposure to covid-19: Secondary | ICD-10-CM | POA: Diagnosis present

## 2019-10-04 DIAGNOSIS — G8190 Hemiplegia, unspecified affecting unspecified side: Secondary | ICD-10-CM | POA: Diagnosis present

## 2019-10-04 DIAGNOSIS — I160 Hypertensive urgency: Secondary | ICD-10-CM | POA: Diagnosis present

## 2019-10-04 LAB — COMPREHENSIVE METABOLIC PANEL
ALT: 20 U/L (ref 0–55)
AST (SGOT): 21 U/L (ref 5–34)
Albumin/Globulin Ratio: 1.2 (ref 0.9–2.2)
Albumin: 4.2 g/dL (ref 3.5–5.0)
Alkaline Phosphatase: 100 U/L (ref 38–106)
Anion Gap: 10 (ref 5.0–15.0)
BUN: 11 mg/dL (ref 9.0–28.0)
Bilirubin, Total: 1.2 mg/dL (ref 0.2–1.2)
CO2: 23 mEq/L (ref 22–29)
Calcium: 9.4 mg/dL (ref 8.5–10.5)
Chloride: 104 mEq/L (ref 100–111)
Creatinine: 1 mg/dL (ref 0.7–1.3)
Globulin: 3.4 g/dL (ref 2.0–3.6)
Glucose: 92 mg/dL (ref 70–100)
Potassium: 4 mEq/L (ref 3.5–5.1)
Protein, Total: 7.6 g/dL (ref 6.0–8.3)
Sodium: 137 mEq/L (ref 136–145)

## 2019-10-04 LAB — RAPID DRUG SCREEN, URINE
Barbiturate Screen, UR: NEGATIVE
Benzodiazepine Screen, UR: NEGATIVE
Cannabinoid Screen, UR: NEGATIVE
Cocaine, UR: NEGATIVE
Opiate Screen, UR: NEGATIVE
PCP Screen, UR: NEGATIVE
Urine Amphetamine Screen: NEGATIVE

## 2019-10-04 LAB — CBC AND DIFFERENTIAL
Absolute NRBC: 0 10*3/uL (ref 0.00–0.00)
Basophils Absolute Automated: 0.03 10*3/uL (ref 0.00–0.08)
Basophils Automated: 0.4 %
Eosinophils Absolute Automated: 0.1 10*3/uL (ref 0.00–0.44)
Eosinophils Automated: 1.3 %
Hematocrit: 42.3 % (ref 37.6–49.6)
Hgb: 12.7 g/dL (ref 12.5–17.1)
Immature Granulocytes Absolute: 0.02 10*3/uL (ref 0.00–0.07)
Immature Granulocytes: 0.3 %
Lymphocytes Absolute Automated: 2.08 10*3/uL (ref 0.42–3.22)
Lymphocytes Automated: 26.3 %
MCH: 20.7 pg — ABNORMAL LOW (ref 25.1–33.5)
MCHC: 30 g/dL — ABNORMAL LOW (ref 31.5–35.8)
MCV: 68.9 fL — ABNORMAL LOW (ref 78.0–96.0)
MPV: 9.2 fL (ref 8.9–12.5)
Monocytes Absolute Automated: 0.78 10*3/uL (ref 0.21–0.85)
Monocytes: 9.9 %
Neutrophils Absolute: 4.9 10*3/uL (ref 1.10–6.33)
Neutrophils: 61.8 %
Nucleated RBC: 0 /100 WBC (ref 0.0–0.0)
Platelets: 329 10*3/uL (ref 142–346)
RBC: 6.14 10*6/uL — ABNORMAL HIGH (ref 4.20–5.90)
RDW: 21 % — ABNORMAL HIGH (ref 11–15)
WBC: 7.91 10*3/uL (ref 3.10–9.50)

## 2019-10-04 LAB — CELL MORPHOLOGY
Cell Morphology: ABNORMAL — AB
Platelet Estimate: NORMAL

## 2019-10-04 LAB — PT AND APTT
PT INR: 1.2 — ABNORMAL HIGH (ref 0.9–1.1)
PT: 13.8 s — ABNORMAL HIGH (ref 10.1–12.9)
PTT: 39 s (ref 27–39)

## 2019-10-04 LAB — GFR: EGFR: >60

## 2019-10-04 LAB — COVID-19 (SARS-COV-2): SARS CoV 2 Overall Result: NEGATIVE

## 2019-10-04 LAB — TROPONIN I: Troponin I: 0.01 ng/mL (ref 0.00–0.05)

## 2019-10-04 LAB — HEMOLYSIS INDEX: Hemolysis Index: 15 (ref 0–18)

## 2019-10-04 LAB — GLUCOSE WHOLE BLOOD - POCT: Whole Blood Glucose POCT: 84 mg/dL (ref 70–100)

## 2019-10-04 MED ORDER — SENNOSIDES-DOCUSATE SODIUM 8.6-50 MG PO TABS
1.0000 | ORAL_TABLET | Freq: Two times a day (BID) | ORAL | Status: DC
Start: 2019-10-04 — End: 2019-10-08
  Administered 2019-10-06 – 2019-10-08 (×5): 1 via ORAL
  Filled 2019-10-04 (×6): qty 1

## 2019-10-04 MED ORDER — LEVETIRACETAM 500 MG/5ML IV SOLN
750.00 mg | Freq: Two times a day (BID) | INTRAVENOUS | Status: DC
Start: 2019-10-04 — End: 2019-10-06
  Administered 2019-10-04 – 2019-10-06 (×5): 750 mg via INTRAVENOUS
  Filled 2019-10-04 (×6): qty 7.5

## 2019-10-04 MED ORDER — NICARDIPINE HCL 2.5 MG/ML IV SOLN
5.00 mg/h | INTRAVENOUS | Status: DC
Start: 2019-10-04 — End: 2019-10-06
  Administered 2019-10-04: 12:00:00 15 mg/h via INTRAVENOUS
  Administered 2019-10-04: 18:00:00 7.5 mg/h via INTRAVENOUS
  Administered 2019-10-04: 14:00:00 12.5 mg/h via INTRAVENOUS
  Administered 2019-10-04 (×2): 5 mg/h via INTRAVENOUS
  Administered 2019-10-05: 15:00:00 2.5 mg/h via INTRAVENOUS
  Filled 2019-10-04 (×6): qty 10

## 2019-10-04 NOTE — Progress Notes (Signed)
Called to patient's bedside due to acute neurological change.  Patient assessed, right pupil 2mm brisk react left pupil 4mm sluggish reaction, patient moves all 4 extremities.  He continues to follow commands, endorses headache.  Stat head CT pending. Neurosurgery paged at 19:15, call returned by Vanessa Kick, neurosurgery PA.  Will follow up following head CT.

## 2019-10-04 NOTE — ED Notes (Signed)
Bed: BL16  Expected date:   Expected time:   Means of arrival:   Comments:  Medic 405

## 2019-10-04 NOTE — H&P (Signed)
CRITICAL CARE ADMISSION    Date Time: 10/04/19 1:40 PM  Patient Name: Michael White, Michael SR.  Attending Physician: Lattie Haw, MD    ICU Problem List: Right hemorrhagic thalamic infarction, encephalopathy, hypertensive urgency    Assessment:     .  Acute hemorrhagic infarction of right thalamus  .  Encephalopathy  .  Hypertensive urgency    Plan:     .  Seen by neurosurgery, no intervention needed at this point.  Start Keppra for seizure prophylaxis  .  Encephalopathy, secondary to above, continue to monitor neurological status  .  Starting Cardene drip, target systolic blood pressures less than 120    Discussed with emergency room physician  Discussed with patient  Discussed with neurosurgery physician assistant    History of Present Illness:   Michael White. is a 64 y.o. male with past medical history significant of hypertension who has not seen any physician for many years what brought into emergency room by paramedics when he was found confused by his coworker.  CT of the head done in emergency room showed acute hemorrhagic infarction in the right thalamus.  At present patient is awake alert oriented to self however has been intermittently confused.    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    Borderline hypertension     on meds in past; BP normal without meds recently       Past Surgical History:     Past Surgical History:   Procedure Laterality Date    CARDIAC ABLATION  04/15/2013    ablation of slow pathway for AVNRT       Family History:     Family History   Problem Relation Age of Onset    Heart disease Father         unknown what kind but not MI       Social History:     Social History     Socioeconomic History    Marital status: Legally Separated     Spouse name: Not on file    Number of children: Not on file    Years of education: Not  on file    Highest education level: Not on file   Occupational History    Not on file   Tobacco Use    Smoking status: Former Smoker     Packs/day: 0.50     Years: 14.00     Pack years: 7.00     Types: Cigarettes     Quit date: 08/28/2012     Years since quitting: 7.1    Smokeless tobacco: Never Used   Substance and Sexual Activity    Alcohol use: Yes     Comment: occ beer some weekends    Drug use: No    Sexual activity: Not on file   Other Topics Concern    Not on file   Social History Narrative    Not on file     Social Determinants of Health     Financial Resource Strain:     Difficulty of Paying Living Expenses:    Food Insecurity:     Worried About Radiation protection practitioner of Food in the Last Year:     Ran Out of Food in the Last Year:    Transportation Needs:  Lack of Transportation (Medical):     Lack of Transportation (Non-Medical):    Physical Activity:     Days of Exercise per Week:     Minutes of Exercise per Session:    Stress:     Feeling of Stress :    Social Connections:     Frequency of Communication with Friends and Family:     Frequency of Social Gatherings with Friends and Family:     Attends Religious Services:     Active Member of Clubs or Organizations:     Attends Engineer, structural:     Marital Status:    Intimate Partner Violence:     Fear of Current or Ex-Partner:     Emotionally Abused:     Physically Abused:     Sexually Abused:        Home Medications:   (Not in a hospital admission)       Allergies:   No Known Allergies    Inpatient Medications:      Scheduled Meds: PRN Meds:    levETIRAcetam, 750 mg, Intravenous, Q12H SCH        Continuous Infusions:   niCARdipine 12.5 mg/hr (10/04/19 1337)             Review of Systems:     General : negative for - chills, fatigue, fever or night sweats  Ophthalmic : negative for - double vision, excessive tearing, itchy eyes or photophobia  ENT : negative for - headaches, nasal congestion, sore throat or vertigo   Respiratory : negative for - cough, hemoptysis, orthopnea, pleuritic pain, shortness of breath, tachypnea or wheezing  Cardiovascular : negative for - chest pain, edema, orthopnea, rapid heart rate or shortness of breath  Gastrointestinal : negative for - abdominal pain, blood in stools, constipation, diarrhea, heartburn or nausea/vomiting  Musculoskeletal: negative for - muscle pain  Neurological : Positive for left-sided weakness and some slurred speech, confusion, negative for - headaches, visual changes   Dermatological : negative for dry skin, pruritus and rash    Physical Exam:     VITAL SIGNS   Temp:  [99.2 F (37.3 C)] 99.2 F (37.3 C)  Heart Rate:  [59-116] 75  Resp Rate:  [10-22] 14  BP: (114-201)/(59-127) 119/69  Blood Glucose:  Pulse ox:  Telemetry:     No intake or output data in the 24 hours ending 10/04/19 1340     General appearance -awake, intermittently confuseds  Mental status - alert, oriented to self, intermittently confused  Eyes - pupils equal and reactive, extraocular eye movements intact  Ears - no external ear lesions seen  Nose - no nasal discharge   Mouth - clear oral mucosa, moist   Neck - supple, no significant adenopathy  Chest - clear to auscultation, no wheezes, rales or rhonchi, symmetric air entry  Heart - normal rate, regular rhythm, normal S1, S2, no rubs, clicks or gallops  Abdomen - soft, nontender, nondistended, no masses or organomegaly  Neurological - alert, oriented, normal speech, no focal findings or movement disorder noted  Musculoskeletal - no joint swelling or tenderness  Extremities -  no pedal edema, no clubbing or cyanosis  Skin - normal coloration, no rashes    Invasive ICU Hemodynamics:    Lines/Drains/Airways:    Patient Lines/Drains/Airways Status    Active PICC Line / CVC Line / PIV Line / Drain / Airway / Intraosseous Line / Epidural Line / ART Line / Line / Wound / Pressure  Ulcer / NG/OG Tube     Name:   Placement date:   Placement time:   Site:   Days:     Peripheral IV 10/04/19 20 G Right Antecubital   10/04/19    1126    Antecubital   less than 1    Peripheral IV 10/04/19 18 G Left Upper Arm   10/04/19    1207    Upper Arm   less than 1                Labs:     Results     Procedure Component Value Units Date/Time    COVID-19 (SARS-COV-2) ([place] [facility]) [[id number]] Collected: 10/04/19 1202    Specimen: Nasopharyngeal Swab from Nasopharynx Updated: 10/04/19 1245     Purpose of COVID testing Screening     SARS-CoV-2 Specimen Source Nasopharyngeal     SARS CoV 2 Overall Result Negative    Narrative:      o Collect and clearly label specimen type:  o Upper respiratory specimen: One Nasopharyngeal Dry Swab NO  Transport Media.  o Hand deliver to laboratory ASAP  Indication for testing->Extended care facility admission to  semi private room    Urine Tox Screen ([facility] Drug Screen) [[id number]] Collected: 10/04/19 1018    Specimen: Urine Updated: 10/04/19 1043     Urine Amphetamine Screen Negative     Barbiturate Screen, UR Negative     Benzodiazepine Screen, UR Negative     Cannabinoid Screen, UR Negative     Cocaine, UR Negative     Opiate Screen, UR Negative     PCP Screen, UR Negative    PT/APTT [[id number]]  (Abnormal) Collected: 10/04/19 0949     Updated: 10/04/19 1032     PT 13.8 sec      PT INR 1.2     PTT 39 sec     Troponin I [[id number]] Collected: 10/04/19 0949    Specimen: Blood Updated: 10/04/19 1029     Troponin I 0.01 ng/mL     Cell MorpHology [[id number]]  (Abnormal) Collected: 10/04/19 0949     Updated: 10/04/19 1025     Cell Morphology Abnormal     Platelet Estimate Normal     Ovalocytes Present     Spherocytes 1+    CBC and differential [[id number]]  (Abnormal) Collected: 10/04/19 0949    Specimen: Blood Updated: 10/04/19 1025     WBC 7.91 x10 3/uL      Hgb 12.7 g/dL      Hematocrit 53.6 %      Platelets 329 x10 3/uL      RBC 6.14 x10 6/uL      MCV 68.9 fL      MCH 20.7 pg      MCHC 30.0 g/dL      RDW 21 %      MPV 9.2 fL      Neutrophils 61.8 %       Lymphocytes Automated 26.3 %      Monocytes 9.9 %      Eosinophils Automated 1.3 %      Basophils Automated 0.4 %      Immature Granulocytes 0.3 %      Nucleated RBC 0.0 /100 WBC      Neutrophils Absolute 4.90 x10 3/uL      Lymphocytes Absolute Automated 2.08 x10 3/uL      Monocytes Absolute Automated 0.78 x10 3/uL      Eosinophils Absolute Automated 0.10 x10 3/uL  Basophils Absolute Automated 0.03 x10 3/uL      Immature Granulocytes Absolute 0.02 x10 3/uL      Absolute NRBC 0.00 x10 3/uL     Hemolysis index [[id number]] Collected: 10/04/19 0949     Updated: 10/04/19 1010     Hemolysis Index 15    GFR [[id number]] Collected: 10/04/19 0949     Updated: 10/04/19 1010     EGFR >60.0    Comprehensive metabolic panel [[id number]] Collected: 10/04/19 0949    Specimen: Blood Updated: 10/04/19 1010     Glucose 92 mg/dL      BUN 08.6 mg/dL      Creatinine 1.0 mg/dL      Sodium 578 mEq/L      Potassium 4.0 mEq/L      Chloride 104 mEq/L      CO2 23 mEq/L      Calcium 9.4 mg/dL      Protein, Total 7.6 g/dL      Albumin 4.2 g/dL      AST (SGOT) 21 U/L      ALT 20 U/L      Alkaline Phosphatase 100 U/L      Bilirubin, Total 1.2 mg/dL      Globulin 3.4 g/dL      Albumin/Globulin Ratio 1.2     Anion Gap 10.0    Glucose Whole Blood - POCT [[id number]] Collected: 10/04/19 0936     Updated: 10/04/19 0938     Whole Blood Glucose POCT 84 mg/dL             Rads:     Radiology Results (24 Hour)     Procedure Component Value Units Date/Time    XR Chest  AP Portable [[id number]] Collected: 10/04/19 1118    Order Status: Completed Updated: 10/04/19 1120    Narrative:      XR CHEST AP PORTABLE    CLINICAL INDICATION:   stroke    COMPARISON: 04/14/2013    FINDINGS: The cardiomediastinal silhouette appears within normal size  limits for portable technique and patient positioning. There is no  evidence for focal airspace consolidation, pleural effusion, or  pneumothorax. The pulmonary vascularity appears unremarkable.      Impression:       No  acute pulmonary or pleural disease.    Sandie Ano, MD   10/04/2019 11:18 AM    CT Head WO Contrast [[id number]] Collected: 10/04/19 1058    Order Status: Completed Updated: 10/04/19 1115    Narrative:      EXAM: NONCONTRAST HEAD CT    CLINICAL HISTORY: 64 year old man, left-sided weakness, evaluate  right thalamic hemorrhage    TECHNIQUE: Multidetector axial CT images of the brain were  obtained without the use of intravenous contrast. Sagittal and coronal  reconstructions were created. The following dose reduction techniques  were utilized: automated exposure control and/or adjustment of the  mA and/or kV according to patient size, and the use of iterative  reconstruction technique.    COMPARISON: Head CT performed 10/04/2019 at 9:36 AM      FINDINGS:     Brain: Redemonstrated is an acute hemorrhage in the right thalamus  measuring 2.2 x 1.0 cm in greatest axial dimension, similar in  appearance to prior study. The blood extends into the right lateral  ventricle. There is mild surrounding edema. There is no midline shift or  transtentorial herniation. Patchy hypoattenuation in the periventricular  and subcortical white matter is consistent with chronic microvascular  changes. If there is clinical concern  for acute on chronic pathology,  consider MRI for further evaluation secondary to its increased  sensitivity. Calcification is identified within bilateral basal ganglia.    Ventricles: The ventricles and sulci are diffusely enlarged, consistent  with age related volume loss. Intraventricular hemorrhage as above.    Calvarium, Sinuses, Mastoids and Orbits: Unremarkable.    Vasculature: Unremarkable.      Impression:          1.  Acute hemorrhagic infarct in the right thalamus with  intraventricular extension. No herniation or hydrocephalus. Findings  similar to prior study.    2.  Chronic microvascular changes.     Raynelle Dick, MD   10/04/2019 11:13 AM    CT Head without Contrast [[id number]] Collected: 10/04/19 0944     Order Status: Completed Updated: 10/04/19 0952    Narrative:      EXAM: NONCONTRAST HEAD CT    CLINICAL HISTORY: 64 year old man, left-sided weakness    TECHNIQUE: Multidetector axial CT images of the brain were  obtained without the use of intravenous contrast. Sagittal and coronal  reconstructions were created. The following dose reduction techniques  were utilized: automated exposure control and/or adjustment of the  mA and/or kV according to patient size, and the use of iterative  reconstruction technique.    COMPARISON: Head CT performed 09/21/2015      FINDINGS:     Brain: There is an acute hemorrhage in the right thalamus measuring 2.0  x 1.0 cm in greatest axial dimension. The blood extends into the right  lateral ventricle. There is mild surrounding edema. There is no midline  shift or transtentorial herniation. Patchy hypoattenuation in the  periventricular and subcortical white matter is consistent with chronic  microvascular changes. If there is clinical concern for acute on chronic  pathology, consider MRI for further evaluation secondary to its  increased sensitivity. Calcification is identified within bilateral  basal ganglia.    Ventricles: The ventricles and sulci are diffusely enlarged, consistent  with age related volume loss. Intraventricular hemorrhage as above.    Calvarium, Sinuses, Mastoids and Orbits: Unremarkable.    Vasculature: Unremarkable.    Dr. Chrisandra Netters discussed these findings with Dr. Benjaman Kindler on 10/04/2019 9:49 AM.        Impression:          1.  Acute hemorrhagic infarct in the right thalamus with  intraventricular extension. No herniation or hydrocephalus.    2.  Chronic microvascular changes.     Raynelle Dick, MD   10/04/2019 9:49 AM          Signed by: Lattie Haw  Date/Time: 10/04/19 1:40 PM    Critical care time spent 45 minutes

## 2019-10-04 NOTE — Plan of Care (Signed)
FOUR EYES SKIN ASSESSMENT NOTE    RASHEEN BELLS Sr.  07-Jun-1955  [id number]    Braden Scale Score: 17    POC Initiated for Risk for Altered Skin Yes    Patient Assessed for Correct Mattress Surface Yes  *At risk patients with Braden Score less than 12 must be considered for specialty bed    Mepilex or Adhesive Foam Dressing applied to sacrum/heel if any PI risk factors present: Yes    If Wound/Pressure Injury present:    Wound/PI assessment documented in EHR: n/a    Admitting physician notified: n/a    Wound consult ordered: n/a    Ladona Horns, RN  Oct 04, 2019  7:18 PM    Second RN/PCT Name:

## 2019-10-04 NOTE — ED Provider Notes (Signed)
EMERGENCY DEPARTMENT HISTORY AND PHYSICAL EXAM      Date: 10/04/2019  Patient Name: Michael Haggard Sr.    History of Presenting Illness     Chief Complaint   Patient presents with    Altered Mental Status    Cerebrovascular Accident       History Provided By: pt and nurse    Chief Complaint: weakness   Onset: last known well 10 pm last night  Timing: acute  Location: neurological  Quality: weakness, ams,   Severity: moderate-severe  Modifying Factors: none  Associated Symptoms:  Elevated blood pressure    Additional History: Michael Gidney. is a 64 y.o. male with a h/o HTN but not on any medication for years. No current PCP. Pt says that his coworkers found him altered this am. Medics told nurse that pt might have been in his car all night. Pt was incontinent and noted to have left sided weakness. Pt says that he is in the process of quitting smoking and drinking and his last use was middle of April.     PCP: Pcp, None, MD      Current Facility-Administered Medications   Medication Dose Route Frequency Provider Last Rate Last Admin    levETIRAcetam (KEPPRA) 750 mg in sodium chloride 0.9 % 100 mL IVPB  750 mg Intravenous Q12H [facility] Orland Jarred, NP 400 mL/hr at 10/04/19 1246 750 mg at 10/04/19 1246    niCARdipine (CARDENE) 25 mg in sodium chloride 0.9 % 100 mL infusion  5-15 mg/hr Intravenous Continuous Azzie Glatter, MD 20 mL/hr at 10/04/19 1957 5 mg/hr at 10/04/19 1957    senna-docusate (PERICOLACE) 8.6-50 MG per tablet 1 tablet  1 tablet Oral Q12H [facility] Geoffery Lyons, NP           Past History     Past Medical History:  Past Medical History:   Diagnosis Date    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    Borderline hypertension     on meds in past; BP normal without meds recently       Past Surgical History:  Past Surgical History:   Procedure Laterality Date    CARDIAC ABLATION  04/15/2013    ablation of slow  pathway for AVNRT       Family History:  Family History   Problem Relation Age of Onset    Heart disease Father         unknown what kind but not MI       Social History:  Social History     Tobacco Use    Smoking status: Former Smoker     Packs/day: 0.50     Years: 14.00     Pack years: 7.00     Types: Cigarettes     Quit date: 08/28/2012     Years since quitting: 7.1    Smokeless tobacco: Never Used   Substance Use Topics    Alcohol use: Yes     Comment: occ beer some weekends    Drug use: No       Allergies:  No Known Allergies    Review of Systems   Review of Systems   Constitutional: Negative for malaise/fatigue.   Eyes: Negative for discharge and redness.   Respiratory: Negative for cough and shortness of breath.    Cardiovascular: Negative for chest pain.   Gastrointestinal: Negative for abdominal pain  and vomiting.   Musculoskeletal: Negative for back pain.   Skin: Negative for itching.   Neurological: Positive for focal weakness and headaches.   Endo/Heme/Allergies: Does not bruise/bleed easily.   Psychiatric/Behavioral: Negative for hallucinations.       Physical Exam   BP 118/64    Pulse 70    Temp 98.4 F (36.9 C)    Resp 15    Wt 58.6 kg    SpO2 99%    BMI 18.54 kg/m   Physical Exam   Constitutional: He appears well-developed and well-nourished. He appears distressed.   HENT:   Head: Normocephalic and atraumatic.   Eyes: Conjunctivae are normal. Right eye exhibits no discharge. Left eye exhibits no discharge. No scleral icterus.   Cardiovascular: Normal rate, regular rhythm and normal heart sounds.   Pulmonary/Chest: Effort normal and breath sounds normal. No respiratory distress. He has no wheezes. He has no rales.   Abdominal: Soft. Bowel sounds are normal. He exhibits no distension. There is no abdominal tenderness. There is no rebound and no guarding.   Musculoskeletal:         General: No tenderness or edema.      Cervical back: Normal range of motion and neck supple.   Neurological: He is  alert. He displays no seizure activity. GCS eye subscore is 4. GCS verbal subscore is 5. GCS motor subscore is 6.   ? Eyes do not cross to left   Left arm and left leg weaker than right 4/5    Skin: Skin is warm and dry. He is not diaphoretic.   Psychiatric: He has a normal mood and affect. Judgment normal.   Nursing note and vitals reviewed.        Diagnostic Study Results     Labs -     Results     Procedure Component Value Units Date/Time    COVID-19 (SARS-COV-2) [facility] Rapid) [[id number]] Collected: 10/04/19 1202    Specimen: Nasopharyngeal Swab from Nasopharynx Updated: 10/04/19 1245     Purpose of COVID testing Screening     SARS-CoV-2 Specimen Source Nasopharyngeal     SARS CoV 2 Overall Result Negative    Narrative:      o Collect and clearly label specimen type:  o Upper respiratory specimen: One Nasopharyngeal Dry Swab NO  Transport Media.  o Hand deliver to laboratory ASAP  Indication for testing->Extended care facility admission to  semi private room    Urine Tox Screen (Rapid Drug Screen) [[id number]] Collected: 10/04/19 1018    Specimen: Urine Updated: 10/04/19 1043     Urine Amphetamine Screen Negative     Barbiturate Screen, UR Negative     Benzodiazepine Screen, UR Negative     Cannabinoid Screen, UR Negative     Cocaine, UR Negative     Opiate Screen, UR Negative     PCP Screen, UR Negative    PT/APTT [[id number]]  (Abnormal) Collected: 10/04/19 0949     Updated: 10/04/19 1032     PT 13.8 sec      PT INR 1.2     PTT 39 sec     Troponin I [[id number]] Collected: 10/04/19 0949    Specimen: Blood Updated: 10/04/19 1029     Troponin I 0.01 ng/mL     Cell MorpHology [[id number]]  (Abnormal) Collected: 10/04/19 0949     Updated: 10/04/19 1025     Cell Morphology Abnormal     Platelet Estimate Normal     Ovalocytes Present  Spherocytes 1+    CBC and differential [[id number]]  (Abnormal) Collected: 10/04/19 0949    Specimen: Blood Updated: 10/04/19 1025     WBC 7.91 x10 3/uL      Hgb 12.7 g/dL      Hematocrit  40.9 %      Platelets 329 x10 3/uL      RBC 6.14 x10 6/uL      MCV 68.9 fL      MCH 20.7 pg      MCHC 30.0 g/dL      RDW 21 %      MPV 9.2 fL      Neutrophils 61.8 %      Lymphocytes Automated 26.3 %      Monocytes 9.9 %      Eosinophils Automated 1.3 %      Basophils Automated 0.4 %      Immature Granulocytes 0.3 %      Nucleated RBC 0.0 /100 WBC      Neutrophils Absolute 4.90 x10 3/uL      Lymphocytes Absolute Automated 2.08 x10 3/uL      Monocytes Absolute Automated 0.78 x10 3/uL      Eosinophils Absolute Automated 0.10 x10 3/uL      Basophils Absolute Automated 0.03 x10 3/uL      Immature Granulocytes Absolute 0.02 x10 3/uL      Absolute NRBC 0.00 x10 3/uL     Hemolysis index [[id number]] Collected: 10/04/19 0949     Updated: 10/04/19 1010     Hemolysis Index 15    GFR [[id number]] Collected: 10/04/19 0949     Updated: 10/04/19 1010     EGFR >60.0    Comprehensive metabolic panel [[id number]] Collected: 10/04/19 0949    Specimen: Blood Updated: 10/04/19 1010     Glucose 92 mg/dL      BUN 52.8 mg/dL      Creatinine 1.0 mg/dL      Sodium 413 mEq/L      Potassium 4.0 mEq/L      Chloride 104 mEq/L      CO2 23 mEq/L      Calcium 9.4 mg/dL      Protein, Total 7.6 g/dL      Albumin 4.2 g/dL      AST (SGOT) 21 U/L      ALT 20 U/L      Alkaline Phosphatase 100 U/L      Bilirubin, Total 1.2 mg/dL      Globulin 3.4 g/dL      Albumin/Globulin Ratio 1.2     Anion Gap 10.0    Glucose Whole Blood - POCT [[id number]] Collected: 10/04/19 0936     Updated: 10/04/19 0938     Whole Blood Glucose POCT 84 mg/dL           Radiologic Studies -   Radiology Results (24 Hour)     Procedure Component Value Units Date/Time    CT Head WO Contrast [[id number]] Collected: 10/04/19 1934    Order Status: Completed Updated: 10/04/19 1956    Narrative:      Date: 10/04/2019 7:22 PM    HISTORY: Right thalamic hemorrhagic infarct     COMPARISON: Comparison study is CT head from 10/04/2019 at 1049 hours    PROCEDURE: Serial transaxial images are obtained at  5 mm intervals  through the calvarium. Sagittal and coronal reformatted images are  generated.  A combination of automatic exposure control, adjustment of the mA and/or  kV according to patient size  and/or use of iterative reconstruction  technique was utilized.    FINDINGS: The right thalamic hemorrhagic infarct is again noted and when  measured at approximately the same level this measures 22 x 11 mm and is  stable in size. There is surrounding edema with a millimeter of  right-to-left midline shift. The hemorrhage again extends into the  posterior horn and body of the right lateral ventricle and appears  stable in volume.    Ventricles and sulci are otherwise appropriate for age. No new  hydrocephalus. No new hemorrhage is seen. Benign basal ganglia  calcifications are again noted. The basilar cisterns appear intact. No  hyperdense vessels.    The visualized portions of the paranasal sinuses and mastoid air cells  are clear. No acute bony changes are identified.      Impression:         Essentially stable appearance to the hemorrhagic infarct involving the  right thalamus with intraventricular extension. No new hemorrhage or  hydrocephalus is seen. There is minimal, 1 mm, right to left midline  shift.    Ida Rogue, MD   10/04/2019 7:53 PM    XR Chest  AP Portable [[id number]] Collected: 10/04/19 1118    Order Status: Completed Updated: 10/04/19 1120    Narrative:      XR CHEST AP PORTABLE    CLINICAL INDICATION:   stroke    COMPARISON: 04/14/2013    FINDINGS: The cardiomediastinal silhouette appears within normal size  limits for portable technique and patient positioning. There is no  evidence for focal airspace consolidation, pleural effusion, or  pneumothorax. The pulmonary vascularity appears unremarkable.      Impression:       No acute pulmonary or pleural disease.    Sandie Ano, MD   10/04/2019 11:18 AM    CT Head WO Contrast [[id number]] Collected: 10/04/19 1058    Order Status: Completed Updated:  10/04/19 1115    Narrative:      EXAM: NONCONTRAST HEAD CT    CLINICAL HISTORY: 64 year old man, left-sided weakness, evaluate  right thalamic hemorrhage    TECHNIQUE: Multidetector axial CT images of the brain were  obtained without the use of intravenous contrast. Sagittal and coronal  reconstructions were created. The following dose reduction techniques  were utilized: automated exposure control and/or adjustment of the  mA and/or kV according to patient size, and the use of iterative  reconstruction technique.    COMPARISON: Head CT performed 10/04/2019 at 9:36 AM      FINDINGS:     Brain: Redemonstrated is an acute hemorrhage in the right thalamus  measuring 2.2 x 1.0 cm in greatest axial dimension, similar in  appearance to prior study. The blood extends into the right lateral  ventricle. There is mild surrounding edema. There is no midline shift or  transtentorial herniation. Patchy hypoattenuation in the periventricular  and subcortical white matter is consistent with chronic microvascular  changes. If there is clinical concern for acute on chronic pathology,  consider MRI for further evaluation secondary to its increased  sensitivity. Calcification is identified within bilateral basal ganglia.    Ventricles: The ventricles and sulci are diffusely enlarged, consistent  with age related volume loss. Intraventricular hemorrhage as above.    Calvarium, Sinuses, Mastoids and Orbits: Unremarkable.    Vasculature: Unremarkable.      Impression:          1.  Acute hemorrhagic infarct in the right thalamus with  intraventricular extension. No herniation or hydrocephalus.  Findings  similar to prior study.    2.  Chronic microvascular changes.     Raynelle Dick, MD   10/04/2019 11:13 AM    CT Head without Contrast [[id number]] Collected: 10/04/19 0944    Order Status: Completed Updated: 10/04/19 0952    Narrative:      EXAM: NONCONTRAST HEAD CT    CLINICAL HISTORY: 64 year old man, left-sided weakness    TECHNIQUE:  Multidetector axial CT images of the brain were  obtained without the use of intravenous contrast. Sagittal and coronal  reconstructions were created. The following dose reduction techniques  were utilized: automated exposure control and/or adjustment of the  mA and/or kV according to patient size, and the use of iterative  reconstruction technique.    COMPARISON: Head CT performed 09/21/2015      FINDINGS:     Brain: There is an acute hemorrhage in the right thalamus measuring 2.0  x 1.0 cm in greatest axial dimension. The blood extends into the right  lateral ventricle. There is mild surrounding edema. There is no midline  shift or transtentorial herniation. Patchy hypoattenuation in the  periventricular and subcortical white matter is consistent with chronic  microvascular changes. If there is clinical concern for acute on chronic  pathology, consider MRI for further evaluation secondary to its  increased sensitivity. Calcification is identified within bilateral  basal ganglia.    Ventricles: The ventricles and sulci are diffusely enlarged, consistent  with age related volume loss. Intraventricular hemorrhage as above.    Calvarium, Sinuses, Mastoids and Orbits: Unremarkable.    Vasculature: Unremarkable.    Dr. Chrisandra Netters discussed these findings with Dr. Benjaman Kindler on 10/04/2019 9:49 AM.        Impression:          1.  Acute hemorrhagic infarct in the right thalamus with  intraventricular extension. No herniation or hydrocephalus.    2.  Chronic microvascular changes.     Raynelle Dick, MD   10/04/2019 9:49 AM      .      Medical Decision Making   I am the first provider for this patient.    Vital Signs-Reviewed the patient's vital signs.     Patient Vitals for the past 12 hrs:   BP Temp Pulse Resp   10/04/19 1900 118/64 -- 70 15   10/04/19 1830 137/73 -- -- --   10/04/19 1807 -- -- 67 --   10/04/19 1805 -- -- 66 --   10/04/19 1800 153/80 -- 97 17   10/04/19 1700 135/88 -- 68 16   10/04/19 1600 132/68 98.4 F (36.9 C) 62  18   10/04/19 1530 133/75 -- 86 --   10/04/19 1525 101/58 -- 62 --   10/04/19 1521 104/56 -- 63 --   10/04/19 1516 142/77 -- 91 --   10/04/19 1510 127/77 -- 61 --   10/04/19 1500 146/72 -- (!) 113 18   10/04/19 1440 103/59 -- 64 --   10/04/19 1430 107/57 -- 66 --   10/04/19 1405 127/72 -- 66 14   10/04/19 1400 134/77 -- 65 15   10/04/19 1355 126/73 -- 64 14   10/04/19 1351 116/72 -- 65 15   10/04/19 1345 127/74 -- 65 13   10/04/19 1341 113/70 -- 64 14   10/04/19 1337 119/69 -- 75 --   10/04/19 1335 119/69 -- 64 14   10/04/19 1330 118/68 -- 63 22   10/04/19 1325 124/73 -- 68 21   10/04/19 1321 125/83 --  85 18   10/04/19 1310 114/59 -- 66 12   10/04/19 1305 136/72 -- 91 20   10/04/19 1300 124/67 -- 62 12   10/04/19 1255 138/74 -- 69 12   10/04/19 1250 144/72 -- 65 14   10/04/19 1245 142/72 -- 70 15   10/04/19 1240 132/73 -- 71 14   10/04/19 1235 149/75 -- 64 13   10/04/19 1231 130/69 -- 68 12   10/04/19 1226 (!) 201/100 -- (!) 116 16   10/04/19 1220 134/77 -- 62 (!) 11   10/04/19 1216 131/80 -- 63 12   10/04/19 1210 133/70 -- 65 12   10/04/19 1209 129/75 -- 68 12   10/04/19 1155 137/74 -- 70 13   10/04/19 1150 133/73 -- 67 14   10/04/19 1145 141/71 -- 67 (!) 11   10/04/19 1140 151/79 -- 69 13   10/04/19 1135 154/88 -- 78 (!) 10   10/04/19 1126 (!) 164/100 -- 85 16   10/04/19 1120 (!) 166/92 -- 82 13   10/04/19 1115 167/90 -- 81 14   10/04/19 1110 169/89 -- 79 15   10/04/19 1105 172/88 -- 96 22   10/04/19 1100 (!) 164/127 -- 84 15   10/04/19 1056 146/78 -- 66 13   10/04/19 1041 155/90 -- 76 --   10/04/19 1031 161/84 -- 62 18   10/04/19 1024 (!) 177/101 -- 74 18   10/04/19 1012 (!) 197/91 -- 70 --   10/04/19 0959 177/90 99.2 F (37.3 C) (!) 59 15   10/04/19 0939 (!) 186/97 -- 69 18       Pulse Oximetry Analysis - 99 % on ra. Nl     EKG:  Interpreted by the EP.   Time Interpreted:    Rate: 64   Rhythm: Normal Sinus Rhythm    Interpretation: st elevation in v2 /q wave       ED Course: d/w dr. Hosie Poisson who saw pt in the ed.  Recommends sbp<140  D/w Tresa Endo , ns pa who will see pt.   D/w dr. Jeryl Columbia, icu, who accepts         Provider Notes:       Critical Care Time: CRITICAL CARE: The high probability of sudden, clinically significant deterioration in the patient's condition required the highest level of my preparedness to intervene urgently.    The services I provided to this patient were to treat and/or prevent clinically significant deterioration that could result in: disability/death.  Services included the following: chart data review, reviewing nursing notes and/or old charts, documentation time, consultant collaboration regarding findings and treatment options, medication orders and management, direct patient care, re-evaluations, vital sign assessments and ordering, interpreting and reviewing diagnostic studies/lab tests.    Aggregate critical care time was 45  minutes, which includes only time during which I was engaged in work directly related to the patient's care, as described above, whether at the bedside or elsewhere in the Emergency Department.  It did not include time spent performing other reported procedures or the services of residents, students, nurses or physician assistants.      Diagnosis     Clinical Impression:   1. Acute CVA (cerebrovascular accident)    2. Acute cerebral hemorrhage    3. Hypertensive emergency        _______________________________    Attestations:  This note is prepared by Avanell Shackleton, MD.     Avanell Shackleton, MD.  I confirm that the note above accurately reflects  all work, treatment, procedures, and medical decision making performed by me.    _______________________________       Azzie Glatter, MD  10/04/19 2004

## 2019-10-04 NOTE — Progress Notes (Signed)
Went into patient's room for shift hand off NIH assessment at 1900 with RN Geralyn Flash however patient difficult to arouse, patient woke up to sternal rub but unable to stay awake. L pupil via pupillometer showed 7mm. NSGY called at 1904 however no response. Dr. Jeryl Columbia called at 1905 and STAT Head CT ordered. NP Lawanna Kobus assessed patient at bedside. Patient taken down for CT with bedside RN Shanel.

## 2019-10-04 NOTE — Progress Notes (Signed)
During change of shift neuro assessment @1900  conducted with Francetta Found, RN), noted patients Left pupil to be 7mm via pupillometer with right pupil measuring 4mm. This finding was a change from 1800 neuro assessment. Patient only arousable to painful stimuli (refer to documentation for complete assessment findings). ICU NP Lawanna Kobus) called and assessed patient at bedside. Neuro surgery contacted and ordered STAT head CT without contrast.  Patient taken down for head CT (view chart for results).  During head CT patient became fully alert and agitated attempting to move himself off of CT table.  Patient transferred from [facility] to CVNICU as per Neuro Surgery request. Patient transferred to CVNICU room 2716 around 2020. Bedside report given to receiving RN Noreene Larsson).

## 2019-10-04 NOTE — Plan of Care (Signed)
Pt admitted to unit from ED at 1600. CHG done. Dual skin assessment w/RN Samem. NP Tresa Endo aware of NPI of 3 for L eye. Plan for Q1 neuro checks and maintain SBP <140.    Problem: Safety  Goal: Patient will be free from injury during hospitalization  Outcome: Progressing  Flowsheets (Taken 10/04/2019 1635)  Patient will be free from injury during hospitalization:   Assess patient's risk for falls and implement fall prevention plan of care per policy   Provide and maintain safe environment   Ensure appropriate safety devices are available at the bedside   Use appropriate transfer methods   Include patient/ family/ care giver in decisions related to safety   Hourly rounding   Assess for patients risk for elopement and implement Elopement Risk Plan per policy   Provide alternative method of communication if needed (communication boards, writing)     Problem: Pain  Goal: Pain at adequate level as identified by patient  Outcome: Progressing  Flowsheets (Taken 10/04/2019 1635)  Pain at adequate level as identified by patient:   Identify patient comfort function goal   Assess for risk of opioid induced respiratory depression, including snoring/sleep apnea. Alert healthcare team of risk factors identified.   Assess pain on admission, during daily assessment and/or before any "as needed" intervention(s)   Reassess pain within 30-60 minutes of any procedure/intervention, per Pain Assessment, Intervention, Reassessment (AIR) Cycle   Evaluate if patient comfort function goal is met   Evaluate patient's satisfaction with pain management progress   Offer non-pharmacological pain management interventions     Problem: Day of Admission - Stroke  Goal: Core/Quality measure requirements - Admission  Outcome: Progressing  Flowsheets (Taken 10/04/2019 1635)  Core/Quality measure requirements - Admission:   Document NIH Stroke Scale on admission   Document nursing swallow/dysphagia screen on admission. If patient fails, keep  patient NPO (follow your hospital protocol on swallowing screening).   VTE Prevention: Ensure anticoagulant(s) administered and/or anti-embolism stockings/devices documented as ordered     Problem: Every Day - Stroke  Goal: Neurological status is stable or improving  Outcome: Progressing  Flowsheets (Taken 10/04/2019 1635)  Neurological status is stable or improving:   Monitor/assess/document neurological assessment (Stroke: every 4 hours)   Monitor/assess NIH Stroke Scale   Observe for seizure activity and initiate seizure precautions if indicated   Re-assess NIH Stroke Scale for any change in status   Perform CAM Assessment  Goal: Stable vital signs and fluid balance  Outcome: Progressing  Flowsheets (Taken 10/04/2019 1635)  Stable vital signs and fluid balance:   Position patient for maximum circulation/cardiac output   Monitor and assess vitals every 4 hours or as ordered and hemodynamic parameters   Monitor intake and output. Notify LIP if urine output is < 30 mL/hour.   Encourage oral fluid intake   Apply telemetry monitor as ordered

## 2019-10-04 NOTE — Consults (Signed)
NEUROSURGERY ATTENDING - CONSULTATION NOTE      Date: 10/04/2019  Patient Name: Michael White, Michael SR.    Please CC and send a report to:  Patient Care Team:  Pcp, None, MD as PCP - General  Worek, Valerie Salts, MA  Slottow, Gwendolyn Fill, MD as Consulting Physician (Cardiology)  Cherylynn Ridges, [place]  Ree Kida, NP as Nurse Practitioner (Nurse Practitioner)      Diagnosis / Chief Complaint:     Right thalamic hemorrhage with intraventricular extension      History of Present Illness:     I was asked by Dr. Orlan Leavens to see Michael Haggard Sr. in consultation in order to render my professional opinion regarding the aforementioned chief complaint.    Michael Haggard Sr. is a 64 y.o. righthanded male with PMH of HTN not on medical therapy.  He was brought to ED my EMS after being found in his care incontinent with slurred speech and left sided weakness.  Blood pressure for EMS was 213/86.  Patient reports feeling weak and fatigued.  He says he has not taken his blood pressure medication in years.  He denies HA, N/V, or vision changes.  He is a non-smoker and denies the use of ASA or blood thinners.      History was obtained from chart review and the patient.      Review of Systems:     A comprehensive review of systems was performed and revealed the following:    Constitutional: negative for - fever, chills, sweating, fatigue, change in activity  General: negative for - recent weight gain, recent weight loss, appetite change, headaches  Eyes: negative for - blurry vision, double vision, eye pain, light sensitivity, eye discharge  ENT: negative for - hearing loss, nasal discharge, hoarseness, sore throat, swallowing difficulty  Heart: negative for - chest pain or tightness, palpitations, fainting, other heart trouble  Lungs: negative for - chronic cough, shortness of breath, wheezing, sleep apnea, hemoptysis  Gastrointestinal: negative for - abdominal pain, nausea, vomiting, diarrhea, constipation, bloody stool  Genito-Urinary: negative  for - urinary retention, urinary incontinence, urinary urgency, urinary discharge  Musculoskeletal: negative for - joint swelling, joint pain, leg swelling, leg cramping, fractures  Neurological: negative for - dizziness, tremors, memory loss, speech difficulty, seizures  Hematological and Lymphatic: negative for - easy bleeding, easy bruising, swollen nodes  Skin: negative for - rash, enlarged glands, moles or skin lesions, color change, infection  Psychaitric: negative for - anxiety, depression, confusion, excessive stress, suicidal thoughts    All other systems were reviewed by me and are negative.      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    Borderline hypertension     on meds in past; BP normal without meds recently       Past Surgical History:     Past Surgical History:   Procedure Laterality Date    CARDIAC ABLATION  04/15/2013    ablation of slow pathway for AVNRT       Family History:     Family History   Problem Relation Age of Onset    Heart disease Father         unknown what kind but not MI       Social History:     Social History  Socioeconomic History    Marital status: Legally Separated     Spouse name: None    Number of children: None    Years of education: None    Highest education level: None   Occupational History    None   Tobacco Use    Smoking status: Former Smoker     Packs/day: 0.50     Years: 14.00     Pack years: 7.00     Types: Cigarettes     Quit date: 08/28/2012     Years since quitting: 7.1    Smokeless tobacco: Never Used   Substance and Sexual Activity    Alcohol use: Yes     Comment: occ beer some weekends    Drug use: No    Sexual activity: None   Other Topics Concern    None   Social History Narrative    None     Social Determinants of Health     Financial Resource Strain:     Difficulty of Paying  Living Expenses:    Food Insecurity:     Worried About Programme researcher, broadcasting/film/video in the Last Year:     Barista in the Last Year:    Transportation Needs:     Freight forwarder (Medical):     Lack of Transportation (Non-Medical):    Physical Activity:     Days of Exercise per Week:     Minutes of Exercise per Session:    Stress:     Feeling of Stress :    Social Connections:     Frequency of Communication with Friends and Family:     Frequency of Social Gatherings with Friends and Family:     Attends Religious Services:     Active Member of Clubs or Organizations:     Attends Banker Meetings:     Marital Status:    Intimate Partner Violence:     Fear of Current or Ex-Partner:     Emotionally Abused:     Physically Abused:     Sexually Abused:        Allergies:     No Known Allergies      Medications:     Scheduled Meds:  Current Facility-Administered Medications   Medication Dose Route Frequency     Continuous Infusions:   niCARdipine 10 mg/hr (10/04/19 1032)         Vital Signs:     Vitals:    10/04/19 1031   BP: 161/84   Pulse: 62   Resp: 18   Temp:    SpO2: 99%       Physical Exam:     General: No acute distress, cooperative with examination  Psychologic: Affect appropriate, judgment and insight consistent with situation, no delusions or hallucinations  Skin: Warm, dry, no obvious lesions or scars  Eyes: Sclerae anicteric, no conjunctival injection  ENT: No otorrhea, no rhinorrhea, trachea midline  Head: Normocephalic  Neck: No palpable masses  Musculoskeletal: Full ROM, normal muscle tone, no atrophy  Pulmonary: Normal respiratory effort, no audible wheezing  Cardiovascular: No pedal edema, pulses 2+ in bilat lower extremities  Abdominal: Non-tender to palpation, non-distended, no organomegaly, no palpable masses    Neuro exam:   Awake, alert, orientedx3  Dysarthric   Attention span normal  PERRL, EOMI tracks across room   Facial sensation intact  Left facial droop   Hearing  intact to conversation   Tongue midline  Shoulder shrug weak  on left   Motor:   Arms:      Deltoid  Bicep Tricep Grip   Right 5 5 5 5    Left  4 3 3 4        Legs:      HF KE KF DF PF EHL   Right 5 5 5 5 5 5    Left  4 4 4 4 4 4      Mild left pronator drift   Light touch  intact   DTRs:      Biceps Triceps Brachiorad Patellar Ankle   Right 2+ 2+ 2+ 2+ 2+   Left  2+ 2+ 2+ 2+ 2+       RUE: full strength   LUE: antigravity     RLE: full strength   ZOX:WRUE gravity, drifts to midline     Gait not assessed due to clinical condition    GCS 15      Labs:     Lab Results   Component Value Date    WBC 7.91 10/04/2019    HGB 12.7 10/04/2019    HCT 42.3 10/04/2019    MCV 68.9 (L) 10/04/2019    PLT 329 10/04/2019     Lab Results   Component Value Date    NA 137 10/04/2019    K 4.0 10/04/2019    CL 104 10/04/2019    CO2 23 10/04/2019     Lab Results   Component Value Date    INR 1.2 (H) 10/04/2019    PT 13.8 (H) 10/04/2019     Lab Results   Component Value Date    BUN 11.0 10/04/2019     Lab Results   Component Value Date    CREAT 1.0 10/04/2019       Imaging:     Radiology Results (24 Hour)     Procedure Component Value Units Date/Time    CT Head without Contrast [[id number]] Collected: 10/04/19 0944    Order Status: Completed Updated: 10/04/19 0952    Narrative:      EXAM: NONCONTRAST HEAD CT    CLINICAL HISTORY: 64 year old man, left-sided weakness    TECHNIQUE: Multidetector axial CT images of the brain were  obtained without the use of intravenous contrast. Sagittal and coronal  reconstructions were created. The following dose reduction techniques  were utilized: automated exposure control and/or adjustment of the  mA and/or kV according to patient size, and the use of iterative  reconstruction technique.    COMPARISON: Head CT performed 09/21/2015      FINDINGS:     Brain: There is an acute hemorrhage in the right thalamus measuring 2.0  x 1.0 cm in greatest axial dimension. The blood extends into the right  lateral ventricle.  There is mild surrounding edema. There is no midline  shift or transtentorial herniation. Patchy hypoattenuation in the  periventricular and subcortical white matter is consistent with chronic  microvascular changes. If there is clinical concern for acute on chronic  pathology, consider MRI for further evaluation secondary to its  increased sensitivity. Calcification is identified within bilateral  basal ganglia.    Ventricles: The ventricles and sulci are diffusely enlarged, consistent  with age related volume loss. Intraventricular hemorrhage as above.    Calvarium, Sinuses, Mastoids and Orbits: Unremarkable.    Vasculature: Unremarkable.    Dr. Chrisandra Netters discussed these findings with Dr. Benjaman Kindler on 10/04/2019 9:49 AM.        Impression:  1.  Acute hemorrhagic infarct in the right thalamus with  intraventricular extension. No herniation or hydrocephalus.    2.  Chronic microvascular changes.     Raynelle Dick, MD   10/04/2019 9:49 AM          I reviewed the patient's imaging myself. My own interpretation of the CT of the head without contrast is that it shows an acute right thalamic hemorrhage measuring approximately 2 cm x 1 cm in largest dimensions, and extending into the right lateral ventricle.  There is also evidence of ventriculomegaly concerning for developing hydrocephalus.      Assessment:     64 y.o. male presenting after being found confused and altered.  The patient was very hypertensive at presentation, with blood pressure 213/86.  CT of the head reveals an acute right thalamic hemorrhage, most likely hypertensive, with extension into the right lateral ventricle, as well as early findings of ventriculomegaly.  On examination, the patient has left facial droop and left-sided weakness and dysarthria.      Plan:     I had an extensive discussion with the patient regarding his condition.  I told him the imaging and went over its findings in detail.  I explained to the patient that he had hypertensive  crisis resulting in bleeding in the brain.  I also explained to the patient that he has extension of the bleeding into the ventricle, which can result in developing hydrocephalus over time.  For this reason, I recommended even the patient in intensive care unit for every hour neurochecks and every hour vitals.  I also recommended strictly controlling the patient's blood pressure, with strict systolic blood pressure control less than 140.  The patient also needs PT/OT/SLP.  The patient should be placed on hydrocephalus watch.  I will continue to follow him very closely.  All questions were answered.    Summary of recommendations:  - Q 1hr neuro checks and vitals  - HOB > 30 degrees  - SBP goal < 140 mm hg   - No bloodthinners / ASA / NSAIDs   - PT / OT / SLP  - No EVD planned for this time.  Will watch closely for development of hydrocephalus and extension of intraventricular hemorrhage    My above findings and recommendations were also discussed with the patient's attending, Dr.Nayyar and Neurologist Dr. Hosie Poisson     D/w family Katherina Mires (girlfriend)  Spoke with and updated adult biological son Benjamine [phone])     Thank you for the opportunity of allowing me to participate in the care of Michael WhiteMarland Kitchen      Orland Jarred AGACNP-BC    Greig Castilla A. Rohil Lesch, MD FAANS  Section Chief of Neurological Surgery   [facility]  Minimally Invasive and Complex Spine Surgery & General Neurosurgery  Department of Neurosciences   [facility] Neuro[facility]  Assistant Professor of Neurosurgery   [facility]  Address: [address]   [place], [place] [address]  Phone [phone]   Fax [phone]

## 2019-10-04 NOTE — Consults (Addendum)
[facility] Consultation Note                                       Date Time: 10/04/19 9:44 AM  Patient Name: Michael White, Michael SR.  Requesting Physician: Azzie Glatter, MD  Date of Admission: 10/04/2019    CC / Reason for Consultation: left-sided weakness           Assessment:     64y/o gentleman with hx of HTN presenting with sudden onset of left sided weakness and slurred speech. CT head imaging reviewed and note an acute ICH in the right basal ganglia with small amount of intraventricular extension.     Suspect hypertensive etiology of his ICH.       Plan:     -Admit to ICU given risk of deterioration and expansion of hemorrhage  -NSG consult  -will repeat head CT due to decline in mental status  -MRI/repeating imaging re-etioloy per neurosurgery  -hold antithrombotics and antiplatelet agents  -SBP 110-140  -STAT CT head wo if change in exam  -HOB at 30 deg   -neurology will follow    Case discussed with ED attending, Dr Benjaman Kindler, and neurosurgery APP Tresa Endo      HPI   Michael Haggard Sr. is a 64 y.o. male with hx of HTN brought in after being found in his truck this morning with confusion and left sided weakness. LKW was 2200 on 05/02. This morning, around 0845, his coworkers found him sitting in his truck, disoriented and unsure how long he had been there. They also noted he appeared weak on the left side. He denies any current concerns. Denies any prior stroke or TIA history. Reports he has not been to a PCP in a long time. No recent head or neck trauma. Not on any antiplatelet or anticoagulation. 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    Borderline hypertension     on meds in past; BP normal without meds recently          Past Surgical Hx:     Past Surgical History:   Procedure Laterality Date    CARDIAC ABLATION  04/15/2013    ablation of slow pathway for AVNRT        Family Medical History:      Family History   Problem Relation Age of Onset    Heart disease Father         unknown what kind but not MI       Social Hx     Social History     Socioeconomic History    Marital status: Legally Separated     Spouse name: Not on file    Number of children: Not on file    Years of education: Not on file    Highest education level: Not on file   Occupational History    Not on file   Tobacco Use  Smoking status: Former Smoker     Packs/day: 0.50     Years: 14.00     Pack years: 7.00     Types: Cigarettes     Quit date: 08/28/2012     Years since quitting: 7.1    Smokeless tobacco: Never Used   Substance and Sexual Activity    Alcohol use: Yes     Comment: occ beer some weekends    Drug use: No    Sexual activity: Not on file   Other Topics Concern    Not on file   Social History Narrative    Not on file     Social Determinants of Health     Financial Resource Strain:     Difficulty of Paying Living Expenses:    Food Insecurity:     Worried About Programme researcher, broadcasting/film/video in the Last Year:     Barista in the Last Year:    Transportation Needs:     Freight forwarder (Medical):     Lack of Transportation (Non-Medical):    Physical Activity:     Days of Exercise per Week:     Minutes of Exercise per Session:    Stress:     Feeling of Stress :    Social Connections:     Frequency of Communication with Friends and Family:     Frequency of Social Gatherings with Friends and Family:     Attends Religious Services:     Active Member of Clubs or Organizations:     Attends Engineer, structural:     Marital Status:    Intimate  Partner Violence:     Fear of Current or Ex-Partner:     Emotionally Abused:     Physically Abused:     Sexually Abused:        Meds     Home :   Prior to Admission medications    Medication Sig Start Date End Date Taking? Authorizing Provider   acetaminophen (TYLENOL) 325 MG tablet Take 2 tablets (650 mg total) by mouth every 4 (four) hours as needed for Pain 05/24/18   Johnsie Cancel, Stacy L, DO   cyclobenzaprine (FLEXERIL) 5 MG tablet Take 1 tablet (5 mg total) by mouth 2 (two) times daily as needed for Muscle spasms 05/24/18   Johnsie Cancel, Stacy L, DO   ibuprofen (ADVIL,MOTRIN) 800 MG tablet Take 1 tablet (800 mg total) by mouth every 8 (eight) hours as needed for Pain. 09/23/15   Donny Pique, MD      Inpatient :       Allergies    Patient has no known allergies.      Review of Systems     All other systems were reviewed and are negative except for that mentioned in the HPI    Physical Exam:   Heart Rate:  [69] 69  Resp Rate:  [18] 18  BP: (186)/(97) 186/97       General: in no acute distress. Cooperative with the exam  Neck: supple  CVS: warm and well perfused  Resp: no respiratory distress  Extremities: no pedal edema, no rashes noted    Neurological Examination:  MSE: mildly lethargic but easily aroused to voice, A&Ox3, speech fluent without word-finding difficulty or paraphasic errors, simple naming, reading and repetition intact  CN: Pupils 4-->56mm b/l, right gaze preference but will look fully to the left, VFFC, reports sensation in V1-V3 intact to LT/temp, smile symmetric, tongue and  palate midline.  mild dysarthria.  Motor: 5/5 strength in RUE and RLE. LUE proximal 4/5, distal 4/5. LLE proximal and distal 4/5.  Sensory: Intact and symmetric to LT/temp x4 extremities.  No extinction to visual or tactile DSS.  Coord: FNF, RAM without limb ataxia or dysmetria.  Gait: deferred  Reflexes: DTRs 2+ throughout, toes down        Labs:     Results     Procedure Component Value Units Date/Time    Glucose Whole Blood  - POCT [[id number]] Collected: 10/04/19 0936     Updated: 10/04/19 0938     Whole Blood Glucose POCT 84 mg/dL           Rads:     Results for orders placed or performed during the hospital encounter of 09/23/15   CT Head WO Contrast    Narrative    History: Trauma.    Comparisons: 04/14/2013.    Technique: Axial CT brain obtained from vertex to skull base without  contrast. Axial CT facial bones with coronal and sagittal  reconstructions.    A combination of automatic exposure control, adjustment of the mA and/or  kV according to patient size and/or use of iterative reconstruction  technique was utilized.    Findings:  Gray-white matter differentiation preserved.        Basal ganglia, thalami, midbrain, pons unremarkable.    No mass, mass effect, or midline shift. No intra-axial or extra-axial  hemorrhage or collection.    Ventricles, sulci, cisterns age-appropriate in size and configuration.    Calvarium appears intact.         No maxillofacial bone fractures or bony dehiscence.     Orbits and globes intact.     Frontal sinuses, sphenoid sinuses, maxillary sinuses, and ethmoid air  cells clear. Mastoid air cells, middle ear cavities clear.    Soft tissue unremarkable.      Impression       No acute intracranial or maxillofacial abnormality.    Adaline Sill, MD   09/23/2015 11:04 PM             Elspeth Cho  [facility] Neurology  Spectralink [phone]

## 2019-10-04 NOTE — ED Triage Notes (Signed)
Pt biba from his car, LKW last night 2200. Pt might have been in his car all night, unclear, pt was also incontinent. Sluggist to respond, slurred speech, weakness to left arm and left leg, decreased strength to left arm and left leg. Denies headache. BS 85, BP 213/86 for EMS

## 2019-10-04 NOTE — ED Notes (Signed)
Pt found standing in his room naked trying to urinate. IV to left arm came out in the process. Sitter at the bedside now

## 2019-10-05 DIAGNOSIS — I639 Cerebral infarction, unspecified: Secondary | ICD-10-CM

## 2019-10-05 LAB — ECG 12-LEAD
Atrial Rate: 64 {beats}/min
P Axis: 62 degrees
P-R Interval: 148 ms
Q-T Interval: 392 ms
QRS Duration: 80 ms
QTC Calculation (Bezet): 404 ms
R Axis: -78 degrees
T Axis: 15 degrees
Ventricular Rate: 64 {beats}/min

## 2019-10-05 LAB — COMPREHENSIVE METABOLIC PANEL
ALT: 18 U/L (ref 0–55)
AST (SGOT): 24 U/L (ref 5–34)
Albumin/Globulin Ratio: 1.2 (ref 0.9–2.2)
Albumin: 3.9 g/dL (ref 3.5–5.0)
Alkaline Phosphatase: 95 U/L (ref 38–106)
Anion Gap: 9 (ref 5.0–15.0)
BUN: 9 mg/dL (ref 9.0–28.0)
Bilirubin, Total: 1.7 mg/dL — ABNORMAL HIGH (ref 0.2–1.2)
CO2: 23 mEq/L (ref 22–29)
Calcium: 9.3 mg/dL (ref 8.5–10.5)
Chloride: 105 mEq/L (ref 100–111)
Creatinine: 0.9 mg/dL (ref 0.7–1.3)
Globulin: 3.2 g/dL (ref 2.0–3.6)
Glucose: 95 mg/dL (ref 70–100)
Potassium: 3.8 mEq/L (ref 3.5–5.1)
Protein, Total: 7.1 g/dL (ref 6.0–8.3)
Sodium: 137 mEq/L (ref 136–145)

## 2019-10-05 LAB — CBC AND DIFFERENTIAL
Absolute NRBC: 0 10*3/uL (ref 0.00–0.00)
Basophils Absolute Automated: 0.03 10*3/uL (ref 0.00–0.08)
Basophils Automated: 0.4 %
Eosinophils Absolute Automated: 0.07 10*3/uL (ref 0.00–0.44)
Eosinophils Automated: 1 %
Hematocrit: 41 % (ref 37.6–49.6)
Hgb: 12.7 g/dL (ref 12.5–17.1)
Immature Granulocytes Absolute: 0.02 10*3/uL (ref 0.00–0.07)
Immature Granulocytes: 0.3 %
Lymphocytes Absolute Automated: 2.07 10*3/uL (ref 0.42–3.22)
Lymphocytes Automated: 30.7 %
MCH: 21.1 pg — ABNORMAL LOW (ref 25.1–33.5)
MCHC: 31 g/dL — ABNORMAL LOW (ref 31.5–35.8)
MCV: 68 fL — ABNORMAL LOW (ref 78.0–96.0)
MPV: 10 fL (ref 8.9–12.5)
Monocytes Absolute Automated: 0.61 10*3/uL (ref 0.21–0.85)
Monocytes: 9.1 %
Neutrophils Absolute: 3.94 10*3/uL (ref 1.10–6.33)
Neutrophils: 58.5 %
Nucleated RBC: 0 /100 WBC (ref 0.0–0.0)
Platelets: 163 10*3/uL (ref 142–346)
RBC: 6.03 10*6/uL — ABNORMAL HIGH (ref 4.20–5.90)
RDW: 21 % — ABNORMAL HIGH (ref 11–15)
WBC: 6.74 10*3/uL (ref 3.10–9.50)

## 2019-10-05 LAB — HEMOGLOBIN A1C
Average Estimated Glucose: 99.7 mg/dL
Hemoglobin A1C: 5.1 % (ref 4.6–5.9)

## 2019-10-05 LAB — LIPID PANEL
Cholesterol / HDL Ratio: 2.9
Cholesterol: 169 mg/dL (ref 0–199)
HDL: 59 mg/dL (ref 40–9999)
LDL Calculated: 94 mg/dL (ref 0–99)
Triglycerides: 78 mg/dL (ref 34–149)
VLDL Calculated: 16 mg/dL (ref 10–40)

## 2019-10-05 LAB — HEMOLYSIS INDEX
Hemolysis Index: 25 — ABNORMAL HIGH (ref 0–18)
Hemolysis Index: 27 — ABNORMAL HIGH (ref 0–18)
Hemolysis Index: 57 — ABNORMAL HIGH (ref 0–18)

## 2019-10-05 LAB — PT AND APTT
PT INR: 1.2 — ABNORMAL HIGH (ref 0.9–1.1)
PT: 14.2 s — ABNORMAL HIGH (ref 10.1–12.9)
PTT: 31 s (ref 27–39)

## 2019-10-05 LAB — MAGNESIUM: Magnesium: 1.6 mg/dL (ref 1.6–2.6)

## 2019-10-05 LAB — GFR: EGFR: >60

## 2019-10-05 LAB — PHOSPHORUS: Phosphorus: 3.8 mg/dL (ref 2.3–4.7)

## 2019-10-05 MED ORDER — HYDRALAZINE HCL 25 MG PO TABS
25.0000 mg | ORAL_TABLET | Freq: Four times a day (QID) | ORAL | Status: DC | PRN
Start: 2019-10-05 — End: 2019-10-08
  Administered 2019-10-06 – 2019-10-07 (×2): 25 mg via ORAL
  Filled 2019-10-05 (×2): qty 1

## 2019-10-05 MED ORDER — HYDRALAZINE HCL 25 MG PO TABS
25.0000 mg | ORAL_TABLET | Freq: Three times a day (TID) | ORAL | Status: DC
Start: 2019-10-05 — End: 2019-10-06
  Administered 2019-10-05: 23:00:00 25 mg via ORAL
  Filled 2019-10-05 (×2): qty 1

## 2019-10-05 MED ORDER — ACETAMINOPHEN 325 MG PO TABS
650.0000 mg | ORAL_TABLET | Freq: Four times a day (QID) | ORAL | Status: DC | PRN
Start: 2019-10-05 — End: 2019-10-08
  Administered 2019-10-05 – 2019-10-07 (×5): 650 mg via ORAL
  Filled 2019-10-05 (×5): qty 2

## 2019-10-05 MED ORDER — AMLODIPINE BESYLATE 5 MG PO TABS
5.0000 mg | ORAL_TABLET | Freq: Every day | ORAL | Status: DC
Start: 2019-10-05 — End: 2019-10-08
  Administered 2019-10-05 – 2019-10-08 (×4): 5 mg via ORAL
  Filled 2019-10-05 (×4): qty 1

## 2019-10-05 NOTE — Treatment Plan (Signed)
FOUR EYES SKIN ASSESSMENT NOTE    Michael CASTIGLIA Sr.  [birth year]  [id number]    Braden Scale Score: 14    POC Initiated for Risk for Altered Skin Yes    Patient Assessed for Correct Mattress Surface Yes  *At risk patients with Braden Score less than 12 must be considered for specialty bed    Mepilex or Adhesive Foam Dressing applied to sacrum/heel if any PI risk factors present: Yes    If Wound/Pressure Injury present:    Wound/PI assessment documented in EHR: No      Admitting physician notified: No      Wound consult ordered: No      Julio Alm, RN  Oct 05, 2019  7:27 PM    Second RN/PCT Name:

## 2019-10-05 NOTE — SLP Eval Note (Signed)
[facility]  Speech and Language Therapy Bedside Swallow Evaluation     Patient:  Michael STINEMAN Sr. MRN#:  [id number]  Unit:  [place] [facility]URO [facility]ENS[facility] Room/Bed:  A2716/A2716-01    Time of Treatment:   Time Calculation  SLP Received On: 10/05/19  Start Time: 1000  Stop Time: 1020  Time Calculation (min): 20 min    Consult received for Michael Haggard Sr. for SLP Bedside Swallow Evaluation and Treatment.    Medical Diagnosis: Acute cerebral hemorrhage [I61.9]  Hypertensive emergency [I16.1]  Acute CVA (cerebrovascular accident) [I63.9]    History of Present Illness: Michael White. is a 64 y.o. male admitted on 10/04/2019 with   past medical history significant of hypertension who has not seen any physician for many years what brought into emergency room by paramedics when he was found confused by his coworker.  CT of the head done in emergency room showed acute hemorrhagic infarction in the right thalamus.  At present patient is awake alert oriented to self however has been intermittently confused.  CT head w/o contrast  IMPRESSION:      Essentially stable appearance to the hemorrhagic infarct involving the  right thalamus with intraventricular extension. No new hemorrhage or  hydrocephalus is seen. There is minimal, 1 mm, right to left midline  shift.  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    Borderline hypertension     on meds in past; BP normal without meds recently      Past Surgical History:   Procedure Laterality Date     CARDIAC ABLATION  04/15/2013    ablation of slow pathway for AVNRT         History/Current Status:  Current Status  Respiratory Status: O2 via nasal cannula  Behavior/Mental Status: Awake/alert, Able to follow directions, Cooperative    Subjective: Patient is agreeable to participation in the therapy session. Nursing clears patient for therapy. Patients medical condition is appropriate for Speech therapy intervention at this time.    Objective:  Observation of Patient/Vital Signs:  Patient is in bed with telemetry in place.    Oral Motor Skills:  Engineer, maintenance (IT) Skills: exceptions to [facility]  Oral Motor Impairments: coordination, strength, dysarthria, dysphonia    Deglutition Skills:  Deglutition Skills  Position: upright 90 degrees  Food(s) Tested: thin liquid, puree, soft solid, solid  Oral Stage: bolus formation/control reduced, slow but effective, suspect AP propulsion reduced       Oral Stage Residuals: Scattered  Pharyngeal Stage: suspect delayed response    Assessment:     Oral motor exam revealed mild decrease in strength and coordination . Pt is mildly dysarthric with hypophonia. Pt presents with mild to moderate oral pharyngeal dysphagia characterized by mild delay in oral prep time, prolonged mastication with solid, minimal oral stasis following swallowing of solid. Pharyngeal phase was mildly delayed in trigger along with adequate Hyolaryngeal excursion per digital palpation. There was no overt  s/s aspiration or penetration with pureed x30 ccs, solid x10 and thin liquid x 4 fl oz via straw   Cognitive assessment revealed   Cognitive Status and Neuro Exam: Patient is alert. [place] Cognitive Assessment [facility]) completed. Overall score: 22/30. Subtests as follows: Visuospatial/executive: 3/5; Naming: 3/3; Attention(digits): 2/2; Attention(given letter in sequence): 0/1;Serial 7 subtraction: 2/3; Repetition: 2/2;Fluency: 1/1  Abstraction: 2/2; Delayed recall: 2/5; Orientation: 5/6.    Patient  100% general reasoning/safety questions. Patient able to recall current [place] president, home address, emergency 911, call bell use. Patient repeated some information that was extensively discussed to SLP more than 1x       OAuditory Comprehension:Patient able to follow flow of conversation. Patient demonstrated difficulty consistently following complex directions on MOCA, suspect 2/2 reduced short-term memory. Overall pt noted with delayed processing time in all target tasks.     Reading Comprehension: Paragraph reading and immediate story retell in tact.        Expression: Verbal                   Pragmatics: Good eye contact and conversational turn-taking       Behavior: Cooperative, pleasant        Educated the patient to role of speech therapy, plan of care, goals of therapy and results. Discussed with RN, CM and attending      Goals:   Pt will follow multi step commands with min cues 80% of given opportunities.  Patient will recall 2/3 unrelated items with max cues after 3 minute delay  Patient will name 10 items to given category in 60 seconds with mod cues      Goals:     Patient will adhere to swallow safety precautions 100% of the time during meals.  Pt will demonstrate adequate oral phase in order to safely manage mechanical soft and thin liquids for 3 meals /day without overt s/s aspiration and use of min cues for comp swallowing strategies.   Pt will use strategies to improve swallow function to tolerate the least restrictive diet without s/s aspiration or penetration provided with supervision.      Plan/Recommendations:     Follow up treatments: diet monitoring, strategies training     Diet Solids Recommendation: mechanical soft  Diet Liquids Recommendations: thin consistency  Precautions/Compensations: Awake/alert, Upright 90 degrees for all oral intake, 45 degrees upright after meals, Alternate solids and liquids, Swallow multiple times per bite/sip  Recommendation Discussed With: : Patient, Nurse  SLP  Frequency Recommended: follow-up visit only  Administration of Medications: PO  D/w RN diet texture recommendation, RN authorized Clinical research associate to place a diet order in Epic     10/05/2019  Presley Raddle MS [facility] SLP   10/05/2019 2:01 PM  [phone]

## 2019-10-05 NOTE — UM Notes (Addendum)
Self Pay  UTILIZATION REVIEW CONTACT : Eddie North BSN, RN, ACM  Utilization Review Case Manager II / Case Management  [facility]  [address]  [place], [place] [address]  T [phone]  Judie Petit [phone]   F [phone]    Email: Michael White  NPI: [id number] Tax ID:  [id number]    DIAGNOSIS    ICD-10-CM    1. Acute CVA (cerebrovascular accident)  I63.9    2. Acute cerebral hemorrhage  I61.9    3. Hypertensive emergency  I16.1              Admission Orders    Admit to Inpatient (Order [id number]) 10/04/19 1121  Diagnosis: Acute Cva (Cerebrovascular Accident)    Level of Care: ICU    Patient Class: Inpatient       References: IAH Bed Placement CriteriaIFMC Bed Placement CriteriaIFOH Bed Placement CriteriaILH Bed Placement CriteriaIMVH Bed Placement Criteria   Question Answer Comment   Admitting Physician Jeryl Columbia, RASHID    Service: Medicine    Estimated Length of Stay > or = to 2 midnights    Tentative Discharge Plan? Home or Self Care    Does patient need telemetry? Yes    Telemetry type (separate Telemetry order is also required): Medical telemetry               PATIENT NAME: Michael White, Michael SR.  DOB: 1956-04-16  PMH:  has a past medical history of AVNRT (AV nodal re-entry tachycardia) (04/2013) and Borderline hypertension.  PSH:  has a past surgical history that includes CARDIAC ABLATION (04/15/2013).    ADMISSION REVIEW   History of present illness: Pt is a 64 y.o. male [facility] on 10/04/2019 at 0928.and admitted to CVICU    Additional History: Michael Bartko. is a 64 y.o. male with a h/o HTN but not on any medication for years. No current PCP. Pt says that his coworkers found him altered this am. Medics told nurse that pt might have been in his car all night. Pt was incontinent and noted to have left sided weakness. Pt says that he is in the process of quitting smoking and drinking and his last use was middle of April.       Arrival VS: Vitals BP: (!)  186/97, Temp: 99.2 F (37.3 C), Temp Source: Oral, Heart Rate: 69, Resp Rate: 18, SpO2: 99 %, Height: 180.3 cm (5\' 11" ), Weight: 71.4 kg (157 lb 8 oz)      BP 142/84    Pulse 78    Temp 98.5 F (36.9 C) (Axillary)    Resp (!) 29    Ht 1.803 m (5\' 11" )    Wt 58.6 kg (129 lb 3 oz)    SpO2 99%    BMI 18.02 kg/m     Temp:  [97.8 F (36.6 C)-98.7 F (37.1 C)]   Heart Rate:  [58-112]   Resp Rate:  [13-35]   BP: (100-182)/(53-104)   SpO2:  [97 %-100 %]   Height:  [180.3 cm (5\' 11" )]   Weight:  [58.6 kg (129 lb 3 oz)]     Last recorded pain score:  Pain Scale Used: Numeric Scale (0-10)  Pain Score: 8-severe pain         Abnormal Labs:   Lab Results last 48 Hours     Procedure Component Value Units Date/Time    Hemolysis index [[id number]]  (Abnormal) Collected: 10/05/19 3295     Updated: 10/05/19 1884  Hemolysis Index 57    Narrative:      This is NOT the correct Test for Patients with  Hemoglobinopathy.    Magnesium [[id number]] Collected: 10/05/19 0313    Specimen: Blood Updated: 10/05/19 0522     Magnesium 1.6 mg/dL     Phosphorus [[id number]] Collected: 10/05/19 0313    Specimen: Blood Updated: 10/05/19 0522     Phosphorus 3.8 mg/dL     Hemolysis index [[id number]]  (Abnormal) Collected: 10/05/19 0313     Updated: 10/05/19 0522     Hemolysis Index 27    PT/APTT [[id number]]  (Abnormal) Collected: 10/05/19 0313     Updated: 10/05/19 0418     PT 14.2 sec      PT INR 1.2     PTT 31 sec     Comprehensive metabolic panel [[id number]]  (Abnormal) Collected: 10/05/19 0313     Bilirubin, Total 1.7 mg/dL      Globulin 3.2 g/dL      Albumin/Globulin Ratio 1.2     Anion Gap 9.0    Hemolysis index [[id number]]  (Abnormal) Collected: 10/05/19 0313     Updated: 10/05/19 0414     Hemolysis Index 25    GFR [[id number]] Collected: 10/05/19 0313     Updated: 10/05/19 0414     EGFR >60.0    CBC and differential [[id number]]  (Abnormal) Collected: 10/05/19 0313     RBC 6.03 x10 6/uL      MCV 68.0 fL      MCH 21.1 pg      MCHC 31.0 g/dL       RDW 21 %     SAYTK-16 (SARS-COV-2) ([place] [facility]) [[id number]] Collected: 10/04/19 1202    Specimen: Nasopharyngeal Swab from Nasopharynx Updated: 10/04/19 1245     Purpose of COVID testing Screening     SARS-CoV-2 Specimen Source Nasopharyngeal     SARS CoV 2 Overall Result Negative    PT/APTT [[id number]]  (Abnormal) Collected: 10/04/19 0949     Updated: 10/04/19 1032     PT 13.8 sec      PT INR 1.2     PTT 39 sec     Troponin I [[id number]] Collected: 10/04/19 0949    Specimen: Blood Updated: 10/04/19 1029     Troponin I 0.01 ng/mL     Cell MorpHology [[id number]]  (Abnormal) Collected: 10/04/19 0949     Updated: 10/04/19 1025     Cell Morphology Abnormal     Platelet Estimate Normal     Ovalocytes Present     Spherocytes 1+    CBC and differential [[id number]]  (Abnormal) Collected: 10/04/19 0949     RBC 6.14 x10 6/uL      MCV 68.9 fL      MCH 20.7 pg      MCHC 30.0 g/dL      RDW 21 %             Diagnostics:  CT Head WO Contrast   Impression:       Essentially stable appearance to the hemorrhagic infarct involving the  right thalamus with intraventricular extension. No new hemorrhage or  hydrocephalus is seen. There is minimal, 1 mm, right to left midline  shift.    CT Head WO Contrast   Impression:     1.  Acute hemorrhagic infarct in the right thalamus with  intraventricular extension. No herniation or hydrocephalus.  2.  Chronic microvascular changes.     EKG:  Interpreted by the EP.  Time Interpreted:               Rate: 64              Rhythm: Normal Sinus Rhythm               Interpretation: st elevation in v2 /q wave                Medications in ED:    Medication Administration from 10/04/2019 0922 to 10/04/2019 1603   Date/Time Order Dose Route Action Action by Comments    10/04/2019 1600 niCARdipine (CARDENE) 25 mg in sodium chloride 0.9 % 100 mL infusion 5 mg/hr Intravenous Rate/Dose Verify Ladona Horns, RN     10/04/2019 1545 niCARdipine (CARDENE) 25 mg in sodium chloride 0.9 % 100 mL  infusion 5 mg/hr Intravenous Handoff/Dual RN Verify Macario Carls, RN     10/04/2019 1447 niCARdipine (CARDENE) 25 mg in sodium chloride 0.9 % 100 mL infusion 5 mg/hr Intravenous Rate/Dose Change Moffa, Tia R, RN     10/04/2019 1440 niCARdipine (CARDENE) 25 mg in sodium chloride 0.9 % 100 mL infusion 7.5 mg/hr Intravenous Rate/Dose Change Moffa, Tia R, RN     10/04/2019 1430 niCARdipine (CARDENE) 25 mg in sodium chloride 0.9 % 100 mL infusion 10 mg/hr Intravenous Rate/Dose Change Moffa, Tia R, RN     10/04/2019 1358 niCARdipine (CARDENE) 25 mg in sodium chloride 0.9 % 100 mL infusion 12.5 mg/hr Intravenous [address] Macario Carls, RN     10/04/2019 1337 niCARdipine (CARDENE) 25 mg in sodium chloride 0.9 % 100 mL infusion 12.5 mg/hr Intravenous Rate/Dose Change Macario Carls, RN     10/04/2019 1217 niCARdipine (CARDENE) 25 mg in sodium chloride 0.9 % 100 mL infusion 15 mg/hr Intravenous [address] Macario Carls, RN     10/04/2019 1118 niCARdipine (CARDENE) 25 mg in sodium chloride 0.9 % 100 mL infusion 15 mg/hr Intravenous Rate/Dose Change Macario Carls, RN     10/04/2019 1041 niCARdipine (CARDENE) 25 mg in sodium chloride 0.9 % 100 mL infusion 12.5 mg/hr Intravenous Rate/Dose Change Macario Carls, RN     10/04/2019 1032 niCARdipine (CARDENE) 25 mg in sodium chloride 0.9 % 100 mL infusion 10 mg/hr Intravenous Rate/Dose Change Macario Carls, RN     10/04/2019 1025 niCARdipine (CARDENE) 25 mg in sodium chloride 0.9 % 100 mL infusion 7.5 mg/hr Intravenous Rate/Dose Change Lobue, Jaclyn P, RN     10/04/2019 1012 niCARdipine (CARDENE) 25 mg in sodium chloride 0.9 % 100 mL infusion 5 mg/hr Intravenous [address] Macario Carls, RN     10/04/2019 1246 levETIRAcetam (KEPPRA) 750 mg in sodium chloride 0.9 % 100 mL IVPB 750 mg Intravenous [place] Moffa, Nancee Liter, RN          MD Notes:  Neurosurgery note 10/04/2019  Assessment:   64 y.o. male brought in by EMS after being found altered in his care.  On arrival to ED BP was  213/86. CT head revealed 2 cm x 1 cm right thalamic IPH with extension to right lateral ventricle. No MLS. ICH score= 1(IVH)  On physical examination, patient has left sided weakness and dysarthria.  GCS 15.  Plan:   I had an extensive discussion with the patient regarding the condition.  I discussed the CT head findings with patients girlfriend and son.    Summary of recommendations:  - Q 1hr neuro checks  - HOB > 30 degrees  - Keppra  BID   - SBP goal < 140 mm hg   - No bloodthinners / ASA / NSAIDs   - PT / OT / SLP  - No EVD planned for this time.  Will watch closely for development of hydrocephalus and extension of intraventricular hemorrhage    H&P note 10/04/2019  ICU Problem List: Right hemorrhagic thalamic infarction, encephalopathy, hypertensive urgency  Assessment:   Acute hemorrhagic infarction of right thalamus  .  Encephalopathy  .  Hypertensive urgency  Plan:   Seen by neurosurgery, no intervention needed at this point.  Start Keppra for seizure prophylaxis  .  Encephalopathy, secondary to above, continue to monitor neurological status  .  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  Encephalopathy  .  Hypertensive urgency  Plan:   Reviewed repeat CT of the head, no significant change, also followed by neurosurgery and neurology  .  Encephalopathy, still intermittently confused, monitor neurological status  .  On Cardene drip, weaning as tolerated      Orders  Neuro checks, VS, SCD's, PT/OT, O2 therapy,     Medications: Scheduled Meds:  Current Facility-Administered Medications   Medication Dose Route Frequency    levETIRAcetam  750 mg Intravenous Q12H [facility]    senna-docusate  1 tablet  Oral Q12H SCH     Continuous Infusions:   niCARdipine Stopped (10/05/19 1734)     PRN Meds:.acetaminophen

## 2019-10-05 NOTE — Progress Notes (Signed)
[facility] Consultation Note                                       Date Time: 10/05/19 11:49 AM  Patient Name: Michael White, Michael SR.  Requesting Physician: Lattie Haw, MD  Date of Admission: 10/04/2019    CC / Reason for Consultation: left-sided weakness           Assessment:     64y/o gentleman with hx of HTN presenting with sudden onset of left sided weakness and slurred speech. 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     Suspect hypertensive etiology of his ICH.       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    Borderline hypertension     on meds in past; BP normal without meds recently          Past Surgical Hx:     Past Surgical History:   Procedure Laterality Date    CARDIAC ABLATION  04/15/2013    ablation of slow pathway for AVNRT        Family Medical History:      Family History   Problem Relation Age of Onset    Heart disease Father         unknown what kind but not MI       Social Hx     Social History     Socioeconomic History    Marital status: Legally Separated     Spouse name: Not on file    Number of children: Not on file    Years of education: Not on file    Highest education level: Not on file   Occupational History    Not on file   Tobacco Use    Smoking status: Former Smoker     Packs/day: 0.50     Years: 14.00     Pack years: 7.00     Types: Cigarettes     Quit date: 08/28/2012     Years since quitting: 7.1    Smokeless tobacco: Never Used   Substance and Sexual Activity    Alcohol use: Yes     Comment: occ beer some weekends    Drug use: No    Sexual  activity: Not on file   Other Topics Concern    Not on file   Social History Narrative    Not on file     Social Determinants of Health     Financial Resource Strain:     Difficulty of Paying Living Expenses:    Food Insecurity:     Worried About Programme researcher, broadcasting/film/video in the Last Year:     Barista in the Last Year:    Transportation Needs:     Lack of Transportation (Medical):     Lack of Transportation (Non-Medical):    Physical Activity:     Days of Exercise per  Week:     Minutes of Exercise per Session:    Stress:     Feeling of Stress :    Social Connections:     Frequency of Communication with Friends and Family:     Frequency of Social Gatherings with Friends and Family:     Attends Religious Services:     Active Member of Clubs or Organizations:     Attends Engineer, structural:     Marital Status:    Intimate Partner Violence:     Fear of Current or Ex-Partner:     Emotionally Abused:     Physically Abused:     Sexually Abused:        Meds     Home :   Prior to Admission medications    Medication Sig Start Date End Date Taking? Authorizing Provider   acetaminophen (TYLENOL) 325 MG tablet Take 2 tablets (650 mg total) by mouth every 4 (four) hours as needed for Pain 05/24/18   Johnsie Cancel, Stacy L, DO   cyclobenzaprine (FLEXERIL) 5 MG tablet Take 1 tablet (5 mg total) by mouth 2 (two) times daily as needed for Muscle spasms 05/24/18   Johnsie Cancel, Stacy L, DO   ibuprofen (ADVIL,MOTRIN) 800 MG tablet Take 1 tablet (800 mg total) by mouth every 8 (eight) hours as needed for Pain. 09/23/15   Donny Pique, MD      Inpatient :       Allergies    Patient has no known allergies.      Review of Systems     All other systems were reviewed and are negative except for that mentioned in the HPI    Physical Exam:   Temp:  [97.8 F (36.6 C)-98.7 F (37.1 C)] 97.8 F (36.6 C)  Heart Rate:  [58-116] 64  Resp Rate:  [11-35] 24  BP: (100-201)/(53-100) 110/71       General: in no acute  distress. Cooperative with the exam  Neck: supple  CVS: warm and well perfused  Resp: no respiratory distress  Extremities: no pedal edema, no rashes noted    Neurological Examination:  MSE: A&Ox3, speech fluent without word-finding difficulty or paraphasic errors, simple naming, reading and repetition intact  CN: Pupils 4-->5mm b/l, no gaze deviation, EOMI,  reports sensation in V1-V3 intact to LT/temp, smile symmetric, mild dysarthria.  Motor: 5/5 strength in RUE and RLE. LUE proximal 4+/5, distal 4/5. LLE proximal and distal 4/5.  Sensory: Intact and symmetric to LT x4 extremities.        Labs:     Results     Procedure Component Value Units Date/Time    Hemoglobin A1C [[id number]] Collected: 10/05/19 0313    Specimen: Blood Updated: 10/05/19 0722     Hemoglobin A1C 5.1 %      Average Estimated Glucose 99.7 mg/dL     Narrative:      This is NOT the correct Test for Patients with  Hemoglobinopathy.    Lipid panel [[id number]] Collected: 10/05/19 0313    Specimen: Blood Updated: 10/05/19 0718     Cholesterol 169 mg/dL      Triglycerides 78 mg/dL      HDL 59 mg/dL      LDL Calculated 94 mg/dL      VLDL Calculated 16 mg/dL      Cholesterol / HDL Ratio 2.9    Narrative:      This is NOT the correct Test for Patients with  Hemoglobinopathy.  Hemolysis index [[id number]]  (Abnormal) Collected: 10/05/19 0313     Updated: 10/05/19 0718     Hemolysis Index 57    Narrative:      This is NOT the correct Test for Patients with  Hemoglobinopathy.    Magnesium [[id number]] Collected: 10/05/19 0313    Specimen: Blood Updated: 10/05/19 0522     Magnesium 1.6 mg/dL     Phosphorus [[id number]] Collected: 10/05/19 0313    Specimen: Blood Updated: 10/05/19 0522     Phosphorus 3.8 mg/dL     Hemolysis index [[id number]]  (Abnormal) Collected: 10/05/19 0313     Updated: 10/05/19 0522     Hemolysis Index 27    PT/APTT [[id number]]  (Abnormal) Collected: 10/05/19 0313     Updated: 10/05/19 0418     PT 14.2 sec      PT INR 1.2     PTT 31 sec      Comprehensive metabolic panel [[id number]]  (Abnormal) Collected: 10/05/19 0313    Specimen: Blood Updated: 10/05/19 0414     Glucose 95 mg/dL      BUN 9.0 mg/dL      Creatinine 0.9 mg/dL      Sodium 188 mEq/L      Potassium 3.8 mEq/L      Chloride 105 mEq/L      CO2 23 mEq/L      Calcium 9.3 mg/dL      Protein, Total 7.1 g/dL      Albumin 3.9 g/dL      AST (SGOT) 24 U/L      ALT 18 U/L      Alkaline Phosphatase 95 U/L      Bilirubin, Total 1.7 mg/dL      Globulin 3.2 g/dL      Albumin/Globulin Ratio 1.2     Anion Gap 9.0    Hemolysis index [[id number]]  (Abnormal) Collected: 10/05/19 0313     Updated: 10/05/19 0414     Hemolysis Index 25    GFR [[id number]] Collected: 10/05/19 0313     Updated: 10/05/19 0414     EGFR >60.0    CBC and differential [[id number]]  (Abnormal) Collected: 10/05/19 0313    Specimen: Blood Updated: 10/05/19 0344     WBC 6.74 x10 3/uL      Hgb 12.7 g/dL      Hematocrit 27.0 %      Platelets 163 x10 3/uL      RBC 6.03 x10 6/uL      MCV 68.0 fL      MCH 21.1 pg      MCHC 31.0 g/dL      RDW 21 %      MPV 10.0 fL      Neutrophils 58.5 %      Lymphocytes Automated 30.7 %      Monocytes 9.1 %      Eosinophils Automated 1.0 %      Basophils Automated 0.4 %      Immature Granulocytes 0.3 %      Nucleated RBC 0.0 /100 WBC      Neutrophils Absolute 3.94 x10 3/uL      Lymphocytes Absolute Automated 2.07 x10 3/uL      Monocytes Absolute Automated 0.61 x10 3/uL      Eosinophils Absolute Automated 0.07 x10 3/uL      Basophils Absolute Automated 0.03 x10 3/uL      Immature Granulocytes Absolute 0.02 x10 3/uL      Absolute NRBC 0.00 x10 3/uL  Rads:     Results for orders placed or performed during the hospital encounter of 09/23/15   CT Head WO Contrast    Narrative    History: Trauma.    Comparisons: 04/14/2013.    Technique: Axial CT brain obtained from vertex to skull base without  contrast. Axial CT facial bones with coronal and sagittal  reconstructions.    A combination of automatic exposure  control, adjustment of the mA and/or  kV according to patient size and/or use of iterative reconstruction  technique was utilized.    Findings:  Gray-white matter differentiation preserved.        Basal ganglia, thalami, midbrain, pons unremarkable.    No mass, mass effect, or midline shift. No intra-axial or extra-axial  hemorrhage or collection.    Ventricles, sulci, cisterns age-appropriate in size and configuration.    Calvarium appears intact.         No maxillofacial bone fractures or bony dehiscence.     Orbits and globes intact.     Frontal sinuses, sphenoid sinuses, maxillary sinuses, and ethmoid air  cells clear. Mastoid air cells, middle ear cavities clear.    Soft tissue unremarkable.      Impression       No acute intracranial or maxillofacial abnormality.    Adaline Sill, MD   09/23/2015 11:04 PM             Elspeth Cho  [facility]  Spectralink [phone]

## 2019-10-05 NOTE — Treatment Plan (Signed)
FOUR EYES SKIN ASSESSMENT NOTE    Michael POCHE Sr.  02/10/1956  [id number]    Braden Scale Score: 14    POC Initiated for Risk for Altered Skin No      Patient Assessed for Correct Mattress Surface Yes  *At risk patients with Braden Score less than 12 must be considered for specialty bed    Mepilex or Adhesive Foam Dressing applied to sacrum/heel if any PI risk factors present: Yes    If Wound/Pressure Injury present:    Wound/PI assessment documented in EHR: No      Admitting physician notified: No      Wound consult ordered: No      Karleen Dolphin, RN  Oct 05, 2019  7:07 AM    Second RN/PCT Name:

## 2019-10-05 NOTE — Discharge Instructions (Signed)
Stroke (Completed)    You have had a stroke, or cerebrovascular accident (CVA). This is caused by a loss of blood flow to part of your brain. Ischemic type strokes can occur when a blood clot forms inside the carotid artery (main artery from the heart to the brain) or inside the heart and travels to the brain to lodge in a blood vessel and block blood flow. Another common ischemic cause of stroke is a gradual narrowing of the arteries in the brain because of buildup of fatty deposits (plaque). A clot can form at such a narrowing. Less commonly, infection or cancer can cause ischemic stroke. An autoimmune disease can cause inflammation of the blood vessels and also lead to stroke. A second type of stroke is hemorrhagic stroke. It occurs when a blood vessel ruptures and stops the flow of blood to part of the brain.  Symptoms  Blocked blood flow in different areas of the brain can cause different symptoms. If you have had a stroke before, a new one may be different. A memory aid for the basic signs of a stroke is F.A.S.T.  F.A.S.T.   F: Face drooping, or numbness on one side. This may be more noticeable when you ask the affected person to smile.   A: Arm weakness or numbness. The affected person may have trouble using or lifting one side.   S: Speech difficulty. Speech may be slurred or hard to understand. The affected person may also use the wrong words.   T: Time to call 911. Time is critical in treating a stroke. Call 911 as soon as you suspect a stroke has happened--even a small one. The sooner treatment is started the better, even if the symptoms go away.  Other common symptoms of a stroke include:   Having trouble getting the right words to come out   Weakness in one leg   Numbness on one side   Trouble walking   Trouble with coordination   Trouble with vision   Severe headache   Confusion   Dizziness  Treatment  After you have had a stroke, you are at risk of having another. Be sure to follow up  with your healthcare provider for more assessment and treatment. If problems are found, your healthcare provider will advise treatment with medicines, procedures, or both.  To reduce your chance of having another stroke, you may be prescribed medicines. These include medicines to prevent blood clots, such as antiplatelet or anticoagulant medicines. Certain heart conditions can be treated with surgery to reduce the risk for more strokes. Surgery to open up a blockage in the carotid artery (endarterectomy) may also reduce the risk for future strokes.  You will likely need physical therapy, including speech and occupational therapy if appropriate. This can be done at home, at an outpatient office, or in a rehabilitation hospital. Which location depends on your needs and abilities.  Home care   Rest at home and don't exert yourself for the next few days.   If your healthcare provider has prescribed medicines, take them as directed.    Follow-up care  Follow up with your healthcare provider, or as advised. Additional tests may be needed. If you had an X-ray, CT scan, MRI, or ECG (electrocardiogram), it will be reviewed by a specialist. You will be notified of any new findings that will affect your care.  Call 911  Call 911 if any of these occur:   Any of your stroke symptoms worsen   Sudden new   problems with speech, confusion, vision, walking, coordination, facial droop, or weakness or numbness on one side of your body   Severe headache, fainting spell, dizziness, or seizure   Chest pain or shortness of breath  Remember F.A.S.T. (described above). If you notice warning signs and symptoms of stroke, Call 911 right away.  Never drive yourself or the victim. The ambulance can alert the hospital and start treatment.    [employer] last reviewed this educational content on 08/02/2018   2000-2020 [employer] [address], [place] [address]. All rights reserved. This information is not intended as a  substitute for professional medical care. Always follow your healthcare professional's instructions.

## 2019-10-05 NOTE — Treatment Plan (Cosign Needed)
Pt Ox4 overnight, yet drowsy. Will wake up and seem confused but answers all questions correctly and is easily redirected to not pull at IV lines or cardiac monitor cables. Moves all extremities and follows commands when cooperating. Still on cardene drip for SBP <140.   Activity   []  Y / [x]  N  PMP Activity: Step 1 - Bedrest (10/05/2019  6:00 AM)      Shift target level:    PT/OT Consult: [x]  Y / []  N    Feeding/Fluids     []  Y    /   [x]  N  Diet NPO effective now     Intake/Output Summary (Last 24 hours) at 10/05/2019 0702  Last data filed at 10/05/2019 0600  Gross per 24 hour   Intake 589.59 ml   Output 500 ml   Net 89.59 ml              Analgesia  Not use for sedation     []  Y    /    []  N    /   [x]  NA    Pain/CPOT Score:   Pain Score: 0-No pain (Pain)    (CPOT)    Sedation/  Paralytic?   []  Y    /    []  N     /   []  NA  []  Y    /    []  N     /   [x]  NA    Target RASS Score:  Patient Actual RASS Score:   RASS Score: Drowsy    CAM-ICU: Delirium Present? []  Y    /    []  N  IF CAM Positive - use the acronym THINK  Positive or Negative for Delirium?: Positive  SAT Performed: []  Y    /    []  N  /   []  NA        Thromboprophylaxis   []  Y    /    [x]  N    Last Anticoagulant Admin          No anticoagulants administered              HAPI Prevention     []  Y    /   [x]   N    Braden score:   Braden Scale Score: 14      Ulcer Prevention []  Y    /  [x]  N  If indicated    Glucose Control     []  Y    /  [x]   N  Recent Labs     10/05/19  0313 10/04/19  0949 10/04/19  0936   Glucose 95 92  --    Whole Blood Glucose POCT  --   --  84         Spontaneous     Breathing Trial []  Y    /   []  N    /   [x]  NA       Respiratory Status O2 Device:   O2 Device: None (Room air)  Current Settings:     If COVID +, patient able to self-prone: []  Y /  []  N    Incentive Spirometer Volume:        Bowel Care   []  Y    /   [x]  N    Indwelling Catheter    Removal  Foley?  Central Line? []  Y    /  []  N     /   [  x] NA                 Vasopressors?      De-escalation of      Antibiotics   []  Y    /   []  N     /   [x]  NA    Miscellaneous Restraints []  Y / []  N  Palliative Care []  Y / []  N  Transfer or Discharge: []  Y / []  N  WRTC Trigger: []  Y / []  N  Family Conference: []  Y / []  N      Core Measures AMI:  [] ASA [] Beta-Blocker  CHF: [] EF% [] ACE or ARB  Stroke: [] PT/OT [] Swallow Screen   PNA: [] Immunization [] Bld culture w/in 24 hrs        Goals for today    For Pt/Ot and slp today. Keep sbp <140.      Participants Physician: [x]  Y / []  N  Nursing:[x]  Y / []  N  Pharmacy: [x]  Y / []  N  Nutrition: [x]  Y / []  N  Case Management: []  Y / []  N  Respiratory: []  Y / []  N  Ethics:[x]  Y / []  N  PT/OT: []  Y / []  N

## 2019-10-05 NOTE — Plan of Care (Signed)
PT/OT/ and SLP came and evaluated pt today during shift. Pt advised to use walker and one person assist. Pt transferred to chair and stayed up until 1600. Son came and visited pt. Pt seemed more drowsy and unable to answer nurse when nurse examined pt at 1700. Dr. Eston Mould came and assessed pt. Pt A&Ox4. Nicardipine drip stopped at 1734.     Problem: Safety  Goal: Patient will be free from infection during hospitalization  Outcome: Progressing  Flowsheets (Taken 10/05/2019 1934)  Free from Infection during hospitalization:   Assess and monitor for signs and symptoms of infection   Monitor lab/diagnostic results   Monitor all insertion sites (i.e. indwelling lines, tubes, urinary catheters, and drains)   Encourage patient and family to use good hand hygiene technique     Problem: Pain  Goal: Pain at adequate level as identified by patient  Outcome: Progressing  Flowsheets (Taken 10/04/2019 1635 by Ladona Horns, RN)  Pain at adequate level as identified by patient:   Identify patient comfort function goal   Assess for risk of opioid induced respiratory depression, including snoring/sleep apnea. Alert healthcare team of risk factors identified.   Assess pain on admission, during daily assessment and/or before any "as needed" intervention(s)   Reassess pain within 30-60 minutes of any procedure/intervention, per Pain Assessment, Intervention, Reassessment (AIR) Cycle   Evaluate if patient comfort function goal is met   Evaluate patient's satisfaction with pain management progress   Offer non-pharmacological pain management interventions     Problem: Every Day - Stroke  Goal: Neurological status is stable or improving  Outcome: Progressing  Flowsheets (Taken 10/04/2019 1635 by Ladona Horns, RN)  Neurological status is stable or improving:   Monitor/assess/document neurological assessment (Stroke: every 4 hours)   Monitor/assess NIH Stroke Scale   Observe for seizure activity and initiate seizure precautions if indicated    Re-assess NIH Stroke Scale for any change in status   Perform CAM Assessment  Goal: Stable vital signs and fluid balance  Outcome: Progressing  Flowsheets (Taken 10/04/2019 1635 by Ladona Horns, RN)  Stable vital signs and fluid balance:   Position patient for maximum circulation/cardiac output   Monitor and assess vitals every 4 hours or as ordered and hemodynamic parameters   Monitor intake and output. Notify LIP if urine output is < 30 mL/hour.   Encourage oral fluid intake   Apply telemetry monitor as ordered  Goal: Will be able to express needs and understand communication  Outcome: Progressing  Flowsheets (Taken 10/05/2019 1934)  Able to express needs and understand communication:   Provide alternative method of communication if needed   Consult/collaborate with Speech Language Pathology (SLP)   Include patient care companion in decisions related to communication   Patient/patient care companion demonstrates understanding on disease process, treatment plan, medications and discharge plan

## 2019-10-05 NOTE — Plan of Care (Signed)
Goals:   Pt will follow multi step commands with min cues 80% of given opportunities.  Patient will recall 2/3 unrelated items with max cues after 3 minute delay  Patient will name 10 items to given category in 60 seconds with mod cues        Goals:  Patient will adhere to swallow safety precautions 100% of the time during meals.  Pt will demonstrate adequate oral phase in order to safely manage mechanical soft and thin liquids for 3 meals /day without overt s/s aspiration and use of min cues for comp swallowing strategies.   Pt will use strategies to improve swallow function to tolerate the least restrictive diet without s/s aspiration or penetration provided with supervision.        Plan/Recommendations:  Follow up treatments: diet monitoring, strategies training  Diet Solids Recommendation: mechanical soft  Diet Liquids Recommendations: thin consistency  Precautions/Compensations: Awake/alert, Upright 90 degrees for all oral intake, 45 degrees upright after meals, Alternate solids and liquids, Swallow multiple times per bite/sip  Recommendation Discussed With: : Patient, Nurse  SLP Frequency Recommended: follow-up visit only  Administration of Medications: PO  D/w RN diet texture recommendation, RN authorized Clinical research associate to place a diet order in [employer]

## 2019-10-05 NOTE — PT Eval Note (Signed)
[facility]  Physical Therapy Evaluation and Treatment    Patient: Michael HIDROGO Sr.  MRN#: [id number]  Unit: [facility]  CARDIOVASCULAR & NEURO INTENSIVE CARE  Bed: A2716/A2716-01    Time of Evaluation and Treatment:  Time Calculation  PT Received On: 10/05/19  Start Time: 0909  Stop Time: 0936  Time Calculation (min): 27 min    Evaluation Time: 10 minutes  Treatment Time: 17 minutes    Chart Review and Collaboration with Care Team: 6 minutes, not included in above time    PT Visit Number: 1    Consult received for Michael Haggard Sr. for PT Evaluation and Treatment.  Patients medical condition is appropriate for Physical therapy intervention at this time.    Activity Orders:  PT eval and treat    Precautions and Contraindications:  Precautions  Weight Bearing Status: no restrictions  Other Precautions: Falls, HOB >30 degs, SBP < 140    Personal Protective Equipment (PPE)  gloves, procedure mask with face shield and shoe covers    Medical Diagnosis:  Acute cerebral hemorrhage [I61.9]  Hypertensive emergency [I16.1]  Acute CVA (cerebrovascular accident) [I63.9]    History of Present Illness:  Michael Shenker. is a 64 y.o. male admitted on 10/04/2019 brought in by EMS after being found altered in his care.  On arrival to ED BP was 213/86. CT head revealed 2 cm x 1 cm right thalamic IPH with extension to right lateral ventricle. No MLS. ICH score= 1(IVH)  On physical examination, patient has left sided weakness and dysarthria.  GCS 15.         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    Borderline hypertension     on meds in past; BP normal without meds recently     Past Surgical History:   Procedure Laterality Date    CARDIAC ABLATION  04/15/2013    ablation of slow pathway for AVNRT       X-Rays/Tests/Labs:  Lab Results   Component Value Date/Time    HGB 12.7 10/05/2019 03:13 AM    HCT 41.0 10/05/2019 03:13 AM    K 3.8 10/05/2019 03:13 AM    NA 137 10/05/2019 03:13 AM    INR 1.2 (H) 10/05/2019 03:13 AM    TROPI 0.01 10/04/2019 09:49 AM    TROPI 0.01 04/14/2013 11:21 PM       All imaging reviewed, please see chart for details.    Social History:  Prior Level of Function  Prior level of function: Independent with ADLs, Ambulates independently  Baseline Activity Level: Community ambulation  Driving: independent  Employment: FT    Home Living Arrangements  Living Arrangements: Spouse/significant other, Children  Type of Home: House  Home Layout: Multi-level, Stairs to enter with rails (add number in comment)(3 STE and 10 up to bedroom)  Bathroom Shower/Tub: Medical sales representative: Standard  Bathroom Accessibility: Accessible, Accessible via walker  Home Living - Notes / Comments: Pt reports his daughter  will be able to assist him as needed. He notes his wife has dementia and requires caregiver assistance.       Subjective:  Patient is agreeable to participation in the therapy session. Nursing clears patient for therapy.  Patient Goal: to go home  Pain Assessment  Pain Assessment: No/denies pain      Objective:  Observation of Patient/Vital Signs:    Vitals Check#1 (supine at rest) BP:110/71  HR:74  O2:100%     Vitals Check#2 (seated) BP:102/62  HR:87  O2:96%     Vitals Check#3 (standing x 2 min) BP:106/65 HR:82  O2:98%           Inspection/Posture: Pt received supine in bed    Cognitive Status and Neuro Exam:  Cognition/Neuro Status  Arousal/Alertness: Appropriate responses to stimuli  Attention Span: Appears intact  Orientation Level: Oriented X4  Memory: Appears  intact  Following Commands: Follows all commands and directions without difficulty  Safety Awareness: minimal verbal instruction  Insights: Fully aware of deficits;Educated in safety awareness  Problem Solving: minimal assistance  Behavior: attentive;calm;cooperative  Motor Planning: decreased initiation  Coordination: intact    Musculoskeletal Examination  Gross ROM  Neck/Trunk ROM: within functional limits  Right Upper Extremity ROM: within functional limits  Left Upper Extremity ROM: within functional limits  Right Lower Extremity ROM: within functional limits  Left Lower Extremity ROM: within functional limits  Gross Strength  Right Upper Extremity Strength: 4-/5  Left Upper Extremity Strength: 4-/5  Right Lower Extremity Strength: 4/5  Left Lower Extremity Strength: 4/5       Functional Mobility:  Functional Mobility  Supine to Sit: Minimal Assist;Increased Time;Increased Effort;using bedrail;HOB raised;to Right(for trunk assist )  Scooting to EOB: Stand by Assist;Increased Time;Increased Effort;using bedrail;to Right  Sit to Stand: Contact Guard Assist;Increased Time;Increased Effort;using bedrail;bed elevated;with instruction for hand placement to increase safety  Stand to Sit: Contact Guard Assist  Transfers  Bed to Chair: Cabin crew Used for Functional Transfer: front-wheeled walker  Locomotion  Ambulation: Advertising account executive Assist;with front-wheeled walker(70ft to bedside chair)  Pattern: Narrow BOS;Step to;decreased step length;decreased cadence     Balance  Balance  Sitting - Static: Good  Sitting - Dynamic: Good  Standing - Static: Good(w/ RW)  Standing - Dynamic: (w/ RW)    Participation and Activity Tolerance  Participation and Endurance  Participation Effort: good  Endurance: Tolerates < 10 min exercise, no significant change in vital signs  [place] Dyspnea Scale: 0 Dyspnea    Educated the patient to role of physical therapy, plan of care, goals of therapy and safety with  mobility and ADLs, energy conservation techniques, home safety; patient verbalized understanding.    Patient left in bedside chair with alarm and all other medical equipment in place and call bell and all personal items/needs within reach.  RN notified of session outcome.      Assessment:  Michael Piatkowski. is a 64 y.o. male admitted 10/04/2019. Pt would benefit from Physical Therapy to address deficits and increase functional independence.     PT Assessment  Assessment: Gait impairment;Decreased balance;Decreased functional mobility;Decreased endurance/activity tolerance;Decreased LE strength;Decreased UE strength;Decreased safety/judgement during functional mobility;Impaired motor control  Prognosis: Good;With continued PT status post acute discharge;Ongoing PT assessment needed  Progress: Slow progress, decreased activity tolerance      Treatment:  Patient engaged in bed mobility and was able to safely transfer to EOB with facilitation provided for safety/technique. Performed ambulation as noted above with no  LOB noted. Pt required increased verbal facilitation for upright posture and gait mechanics to maximize safety. Pt was limited by increased fatigue however was able to safely transfer into bedside chair. Pt was noted to require increased time to advance LLE. All OOB activities and ambulation performed with use of gait belt for increased pt and therapist safety. Vitals were assessed throughout session and remained WNL.  Reviewed PT plan of care and D/C recommendation. Reinforced  use of nursing assistance for safety and use of call bell. Pt verbalized understanding and agreement with plan.  Addressed all pt questions and concerns.      Plan:  Treatment/Interventions: Continued evaluation, Compensatory technique education, Bed mobility, Equipment eval/education, Patient/family training, Endurance training, LE strengthening/ROM, Functional transfer training, Neuromuscular re-education, Stair training, Gait  training, Exercise  PT Frequency: 1-2x/wk  Risks/Benefits/POC Discussed with Pt/Family: With patient    PMP Activity: Step 6 - Walks in Room  Distance Walked (ft) (Step 6,7): 5 Feet      Goals:  Goals  Goal Formulation: With patient  Time for Goal Acheivement: By time of discharge  Goals: Select goal  Pt Will Go Supine To Sit: modified independent, to maximize functional mobility and independence  Pt Will Perform Sit To Supine: modified independent, to maximize functional mobility and independence  Pt Will Perform Sit to Stand: modified independent, to maximize functional mobility and independence(using RW or LRAD)  Pt Will Transfer Bed/Chair: with rolling walker, modified independent, to maximize functional mobility and independence, Partly met(or using LRAD)  Pt Will Ambulate: 51-100 feet, with rolling walker, modified independent, to maximize functional mobility and independence, Partly met(or using LRAD)  Pt Will Go Up / Down Stairs: 1 flight, with supervision, With rail, to maximize functional mobility and independence         DME Recommended for Discharge: Single point cane, Front wheel walker  Discharge Recommendation: Home with supervision, Home with home health PT    Carney Living, [place], DPT  Physical Therapist  Physical Medicine & Rehabilitation  [phone]  Mon-Thur 7-5:30pm  10/05/2019 10:13 AM

## 2019-10-05 NOTE — Plan of Care (Signed)
Problem: Moderate/High Fall Risk Score >5  Goal: Patient will remain free of falls  Outcome: Progressing     Problem: Every Day - Stroke  Goal: Core/Quality measure requirements - Daily  Outcome: Progressing  Goal: Neurological status is stable or improving  Outcome: Progressing  Goal: Stable vital signs and fluid balance  Outcome: Progressing  Goal: Patient will maintain adequate oxygenation  Outcome: Progressing  Goal: Patient's risk of aspiration will be minimized  Outcome: Progressing  Goal: Nutritional intake is adequate  Outcome: Progressing  Goal: Elimination patterns are normal or improving  Outcome: Progressing  Goal: Mobility/Activity is maintained at optimal level for patient  Outcome: Progressing  Goal: Neurovascular status is stable or improving  Flowsheets (Taken 10/05/2019 0709)  Neurovascular status is stable or improving:   Monitor/assess neurovascular status (pulses, capillary refill, pain, paresthesia, presence of edema)   Monitor/assess for signs of Venous Thrombus Emboli (edema of calf/thigh redness, pain)  Goal: Effective coping demonstrated  Outcome: Progressing  Goal: Will be able to express needs and understand communication  Outcome: Progressing     Problem: Every Day - Stroke  Goal: Effective coping demonstrated  Outcome: Progressing     Problem: Every Day - Stroke  Goal: Will be able to express needs and understand communication  Outcome: Progressing

## 2019-10-05 NOTE — Progress Notes (Signed)
NEUROSURGERY PROGRESS NOTE    Date Time: 10/05/19 10:29 AM  Patient Name: Michael White, Michael White.  Consulting Attending Physician: Dr. Jonathon Jordan  Covered By:  Neurosurgery Coverage:  Monday - Saturday (0700-1900) - [employer] [phone] ([place]) or Pager [phone]  After 1900 and on Sundays/Holidays - Pager [phone]     *for medical concerns/orders please call/page primary medicine team*      Assessment:   64 y.o. male brought in by EMS after being found altered in his care.  On arrival to ED BP was 213/86. CT head revealed 2 cm x 1 cm right thalamic IPH with extension to right lateral ventricle. No MLS. ICH score= 1(IVH)  On physical examination, patient has left sided weakness and dysarthria.  GCS 15.  rHCT 5/3 stable     Plan:     - Q 1hr neuro checks  - HOB > 30 degrees  - Keppra BID   - SBP goal < 140 mm hg   - No bloodthinners / ASA / NSAIDs   - PT / OT / SLP  - No EVD planned for this time.  Will watch closely for development of hydrocephalus and extension of intraventricular hemorrhage       Interim History:     Sleepy this am. Opens eyes easily with verbal stim. States he feels much better today. Denies HA.     Medications:     Current Facility-Administered Medications   Medication Dose Route Frequency    levETIRAcetam  750 mg Intravenous Q12H [facility]    senna-docusate  1 tablet Oral Q12H [facility]         Physical Exam:     Vitals:    10/05/19 0900   BP: 110/71   Pulse: 64   Resp: (!) 24   Temp:    SpO2: 99%       Intake and Output Summary (Last 24 hours) at Date Time    Intake/Output Summary (Last 24 hours) at 10/05/2019 1029  Last data filed at 10/05/2019 0700  Gross per 24 hour   Intake 589.59 ml   Output 1000 ml   Net -410.41 ml       Neuro exam:   Opens eyes spontaneously,  oriented to self, not answering further orientation questions   Dysarthric   Attention span short, closes eyes immediately   PERRL, EOMI tracks across room   Facial sensation intact  Left facial droop   Hearing intact to conversation   Tongue midline  Shoulder  shrug weak on left   Motor:              Arms:       Deltoid  Bicep Tricep Grip   Right 5 5 5 5    Left  4 3 3 4                   Legs:       HF KE KF DF PF EHL   Right 5 5 5 5 5 5    Left  4 4 4 4 4 4       Mild left pronator drift   Light touch  intact        RUE: full strength   LUE: antigravity      RLE: full strength   BJY:NWGN gravity, drifts to midline      Gait not assessed due to clinical condition     GCS 15      Labs:     Lab Results  Component Value Date    WBC 6.74 10/05/2019    HGB 12.7 10/05/2019    HCT 41.0 10/05/2019    MCV 68.0 (L) 10/05/2019    PLT 163 10/05/2019     Lab Results   Component Value Date    NA 137 10/05/2019    K 3.8 10/05/2019    CL 105 10/05/2019    CO2 23 10/05/2019     Lab Results   Component Value Date    INR 1.2 (H) 10/05/2019    INR 1.2 (H) 10/04/2019    PT 14.2 (H) 10/05/2019    PT 13.8 (H) 10/04/2019       Rads:     Radiology Results (24 Hour)       Procedure Component Value Units Date/Time    CT Head WO Contrast [[id number]] Collected: 10/04/19 1934    Order Status: Completed Updated: 10/04/19 1956    Narrative:      Date: 10/04/2019 7:22 PM    HISTORY: Right thalamic hemorrhagic infarct     COMPARISON: Comparison study is CT head from 10/04/2019 at 1049 hours    PROCEDURE: Serial transaxial images are obtained at 5 mm intervals  through the calvarium. Sagittal and coronal reformatted images are  generated.  A combination of automatic exposure control, adjustment of the mA and/or  kV according to patient size and/or use of iterative reconstruction  technique was utilized.    FINDINGS: The right thalamic hemorrhagic infarct is again noted and when  measured at approximately the same level this measures 22 x 11 mm and is  stable in size. There is surrounding edema with a millimeter of  right-to-left midline shift. The hemorrhage again extends into the  posterior horn and body of the right lateral ventricle and appears  stable in volume.    Ventricles and sulci are otherwise  appropriate for age. No new  hydrocephalus. No new hemorrhage is seen. Benign basal ganglia  calcifications are again noted. The basilar cisterns appear intact. No  hyperdense vessels.    The visualized portions of the paranasal sinuses and mastoid air cells  are clear. No acute bony changes are identified.      Impression:         Essentially stable appearance to the hemorrhagic infarct involving the  right thalamus with intraventricular extension. No new hemorrhage or  hydrocephalus is seen. There is minimal, 1 mm, right to left midline  shift.    Ida Rogue, MD   10/04/2019 7:53 PM              Signed by: April M Cournoyer PA-C      NEUROSURGERY ATTENDING ADDENDUM    I met, interviewed and examined the patient today. I also reviewed the patient's medications, vitals, lab results and imaging studies myself. I agree with the above note.    Thank you for the opportunity of allowing me to participate in the care of Mr Michael WhiteHoyle Sauer White Dault, MD FAANS  Section Chief of Neurological Surgery    [facility]  Minimally Invasive and Complex Spine Surgery & General Neurosurgery  Department of Neurosciences   [facility] Neurosurgery  Assistant Professor of Neurosurgery   [facility]  Address: [address]   [place], [place] [address]  Phone [phone]   Fax [phone]

## 2019-10-05 NOTE — OT Eval Note (Signed)
[facility]  Occupational Therapy Evaluation and Treatment    Patient: Michael COOMBS Sr.  MRN#: [id number]   Unit: [facility] CARDIOVASCULAR & NEURO INTENSIVE CARE  Bed: A2716/A2716-01    Time of Evaluation and Treatment:  Time Calculation  OT Received On: 10/05/19  Start Time: 1202  Stop Time: 1242  Time Calculation (min): 40 min    Chart Review and Collaboration with Care Team: 18 minutes, not included in above time.    Evaluation: 17 minutes  Treatment: 23  minutes    OT Visit Number: 1    Consult received for Michael Haggard Sr. for OT Evaluation and Treatment.  Patients medical condition is appropriate for Occupational therapy intervention at this time.    Activity Orders:  progressive mobility protocol    Precautions and Contraindications:  Precautions  Weight Bearing Status: no restrictions  Aspiration Precautions: see SLP recommendations  Other Precautions: falls    Personal Protective Equipment (PPE)  gloves, procedure mask and eye shield/covering    Medical Diagnosis:  Acute cerebral hemorrhage [I61.9]  Hypertensive emergency [I16.1]  Acute CVA (cerebrovascular accident) [I63.9]    History of Present Illness:  Michael Tigges. is a 64 y.o. male admitted on 10/04/2019 with confusion & L sided weakness. Found by coworkers in truck who were unsure how long he had been there. 3 CTs of head revealed stable R thalamus hemorrhagic infarct.     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    Borderline hypertension     on meds in past; BP normal without  meds recently     Past Surgical History:   Procedure Laterality Date    CARDIAC ABLATION  04/15/2013    ablation of slow pathway for AVNRT        X-Rays/Tests/Labs  Lab Results   Component Value Date/Time    HGB 12.7 10/05/2019 03:13 AM    HCT 41.0 10/05/2019 03:13 AM    K 3.8 10/05/2019 03:13 AM    NA 137 10/05/2019 03:13 AM    INR 1.2 (H) 10/05/2019 03:13 AM    TROPI 0.01 10/04/2019 09:49 AM    TROPI 0.01 04/14/2013 11:21 PM       All imaging reviewed. Please see chart for details      Social History:  Prior Level of Function  Prior level of function: Independent with ADLs, Ambulates independently  Baseline Activity Level: Community ambulation  Driving: independent  Employment: FT    Home Living Arrangements  Living Arrangements: Spouse/significant other, Children  Type of Home: House  Home Layout: Multi-level, Stairs to enter with rails (add number in comment)(3 STE and 10 up to bedroom)  Bathroom Shower/Tub: Medical sales representative: Standard  Bathroom Accessibility: Accessible, Accessible via walker  Home Living - Notes / Comments: Pt reports his daughter will be able to assist him as needed. He notes his wife has dementia and requires caregiver assistance.       Subjective:  Patient is agreeable to participation in the therapy session. Nursing clears patient for therapy.   Patient Goal: Return to work  Pain Assessment  Pain Assessment: No/denies pain      Objective:   Inspection/Posture  Inspection/Posture: Pt received supine in bed  Observation of Patient/Vital Signs: BP 140/80    Pulse 81    Temp 98.4 F (36.9 C) (Oral)    Resp 21    Ht 1.803 m (5\' 11" )    Wt 58.6 kg (129 lb 3 oz)    SpO2 99%    BMI 18.02 kg/m     Cognitive Status and Neuro Exam:  Cognition/Neuro Status  Arousal/Alertness: Delayed responses to stimuli  Attention Span: Attends to task with redirection  Orientation Level: Oriented to place, Oriented to situation, Oriented to person, Disoriented to time  Memory: Decreased recall  of recent events  Following Commands: minimal verbal instruction  Safety Awareness: minimal verbal instruction  Behavior: anxious, attentive, calm  Motor Planning: decreased initiation, decreased processing speed, bradykinesia  Coordination: FMC impaired, GMC impaired  Hand Dominance: right handed    Musculoskeletal Examination  Gross ROM  Right Upper Extremity ROM: within functional limits  Left Upper Extremity ROM: within functional limits  Gross Strength  Right Upper Extremity Strength: 4-/5  Left Upper Extremity Strength: 4-/5       Sensory/Oculomotor Examination  Sensory  Visual Acuity: intact    Vision - Complex Assessment  Additional Comments: able to read large print.    Activities of Daily Living  Self-care and Home Management  Eating: Setup  LB Dressing: Don/doff R sock, Don/doff L sock(max A due to fatigue, decreased motor planning. Pt able to reach R toes, & L ankle but too fatigued and having difficulty initiating task due to decreased motor planning)  Toileting: Maximal Assist(condom catheter)  Functional Transfers: Minimal Assist, supervision/safety, increased time to complete, verbal prompting, steadying    Functional Mobility:  Mobility and Transfers  Sit to Stand: Minimal Assist, Increased Time, Increased Effort, with instruction for hand placement to increase safety  Functional Mobility/Ambulation: Contact Guard Assist(RW, difficulty shifting weight, L foot clearance and with upright posture w/ RW)     Balance  Balance  Static Sitting Balance: normal  Dyanamic Sitting Balance: good  Static Standing Balance: fair  Dynamic Standing Balance: poor(+)    Participation and Activity Tolerance  Participation and Endurance  Participation Effort: excellent  Endurance: Tolerates 30 min exercise with multiple rests    Educated the patient to role of occupational therapy, plan of care, goals of therapy and safety with mobility and ADLs with verbalized understanding.    Patient left in bedside chair with alarm  and all other medical equipment in place and call bell and all personal items/needs within reach.  RN notified of session outcome.      Assessment:  Michael Wilhelmsen. is a 64 y.o. male admitted 10/04/2019. Patient would benefit from continued skilled OT to address deficits listed below and increase functional independence.     OT Assessment: decreased strength      Treatment Activities:  Functional mobility, transfers, LB ADLs, please see details above.    Rehabilitation Potential: Prognosis: Good, With continued OT s/p acute discharge    Plan:  OT Frequency Recommended: 3-4x/wk   Treatment Interventions: ADL retraining, Functional transfer training, Endurance training, Patient/Family training, Equipment eval/education, Compensatory technique education     PMP Activity: Step 5 - Chair  Distance Walked (ft) (Step 6,7): 0 Feet  Risks/benefits/POC discussed    Goals:  Time For Goal Achievement: by time of discharge  ADL Goals  Patient will groom self: at sinkside, Risk analyst, Not met  Patient will dress upper body: Supervision, Not met  Patient will dress lower body: Minimal Assist, Partly met  Patient will toilet: Minimal Assist, Not met  Mobility and Transfer Goals  Pt will perform functional transfers: Contact Guard Assist, Partly met                             DME Recommended for Discharge: Front wheel walker, Wheelchair-manual  Discharge Recommendation: Acute Rehab      Dalia Heading, OTR/L, CNT, [place], [place] [id number]  1:11 PM 10/05/2019

## 2019-10-05 NOTE — Plan of Care (Signed)
Discharge Recommendation: Home with supervision, Home with home health PT  DME Recommended for Discharge: Single point cane, Front wheel walker    Is an Occupational Therapy Evaluation Indicated at this time? Yes, an OT evaluation is indicated at this time.    Treatment/Interventions: Continued evaluation, Compensatory technique education, Bed mobility, Equipment eval/education, Patient/family training, Endurance training, LE strengthening/ROM, Functional transfer training, Neuromuscular re-education, Stair training, Gait training, Exercise  PT Frequency: 1-2x/wk     PMP Activity: Step 6 - Walks in Room  Distance Walked (ft) (Step 6,7): 5 Feet  (Please See Therapy Evaluation for device and assistance level needed)    Goals:   Goals  Goal Formulation: With patient  Time for Goal Acheivement: By time of discharge  Goals: Select goal  Pt Will Go Supine To Sit: modified independent, to maximize functional mobility and independence  Pt Will Perform Sit To Supine: modified independent, to maximize functional mobility and independence  Pt Will Perform Sit to Stand: modified independent, to maximize functional mobility and independence(using RW or LRAD)  Pt Will Transfer Bed/Chair: with rolling walker, modified independent, to maximize functional mobility and independence, Partly met(or using LRAD)  Pt Will Ambulate: 51-100 feet, with rolling walker, modified independent, to maximize functional mobility and independence, Partly met(or using LRAD)  Pt Will Go Up / Down Stairs: 1 flight, with supervision, With rail, to maximize functional mobility and independence

## 2019-10-05 NOTE — OT Plan of Care Note (Signed)
Discharge Recommendation: Acute Rehab   DME Recommended for Discharge: Front wheel walker, Wheelchair-manual    OT Frequency Recommended: 3-4x/wk     Is PT evaluation indicated at this time? This patient is already on PT caseload.     PMP Activity: Step 5 - Chair  Distance Walked (ft) (Step 6,7): 0 Feet  (Please See Therapy Evaluation for device and assistance level needed)    Treatment Interventions: ADL retraining, Functional transfer training, Endurance training, Patient/Family training, Equipment eval/education, Compensatory technique education       Goal Formulation: Patient  Time For Goal Achievement: by time of discharge  ADL Goals  Patient will groom self: at sinkside, Risk analyst, Not met  Patient will dress upper body: Supervision, Not met  Patient will dress lower body: Minimal Assist, Partly met  Patient will toilet: Minimal Assist, Not met  Mobility and Transfer Goals  Pt will perform functional transfers: Contact Guard Assist, Partly met

## 2019-10-05 NOTE — Progress Notes (Signed)
ICU Daily Progress Note        Date Time: 10/05/19 9:12 AM  Patient Name: Michael White, Michael SR.  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  Acute right thalamic stroke  .  Encephalopathy  .  Hypertensive urgency    Plan:     .  Reviewed repeat CT of the head, no significant change, also followed by neurosurgery and neurology  .  Encephalopathy, still intermittently confused, monitor neurological status  .  On Cardene drip, weaning as tolerated    Discussed with patient  Discussed with bedside nurse    This patient has a high probability of sudden clinically significant deterioration which requires the highest level of physician preparedness to intervene urgently. I managed/supervised life or organ supporting interventions that required frequent physician assessments. I devoted my full attention in the ICU to the direct care of this patient for this period of time. Organ systems that require intensive critical care support include:respiratory     Code Status: full code    Subjective:     Patient is awake, following commands    Review of Systems:     A comprehensive review of systems was: General ROS: negative for - chills,  fever or night sweats  Ophthalmic ROS: negative for - double vision, excessive tearing, itchy eyes or photophobia  ENT ROS: negative for - headaches, nasal congestion, sore throat or vertigo  Respiratory ROS: negative for - cough, hemoptysis, orthopnea, pleuritic pain, shortness of breath, tachypnea or wheezing  Cardiovascular ROS: negative for - chest pain, edema, orthopnea, rapid heart rate  Gastrointestinal ROS: negative for - abdominal pain, blood in stools, constipation, diarrhea, heartburn or nausea/vomiting  Musculoskeletal ROS: negative for - muscle pain  Neurological ROS: Positive for intermittent  restlessness and confusion, negative for - dizziness, headaches, speech problems, visual changes or weakness  Dermatological ROS: negative for dry skin, pruritus and rash    Medications:      Scheduled Meds: PRN Meds:    levETIRAcetam, 750 mg, Intravenous, Q12H SCH  senna-docusate, 1 tablet, Oral, Q12H SCH        Continuous Infusions:   niCARdipine Stopped (10/05/19 0750)           Physical Exam:     General Appearance:  alert, seems comfortable   Neuro:  alert, seems oriented, however intermittently confused and restless, no  focal findings or movement disorder noted  Neck:supple, no significant adenopathy  Lungs: clear to auscultation, no wheezes, rales or rhonchi, symmetric air entry  Cardiac: normal rate, regular rhythm, normal S1, S2, no rubs, clicks or gallops  Abdomen:  soft, nontender, nondistended, no masses or organomegaly  Extremities: peripheral pulses normal, no pedal edema, no clubbing or cyanosis   Skin:   normal coloration and turgor, no rashes, no suspicious skin lesions noted  GU:    External urinary catheter    Data:     Invasive ICU Hemodynamics:    IBW/kg (Calculated) : 75.3    Patient Lines/Drains/Airways Status    Active PICC Line / CVC Line / PIV Line / Drain / Airway / Intraosseous Line / Epidural Line / ART Line / Line / Wound / Pressure Ulcer / NG/OG Tube     Name:   Placement date:   Placement time:  Site:   Days:    Peripheral IV 10/04/19 20 G Right Antecubital   10/04/19    1126    Antecubital   less than 1    Peripheral IV 10/04/19 18 G Left Upper Arm   10/04/19    1207    Upper Arm   less than 1    Peripheral IV 10/04/19 20 G Posterior;Right Hand   10/04/19    2300    Hand   less than 1    External Urinary Catheter   10/04/19    1630    --   less than 1                VITAL SIGNS   Temp:  [97.8 F (36.6 C)-99.2 F (37.3 C)] 97.8 F (36.6 C)  Heart Rate:  [58-116] 65  Resp Rate:  [10-35] 17  BP: (100-201)/(53-127) 135/65  Blood Glucose:  Pulse ox:  Telemetry:       Intake/Output  Summary (Last 24 hours) at 10/05/2019 0912  Last data filed at 10/05/2019 0700  Gross per 24 hour   Intake 589.59 ml   Output 1000 ml   Net -410.41 ml        Labs:     CBC w/Diff CMP   Recent Labs   Lab 10/05/19  0313 10/04/19  0949   WBC 6.74 7.91   Hgb 12.7 12.7   Hematocrit 41.0 42.3   Platelets 163 329   MCV 68.0* 68.9*   Neutrophils 58.5 61.8          PT/INR   Recent Labs   Lab 10/05/19  0313 10/04/19  0949   PT INR 1.2* 1.2*       Recent Labs   Lab 10/05/19  0313 10/04/19  0949   Sodium 137 137   Potassium 3.8 4.0   Chloride 105 104   CO2 23 23   BUN 9.0 11.0   Creatinine 0.9 1.0   Glucose 95 92   Calcium 9.3 9.4   Magnesium 1.6  --    Phosphorus 3.8  --    Protein, Total 7.1 7.6   Albumin 3.9 4.2   AST (SGOT) 24 21   ALT 18 20   Alkaline Phosphatase 95 100   Bilirubin, Total 1.7* 1.2     Recent Labs   Lab 10/04/19  0949   Troponin I 0.01       @lababg @   Glucose POCT   Recent Labs   Lab 10/05/19  0313 10/04/19  0949   Glucose 95 92        Rads:   Radiological Imaging personally reviewed.    I have personally reviewed the patients history and 24 hour interval events, along with vitals, labs, radiology images.     So far today I have spent 35 minutes providing care for this patient excluding teaching and billable procedures, and not overlapping with any other providers.      Lattie Haw, MD  Pulmonary and critical care  10/05/19

## 2019-10-06 ENCOUNTER — Inpatient Hospital Stay (HOSPITAL_COMMUNITY): Payer: Medicare Other

## 2019-10-06 DIAGNOSIS — Z87891 Personal history of nicotine dependence: Secondary | ICD-10-CM | POA: Diagnosis not present

## 2019-10-06 DIAGNOSIS — Z79891 Long term (current) use of opiate analgesic: Secondary | ICD-10-CM | POA: Diagnosis not present

## 2019-10-06 DIAGNOSIS — M545 Low back pain: Secondary | ICD-10-CM | POA: Diagnosis not present

## 2019-10-06 DIAGNOSIS — Z5181 Encounter for therapeutic drug level monitoring: Secondary | ICD-10-CM | POA: Diagnosis not present

## 2019-10-06 DIAGNOSIS — G8929 Other chronic pain: Secondary | ICD-10-CM | POA: Diagnosis not present

## 2019-10-06 DIAGNOSIS — I639 Cerebral infarction, unspecified: Secondary | ICD-10-CM

## 2019-10-06 DIAGNOSIS — I160 Hypertensive urgency: Secondary | ICD-10-CM

## 2019-10-06 LAB — COMPREHENSIVE METABOLIC PANEL
ALT: 16 U/L (ref 0–55)
AST (SGOT): 18 U/L (ref 5–34)
Albumin/Globulin Ratio: 1.2 (ref 0.9–2.2)
Albumin: 3.6 g/dL (ref 3.5–5.0)
Alkaline Phosphatase: 88 U/L (ref 38–106)
Anion Gap: 10 (ref 5.0–15.0)
BUN: 13 mg/dL (ref 9.0–28.0)
Bilirubin, Total: 1.2 mg/dL (ref 0.2–1.2)
CO2: 21 mEq/L — ABNORMAL LOW (ref 22–29)
Calcium: 8.8 mg/dL (ref 8.5–10.5)
Chloride: 103 mEq/L (ref 100–111)
Creatinine: 0.9 mg/dL (ref 0.7–1.3)
Globulin: 3 g/dL (ref 2.0–3.6)
Glucose: 94 mg/dL (ref 70–100)
Potassium: 4 mEq/L (ref 3.5–5.1)
Protein, Total: 6.6 g/dL (ref 6.0–8.3)
Sodium: 134 mEq/L — ABNORMAL LOW (ref 136–145)

## 2019-10-06 LAB — GFR: EGFR: >60

## 2019-10-06 LAB — CBC AND DIFFERENTIAL
Absolute NRBC: 0 10*3/uL (ref 0.00–0.00)
Basophils Absolute Automated: 0.02 10*3/uL (ref 0.00–0.08)
Basophils Automated: 0.3 %
Eosinophils Absolute Automated: 0.08 10*3/uL (ref 0.00–0.44)
Eosinophils Automated: 1 %
Hematocrit: 42.6 % (ref 37.6–49.6)
Hgb: 13.1 g/dL (ref 12.5–17.1)
Immature Granulocytes Absolute: 0.03 10*3/uL (ref 0.00–0.07)
Immature Granulocytes: 0.4 %
Lymphocytes Absolute Automated: 2.13 10*3/uL (ref 0.42–3.22)
Lymphocytes Automated: 26.9 %
MCH: 20.9 pg — ABNORMAL LOW (ref 25.1–33.5)
MCHC: 30.8 g/dL — ABNORMAL LOW (ref 31.5–35.8)
MCV: 68.1 fL — ABNORMAL LOW (ref 78.0–96.0)
MPV: 10.5 fL (ref 8.9–12.5)
Monocytes Absolute Automated: 0.75 10*3/uL (ref 0.21–0.85)
Monocytes: 9.5 %
Neutrophils Absolute: 4.91 10*3/uL (ref 1.10–6.33)
Neutrophils: 61.9 %
Nucleated RBC: 0 /100 WBC (ref 0.0–0.0)
Platelets: 192 10*3/uL (ref 142–346)
RBC: 6.26 10*6/uL — ABNORMAL HIGH (ref 4.20–5.90)
RDW: 22 % — ABNORMAL HIGH (ref 11–15)
WBC: 7.92 10*3/uL (ref 3.10–9.50)

## 2019-10-06 LAB — PHOSPHORUS: Phosphorus: 2.9 mg/dL (ref 2.3–4.7)

## 2019-10-06 LAB — PT AND APTT
PT INR: 1.2 — ABNORMAL HIGH (ref 0.9–1.1)
PT: 14.1 s — ABNORMAL HIGH (ref 10.1–12.9)
PTT: 26 s — ABNORMAL LOW (ref 27–39)

## 2019-10-06 LAB — MAGNESIUM: Magnesium: 1.7 mg/dL (ref 1.6–2.6)

## 2019-10-06 LAB — HEMOLYSIS INDEX: Hemolysis Index: 35 — ABNORMAL HIGH (ref 0–18)

## 2019-10-06 MED ORDER — HYDRALAZINE HCL 20 MG/ML IJ SOLN
20.00 mg | Freq: Once | INTRAMUSCULAR | Status: AC
Start: 2019-10-06 — End: 2019-10-06
  Administered 2019-10-06: 05:00:00 20 mg via INTRAVENOUS
  Filled 2019-10-06: qty 1

## 2019-10-06 MED ORDER — LEVETIRACETAM 500MG/5ML UNIT DOSE SYRINGE
750.00 mg | Freq: Two times a day (BID) | ORAL | Status: DC
Start: 2019-10-06 — End: 2019-10-08
  Administered 2019-10-06 – 2019-10-08 (×4): 750 mg via ORAL
  Filled 2019-10-06 (×6): qty 10

## 2019-10-06 MED ORDER — MELATONIN 3 MG PO TABS
3.0000 mg | ORAL_TABLET | Freq: Every evening | ORAL | Status: DC | PRN
Start: 2019-10-06 — End: 2019-10-08
  Administered 2019-10-06 – 2019-10-07 (×2): 3 mg via ORAL
  Filled 2019-10-06 (×2): qty 1

## 2019-10-06 MED ORDER — HYDRALAZINE HCL 50 MG PO TABS
50.0000 mg | ORAL_TABLET | Freq: Three times a day (TID) | ORAL | Status: DC
Start: 2019-10-06 — End: 2019-10-08
  Administered 2019-10-06 – 2019-10-08 (×7): 50 mg via ORAL
  Filled 2019-10-06 (×7): qty 1

## 2019-10-06 NOTE — Nursing Progress Note (Signed)
FOUR EYES SKIN ASSESSMENT NOTE    Michael SWAMY Sr.  03/29/1956  [id number]    Braden Scale Score: 19    POC Initiated for Risk for Altered Skin No      Patient Assessed for Correct Mattress Surface Yes  *At risk patients with Braden Score less than 12 must be considered for specialty bed    Mepilex or Adhesive Foam Dressing applied to sacrum/heel if any PI risk factors present: Yes    If Wound/Pressure Injury present:    Wound/PI assessment documented in EHR: No      Admitting physician notified: No      Wound consult ordered: No      Karleen Dolphin, RN  Oct 06, 2019  5:44 AM    Second RN/PCT Name:

## 2019-10-06 NOTE — Plan of Care (Signed)
Transferred from icu, Oriented to room, c/o "not being able to get comfortable", max inflate on bed mattress, ST low 100s on tele, room air, tolerating diet, NIHSS 1      Problem: Every Day - Stroke  Goal: Stable vital signs and fluid balance  Flowsheets (Taken 10/06/2019 1800)  Stable vital signs and fluid balance:   Position patient for maximum circulation/cardiac output   Monitor and assess vitals every 4 hours or as ordered and hemodynamic parameters   Apply telemetry monitor as ordered

## 2019-10-06 NOTE — Progress Notes (Signed)
Brief Hospital course:    [name] is a 64 year old gentleman who was admitted to [facility] no [facility] on April 3 of 2021 with change in mental status and was found to had right thalamic bleed. Patient was monitored in intensive care unit. He was on initially on Cardene drip for control of blood pressure. Over the last 24 hours patient has been stabilized. He is off Cardene drip. Neurosurgery has signed off has no surgical intervention needed. Patient is awake alert and oriented.    We will transfer to telemetry

## 2019-10-06 NOTE — Progress Notes (Signed)
ICU Daily Progress Note        Date Time: 10/06/19 8:29 AM  Patient Name: Michael White, Michael SR.  Attending Physician: Lattie Haw, MD  Room: [phone]   Admit Date: 10/04/2019  LOS: 2 days      ICU problem list: Right thalamic bleed, encephalopathy, hypertensive urgency    Assessment:     .  Right thalamic bleed  .  Hypertensive urgency  .  Encephalopathy    Plan:     .  Stable neurological status, patient is alert and oriented, continue to monitor neurological status  .  Off Cardene drip, continue oral antihypertensive medications  .  Improved metabolic encephalopathy, continue to monitor    Discussed with patient  Discussed with bedside nurse  Discussed with neurosurgery PA    This patient has a high probability of sudden clinically significant deterioration which requires the highest level of physician preparedness to intervene urgently. I managed/supervised life or organ supporting interventions that required frequent physician assessments. I devoted my full attention in the ICU to the direct care of this patient for this period of time. Organ systems that require intensive critical care support include:respiratory     Code Status: full code    Subjective:     Patient is sleeping but arousable, following commands, denies any headaches    Review of Systems:     A comprehensive review of systems was: General ROS: negative for - chills,  fever or night sweats  Ophthalmic ROS: negative for - double vision, excessive tearing, itchy eyes or photophobia  ENT ROS: negative for - headaches, nasal congestion, sore throat or vertigo  Respiratory ROS: negative for - cough, hemoptysis, orthopnea, pleuritic pain, shortness of breath, tachypnea or wheezing  Cardiovascular ROS: negative for - chest pain, edema, orthopnea, rapid heart rate  Gastrointestinal ROS: negative for - abdominal pain, blood in stools, constipation, diarrhea, heartburn or nausea/vomiting  Musculoskeletal ROS: negative for - muscle  pain  Neurological ROS: negative for - dizziness, headaches, speech problems, visual changes or weakness  Dermatological ROS: negative for dry skin, pruritus and rash    Medications:      Scheduled Meds: PRN Meds:    amLODIPine, 5 mg, Oral, Daily  hydrALAZINE, 50 mg, Oral, Q8H SCH  levETIRAcetam, 750 mg, Intravenous, Q12H SCH  senna-docusate, 1 tablet, Oral, Q12H SCH        Continuous Infusions:   niCARdipine Stopped (10/05/19 1734)    acetaminophen, 650 mg, Q6H PRN  hydrALAZINE, 25 mg, Q6H PRN          Physical Exam:     General Appearance:  alert, and in no distress  Neuro:  alert, oriented, normal speech, no focal findings or movement disorder noted  Neck:supple, no significant adenopathy  Lungs: clear to auscultation, no wheezes, rales or rhonchi, symmetric air entry  Cardiac: normal rate, regular rhythm, normal S1, S2, no rubs, clicks or gallops  Abdomen:  soft, nontender, nondistended, no masses or organomegaly  Extremities: peripheral pulses normal, no pedal edema, no clubbing or cyanosis   Skin:   normal coloration and turgor, no rashes, no suspicious skin lesions noted  GU:    External urinary catheter    Data:     Invasive ICU Hemodynamics:    IBW/kg (Calculated) : 75.3      Patient Lines/Drains/Airways Status    Active PICC Line / CVC Line / PIV Line / Drain / Airway / Intraosseous Line / Epidural Line / ART Line / Line / Wound /  Pressure Ulcer / NG/OG Tube     Name:   Placement date:   Placement time:   Site:   Days:    Peripheral IV 10/04/19 20 G Right Antecubital   10/04/19    1126    Antecubital   1    Peripheral IV 10/04/19 18 G Left Upper Arm   10/04/19    1207    Upper Arm   1    External Urinary Catheter   10/04/19    1630    --   1                VITAL SIGNS   Temp:  [98.4 F (36.9 C)-100 F (37.8 C)] 98.7 F (37.1 C)  Heart Rate:  [59-100] 93  Resp Rate:  [14-47] 20  BP: (110-182)/(57-104) 127/59  Blood Glucose:  Pulse ox:  Telemetry:       Intake/Output Summary (Last 24 hours) at 10/06/2019  0829  Last data filed at 10/06/2019 0500  Gross per 24 hour   Intake 1280 ml   Output 575 ml   Net 705 ml        Labs:     CBC w/Diff CMP   Recent Labs   Lab 10/06/19  0349 10/05/19  0313 10/04/19  0949   WBC 7.92 6.74 7.91   Hgb 13.1 12.7 12.7   Hematocrit 42.6 41.0 42.3   Platelets 192 163 329   MCV 68.1* 68.0* 68.9*   Neutrophils 61.9 58.5 61.8          PT/INR   Recent Labs   Lab 10/06/19  0349 10/05/19  0313 10/04/19  0949   PT INR 1.2* 1.2* 1.2*       Recent Labs   Lab 10/06/19  0349 10/05/19  0313 10/04/19  0949   Sodium 134* 137 137   Potassium 4.0 3.8 4.0   Chloride 103 105 104   CO2 21* 23 23   BUN 13.0 9.0 11.0   Creatinine 0.9 0.9 1.0   Glucose 94 95 92   Calcium 8.8 9.3 9.4   Magnesium 1.7 1.6  --    Phosphorus 2.9 3.8  --    Protein, Total 6.6 7.1 7.6   Albumin 3.6 3.9 4.2   AST (SGOT) 18 24 21    ALT 16 18 20    Alkaline Phosphatase 88 95 100   Bilirubin, Total 1.2 1.7* 1.2     Recent Labs   Lab 10/04/19  0949   Troponin I 0.01       @lababg @   Glucose POCT   Recent Labs   Lab 10/06/19  0349 10/05/19  0313 10/04/19  0949   Glucose 94 95 92        Rads:   Radiological Imaging personally reviewed.    I have personally reviewed the patients history and 24 hour interval events, along with vitals, labs, radiology images.     So far today I have spent 35 minutes providing care for this patient excluding teaching and billable procedures, and not overlapping with any other providers.      Lattie Haw, MD  Pulmonary and critical care  10/06/19

## 2019-10-06 NOTE — Malnutrition Assessment (Signed)
Michael Haggard Sr. is a 64 y.o. male patient.   [id number]      Malnutrition Documentation    Moderate Protein Energy Malnutrition related to suspected poor PO intake in the setting of chronic illness (hypertension) as evidenced by suspected PO intake meeting < 75% of nutrition needs for >/= 1 month, moderate muscle loss (temporalis, pectoralis, deltoid, interosseous, gastrocnemius, quadriceps) and moderate fat loss (orbital and buccal region, triceps).     Pt is at low threshold for severe malnutrition given questionable weight loss; will continue to monitor for 2 qualifying criteria.        Lenor Derrick, RDN  Clinical Dietitian  Ext. [phone]      If physician disagrees with this assessment see addendum.

## 2019-10-06 NOTE — Progress Notes (Signed)
Nutritional Support Services  Nutrition Assessment    Michael White Sr. 64 y.o. male   MRN: [id number]    Summary of Nutrition Recommendations:    1. Continue texture modified diet (mechanical soft) per SLP recs    2. Provide 1 Ensure Enlive PO, TID to optimize intake   Each Ensure Enlive provides 350 kcal and 20 gm protein.    3. Daily weights - standing scale preferred, if feasible    4. Rec MVI/minerals tablet daily        Patient discussed during ICU rounds.   -----------------------------------------------------------------------------------------------------------------                                                      Assessment Data:     Referral Source: Per policy, RN screen  Reason for Referral: Low BMI, MST 2 (unsure weight loss)    Nutrition: Pt awake though somewhat confused and only able to provide some nutrition information. Pt currently reports fine appetite, noted intake 50-75% of meals during admission. Pt tolerating texture modified diet, denies any GI distress or difficulty chewing/swallowing. Pt reports typical intake of 2 meals daily, though unable to say what specific foods he consumes. Denies taking any protein shakes or supplements PTA. Based on report, suspect that pt has been meeting < 75% of nutrition needs for prolonged period of time. Unable to obtain any information regarding food security/access to food at this time d/t AMS, no noted EtOH abuse (noted "occasional beer some weekends" per H&P social history). Pt reports his last known body weight was 150 lbs (68.2 kg), though is unsure the last time he weighed himself. Weight history and findings from NFPE further detailed below. Pt currently meets criteria for moderate malnutrition, low threshold for severe. Will follow at moderate nutrition risk.     Learning Needs: Encouraged adequate PO intake as tolerating. Discussed oral nutrition supplement options for increased kcal/protein intake, pt agreeable and denies any additional  questions at this time.     Hospital Admission: 64 yo male admitted on 5/3 with R thalamic hemorrhage, encephalopathy, and hypertensive urgency. Neurology and Neurosurgery consulted, no EVD or surgical intervention. Downgraded from ICU status.     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    PSH: has a past surgical history that includes CARDIAC ABLATION (04/15/2013).     Orders Placed This Encounter   Procedures   . Diet Mechanical Soft   . Ensure Enlive Supplement Quantity: A. One; Flavor: Strawberry; Frequency: TID (3 times a day) with meals     Intake:   5/4 - 50% x 1 meal, 75% x 1 meal  5/5 - 50% x 1 meal    ANTHROPOMETRIC  Height: 180.3 cm (5\' 11" )  Weight: 58.5 kg (128 lb 15.5 oz)  Weight Change: -0.17  IBW/kg (Calculated) Male: 78.21 kg  Body mass index is 17.99 kg/m.     Weight Monitoring Weight Weight Method   08/28/2016 70.852 kg    09/27/2016 70.761 kg    05/24/2018 72.576 kg Stated   10/04/2019 58.6 kg Bed Scale   10/05/2019 58.6 kg Bed Scale   10/06/2019 58.5 kg Bed Scale     Weight History Summary: Pt with minimal recent weight history per flowsheets. Pt reports last known body  weight of 150 lbs (68.2 kg) but is unsure when this was. Per Care Everywhere docs, pt with previous body weight of 160 lbs (72.6 kg) on 11/25/18, though unsure if this is stated weight (consistent with stated weight in December 2019). If accurate, this wound indicate 19.4% weight loss in < 11 months (significant). Will continue to monitor for reliable weight trend.     Physical Assessment: 5/5  Head: temple region: slight depression (moderate muscle loss), orbital region: slightly dark circles, somewhat hollow look (moderate fat loss) and buccal region: flat cheeks, minimal "bounce" (moderate fat loss)  Upper Body: clavicle bone region: visible in male, some protrusion in male (moderate muscle loss), clavicle and Acromion bone region: Acromion process may  slightly protrude (moderate muscle loss), upper arm region: some depth pinch, but not ample (moderate fat loss) and dorsal hand region: slightly depressed area between thumb-forefinger (moderate muscle loss)  Lower Body: anterior thigh region: mild depression on inner thigh (moderate muscle loss), patellar region: kneecap less prominent, more rounded (moderate muscle loss) and posterior calf region: not well developed (moderate muscle loss)  Edema: none per flowsheet  Skin: WDL per flowsheet  GI function: abd soft/nondistended, BS active, passing flatus, last BM 5/4 per flowsheet    ESTIMATED NEEDS    Total Daily Energy Needs: 1770 to 2065 kcal  Method for Calculating Energy Needs: 30 kcal - 35 kcal per kg  at 59 kg (Actual body weight)  Rationale: underweight BMI, noncritical    Total Daily Protein Needs: 88.5 to 118 g  Method for Calculating Protein Needs: 1.5 g - 2 g per kg at 59 kg (Actual body weight)  Rationale: underweight BMI, noncritical, no wounds, GFR > 60    Total Daily Fluid Needs: 1770 to 2065 ml  Method for Calculating Fluid Needs: 1 ml per kcal energy = 1770 to 2065 kcal       Pertinent Medications: pericolace    Pertinent labs: Na 134, all other unremarkable                                                              Nutrition Diagnosis      Moderate Protein Energy Malnutrition related to suspected poor PO intake in the setting of chronic illness (hypertension) as evidenced by suspected PO intake meeting < 75% of nutrition needs for >/= 1 month, moderate muscle loss (temporalis, pectoralis, deltoid, interosseous, gastrocnemius, quadriceps) and moderate fat loss (orbital and buccal region, triceps). - new    Pt is at low threshold for severe malnutrition given questionable weight loss; will continue to monitor for 2 qualifying criteria.                                                              Intervention     Nutrition recommendation - Please refer to top of note  Monitoring/Evaluation     Goals: PO intake/tolerance > 75% of meals/supplements by f/u - new    Nutrition Risk Level: Moderate (will follow up at least 1 time per week and PRN)       Adin Hector, RDN  Clinical Dietitian  Ext. [phone]

## 2019-10-06 NOTE — Progress Notes (Signed)
[facility] Consultation Note                                       Date Time: 10/06/19 8:20 AM  Patient Name: Michael White, Michael SR.  Requesting Physician: Lattie Haw, MD  Date of Admission: 10/04/2019    CC / Reason for Consultation: left-sided weakness           Assessment:     63y/o gentleman with hx of HTN presenting with sudden onset of left sided weakness and slurred speech. 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     Suspect hypertensive etiology of his ICH.       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    Borderline hypertension     on meds in past; BP normal without meds recently          Past Surgical Hx:     Past Surgical History:   Procedure Laterality Date    CARDIAC ABLATION  04/15/2013    ablation of slow pathway for AVNRT        Family Medical History:      Family History   Problem Relation Age of Onset    Heart disease Father         unknown what kind but not MI       Social Hx     Social History     Socioeconomic History    Marital status: Legally Separated     Spouse name: Not on file    Number of children: Not on file    Years of education: Not on file    Highest education level: Not on file   Occupational History    Not on file   Tobacco Use    Smoking status: Former Smoker     Packs/day: 0.50     Years: 14.00     Pack years: 7.00     Types: Cigarettes     Quit date: 08/28/2012     Years since quitting: 7.1    Smokeless tobacco: Never Used   Substance and Sexual Activity     Alcohol use: Yes     Comment: occ beer some weekends    Drug use: No    Sexual activity: Not on file   Other Topics Concern    Not on file   Social History Narrative    Not on file     Social Determinants of Health     Financial Resource Strain:     Difficulty of Paying Living Expenses:    Food Insecurity:     Worried About Programme researcher, broadcasting/film/video in the Last Year:     Barista in the Last Year:    Transportation Needs:     Freight forwarder (Medical):     Lack  of Transportation (Non-Medical):    Physical Activity:     Days of Exercise per Week:     Minutes of Exercise per Session:    Stress:     Feeling of Stress :    Social Connections:     Frequency of Communication with Friends and Family:     Frequency of Social Gatherings with Friends and Family:     Attends Religious Services:     Active Member of Clubs or Organizations:     Attends Engineer, structural:     Marital Status:    Intimate Partner Violence:     Fear of Current or Ex-Partner:     Emotionally Abused:     Physically Abused:     Sexually Abused:        Meds     Home :   Prior to Admission medications    Medication Sig Start Date End Date Taking? Authorizing Provider   acetaminophen (TYLENOL) 325 MG tablet Take 2 tablets (650 mg total) by mouth every 4 (four) hours as needed for Pain 05/24/18   Johnsie Cancel, Stacy L, DO   cyclobenzaprine (FLEXERIL) 5 MG tablet Take 1 tablet (5 mg total) by mouth 2 (two) times daily as needed for Muscle spasms 05/24/18   Johnsie Cancel, Stacy L, DO   ibuprofen (ADVIL,MOTRIN) 800 MG tablet Take 1 tablet (800 mg total) by mouth every 8 (eight) hours as needed for Pain. 09/23/15   Donny Pique, MD      Inpatient :       Allergies    Patient has no known allergies.      Review of Systems     All other systems were reviewed and are negative except for that mentioned in the HPI    Physical Exam:   Temp:  [98.4 F (36.9 C)-100 F (37.8 C)] 98.7 F (37.1 C)  Heart Rate:  [59-100] 93  Resp  Rate:  [14-47] 20  BP: (110-182)/(57-104) 127/59       General: in no acute distress. Cooperative with the exam  Neck: supple  CVS: warm and well perfused  Resp: no respiratory distress  Extremities: no pedal edema, no rashes noted    Neurological Examination:  MSE: slightly lethargic but easily aroused by voice, oriented x 3, speech fluent without word-finding difficulty or paraphasic errors, follows simple commands  CN: Pupils 4-->105mm b/l, no gaze deviation, EOMI,  reports sensation in V1-V3 intact to LT/temp, smile symmetric, mild dysarthria.  Motor: 5/5 strength in RUE and RLE. LUE proximal 4+/5, distal 4/5. LLE proximal and distal 4+/5.  Sensory: Intact and symmetric to LT x4 extremities.        Labs:     Results     Procedure Component Value Units Date/Time    Phosphorus [[id number]] Collected: 10/06/19 0349    Specimen: Blood Updated: 10/06/19 0505     Phosphorus 2.9 mg/dL     Hemolysis index [[id number]]  (Abnormal) Collected: 10/06/19 0349     Updated: 10/06/19 0505     Hemolysis Index 35    GFR [[id number]] Collected: 10/06/19 0349     Updated: 10/06/19 0505     EGFR >60.0    Comprehensive metabolic panel [[id number]]  (Abnormal) Collected: 10/06/19 0349    Specimen: Blood Updated: 10/06/19 0505     Glucose 94 mg/dL      BUN 62.9 mg/dL      Creatinine 0.9 mg/dL      Sodium 528 mEq/L  Potassium 4.0 mEq/L      Chloride 103 mEq/L      CO2 21 mEq/L      Calcium 8.8 mg/dL      Protein, Total 6.6 g/dL      Albumin 3.6 g/dL      AST (SGOT) 18 U/L      ALT 16 U/L      Alkaline Phosphatase 88 U/L      Bilirubin, Total 1.2 mg/dL      Globulin 3.0 g/dL      Albumin/Globulin Ratio 1.2     Anion Gap 10.0    Magnesium [[id number]] Collected: 10/06/19 0349    Specimen: Blood Updated: 10/06/19 0505     Magnesium 1.7 mg/dL     PT/APTT [[id number]]  (Abnormal) Collected: 10/06/19 0349     Updated: 10/06/19 0456     PT 14.1 sec      PT INR 1.2     PTT 26 sec     CBC and differential [[id number]]  (Abnormal) Collected: 10/06/19  0349    Specimen: Blood Updated: 10/06/19 0442     WBC 7.92 x10 3/uL      Hgb 13.1 g/dL      Hematocrit 08.6 %      Platelets 192 x10 3/uL      RBC 6.26 x10 6/uL      MCV 68.1 fL      MCH 20.9 pg      MCHC 30.8 g/dL      RDW 22 %      MPV 10.5 fL      Neutrophils 61.9 %      Lymphocytes Automated 26.9 %      Monocytes 9.5 %      Eosinophils Automated 1.0 %      Basophils Automated 0.3 %      Immature Granulocytes 0.4 %      Nucleated RBC 0.0 /100 WBC      Neutrophils Absolute 4.91 x10 3/uL      Lymphocytes Absolute Automated 2.13 x10 3/uL      Monocytes Absolute Automated 0.75 x10 3/uL      Eosinophils Absolute Automated 0.08 x10 3/uL      Basophils Absolute Automated 0.02 x10 3/uL      Immature Granulocytes Absolute 0.03 x10 3/uL      Absolute NRBC 0.00 x10 3/uL           Rads:     Results for orders placed or performed during the hospital encounter of 09/23/15   CT Head WO Contrast    Narrative    History: Trauma.    Comparisons: 04/14/2013.    Technique: Axial CT brain obtained from vertex to skull base without  contrast. Axial CT facial bones with coronal and sagittal  reconstructions.    A combination of automatic exposure control, adjustment of the mA and/or  kV according to patient size and/or use of iterative reconstruction  technique was utilized.    Findings:  Gray-white matter differentiation preserved.        Basal ganglia, thalami, midbrain, pons unremarkable.    No mass, mass effect, or midline shift. No intra-axial or extra-axial  hemorrhage or collection.    Ventricles, sulci, cisterns age-appropriate in size and configuration.    Calvarium appears intact.         No maxillofacial bone fractures or bony dehiscence.     Orbits and globes intact.     Frontal sinuses, sphenoid sinuses, maxillary sinuses, and ethmoid  air  cells clear. Mastoid air cells, middle ear cavities clear.    Soft tissue unremarkable.      Impression       No acute intracranial or maxillofacial abnormality.    Adaline Sill, MD    09/23/2015 11:04 PM             Elspeth Cho  [facility]  Spectralink [phone]

## 2019-10-06 NOTE — Progress Notes (Signed)
NEUROSURGERY PROGRESS NOTE    Date Time: 10/06/19 10:13 AM  Patient Name: Michael White, Michael SR.  Consulting Attending Physician: Dr. Jonathon Jordan  Covered By:  Neurosurgery Coverage:  Monday - Saturday (0700-1900) - [employer] [phone] ([place]) or Pager [phone]  After 1900 and on Sundays/Holidays - Pager [phone]     *for medical concerns/orders please call/page primary medicine team*      Assessment:   64 y.o. male brought in by EMS after being found altered in his care.  On arrival to ED BP was 213/86. CT head revealed 2 cm x 1 cm right thalamic IPH with extension to right lateral ventricle. No MLS. ICH score= 1(IVH)  On physical examination, patient has left sided weakness and dysarthria.  GCS 15.  rHCT 5/3 stable     Plan:     - Q 4hr neuro checks  - HOB > 30 degrees  - Keppra BID x 7 days  - SBP goal < 140 mm hg   - No bloodthinners / ASA / NSAIDs   - PT / OT / SLP  - OK to TTF from NSGY standpoint   -No indication for neurosurgical intervention or further outpatient follow up  -NSGY will sign off. Please call for further concerns.        Interim History:     Drowsy but more alert this am than yesterday morning. Opens eyes easily with verbal stim. States he feels much better today. Denies HA.     Medications:     Current Facility-Administered Medications   Medication Dose Route Frequency    amLODIPine  5 mg Oral Daily    hydrALAZINE  50 mg Oral Q8H SCH    levETIRAcetam  750 mg Intravenous Q12H SCH    senna-docusate  1 tablet Oral Q12H [facility]         Physical Exam:     Vitals:    10/06/19 0900   BP: 130/72   Pulse: 61   Resp: 15   Temp:    SpO2: 99%       Intake and Output Summary (Last 24 hours) at Date Time    Intake/Output Summary (Last 24 hours) at 10/06/2019 1013  Last data filed at 10/06/2019 0900  Gross per 24 hour   Intake 1380 ml   Output 575 ml   Net 805 ml       Neuro exam:   Opens eyes spontaneously,  Awake, oriented x 3   Dysarthric   Attention span short, closes eyes immediately   PERRL, EOMI tracks across room   Facial  sensation intact  Left facial droop   Hearing intact to conversation   Tongue midline  Shoulder shrug weak on left   Motor:              Arms:       Deltoid  Bicep Tricep Grip   Right 5 5 5 5    Left  3 3 3 3                   Legs:       HF KE KF DF PF EHL   Right 5 5 5 5 5 5    Left  4 4 4 4 4 4         Light touch  intact        RUE: full strength   LUE: antigravity      RLE: full strength   BJY:NWGN gravity, drifts to midline  Gait not assessed due to clinical condition     GCS 15      Labs:     Lab Results   Component Value Date    WBC 7.92 10/06/2019    HGB 13.1 10/06/2019    HCT 42.6 10/06/2019    MCV 68.1 (L) 10/06/2019    PLT 192 10/06/2019     Lab Results   Component Value Date    NA 134 (L) 10/06/2019    K 4.0 10/06/2019    CL 103 10/06/2019    CO2 21 (L) 10/06/2019     Lab Results   Component Value Date    INR 1.2 (H) 10/06/2019    INR 1.2 (H) 10/05/2019    INR 1.2 (H) 10/04/2019    PT 14.1 (H) 10/06/2019    PT 14.2 (H) 10/05/2019    PT 13.8 (H) 10/04/2019       Rads:     Radiology Results (24 Hour)       ** No results found for the last 24 hours. **              Signed by: April M Cournoyer PA-C      NEUROSURGERY ATTENDING ADDENDUM    I met, interviewed and examined the patient today. I also reviewed the patient's medications, vitals, lab results and imaging studies myself. I agree with the above note.    Thank you for the opportunity of allowing me to participate in the care of [name], MD FAANS  Section Chief of Neurological Surgery   [facility]  Minimally Invasive and Complex Spine Surgery & General Neurosurgery  Department of Neurosciences   [facility] Neurosurgery  Assistant Professor of Neurosurgery   [facility]  Address: [address]   [place], [place] [address]  Phone [phone]   Fax [phone]

## 2019-10-06 NOTE — Progress Notes (Signed)
Neurosurgery    No need for further outpatient follow up. NSGY will sign off.    April Cournoyer, PA-C

## 2019-10-06 NOTE — Plan of Care (Signed)
Problem: Compromised Tissue integrity  Goal: Damaged tissue is healing and protected  Outcome: Progressing  Goal: Nutritional status is improving  Outcome: Progressing     Problem: Day of Admission - Stroke  Goal: Core/Quality measure requirements - Admission  Outcome: Progressing     Problem: Every Day - Stroke  Goal: Core/Quality measure requirements - Daily  Outcome: Progressing  Goal: Neurological status is stable or improving  Outcome: Progressing  Goal: Stable vital signs and fluid balance  Outcome: Progressing  Goal: Patient will maintain adequate oxygenation  Outcome: Progressing  Goal: Patient's risk of aspiration will be minimized  Outcome: Progressing  Flowsheets (Taken 10/06/2019 0119)  Patient's risk of aspiration will be minimized:   Complete new dysphagia screen for any change in status: Keep patient NPO if patient fails   Place swallow precaution signage above bed   Monitor/assess for signs of aspiration (tachypnea, cough, wheezing, clearing throat, hoarseness after eating, decrease in SaO2   Thicken liquids as recommended by Speech Pathologist   Order modified texture diet as recommend by Speech Pathologist   Assess and monitor ability to swallow  Goal: Nutritional intake is adequate  Outcome: Progressing  Goal: Elimination patterns are normal or improving  Outcome: Progressing  Flowsheets (Taken 10/06/2019 0119)  Elimination patterns are normal or improving:   Monitor for abdominal distension   Assess for normal bowel sounds   Report abnormal assessment to physician  Goal: Skin integrity is maintained or improved  Outcome: Progressing  Goal: Neurovascular status is stable or improving  Outcome: Progressing  Goal: Effective coping demonstrated  Outcome: Progressing

## 2019-10-06 NOTE — Treatment Plan (Cosign Needed)
Brief summary of why patient was admitted and any issues in the past 24 hours (1 min max). After the brief summary, then proceed to A FASTHUGS BID.   Activity   [x]  Y / []  N  PMP Activity: Step 3 - Bed Mobility (10/06/2019 10:00 AM)      Shift target level:    PT/OT Consult: [x]  Y / []  N   Mobility level:   PMP Contraindicated  Step 1 - Bedrest, Step 2 - Supine Exercise, Step 3- Bed Mobility, Step 4 - Dangle at Bedside,  Step 5 - Chair, Step 6 - Walks in Room,   Step - 7 Walks out of room   Feeding/Fluids     [x]  Y    /   []  N  Diet Mechanical Soft  Ensure Enlive Supplement Quantity: A. One; Flavor: Strawberry; Frequency: TID (3 times a day) with meals     Intake/Output Summary (Last 24 hours) at 10/06/2019 1049  Last data filed at 10/06/2019 0900  Gross per 24 hour   Intake 1380 ml   Output 725 ml   Net 655 ml       Diet ordered?  NPO?  Tube feedings initiated?  What kind? What rate?  Can they be advanced?  Need free water?  What IV fluids are running?  Is the I&O unbalanced? Mean arterial pressure >65? If patient receiving any vassopresor equivalent dose of less than 12.5 mcg/min  (Malnutrition leads to worse outcomes)       Analgesia  Not use for sedation     [x]  Y    /    []  N    /   []  NA    Pain/CPOT Score:   Pain Score: 2-mild pain (Pain)    (CPOT) Is there adequate pain relief ordered?  Is there a pain plan of care? Can opioids be reduced, and/or replaced with non-opioid options?   (Pain affects psychological and physiological recovery)   Sedation/  Paralytic?   []  Y    /    []  N     /   [x]  NA  []  Y    /    []  N     /   [x]  NA    Target RASS Score:  Patient Actual RASS Score:   RASS Score: Drowsy    CAM-ICU: Delirium Present? []  Y    /    []  N  IF CAM Positive - use the acronym THINK  Positive or Negative for Delirium?: Negative  SAT Performed: []  Y    /    []  N  /   []  NA   Are daily sedation vacations performed?  Is RASS 0 to -2 being maintained (calm, comfortable, collaborative)?  (Over sedation is  correlated with harmful effects)  THINK - Toxic situations and medications, Hypoxemia, Infection/Sepsis, inflammation, immobilization, Non-pharmacological interventions, K+ or other electrolyte interventions     Thromboprophylaxis   []  Y    /    [x]  N    Last Anticoagulant Admin          No anticoagulants administered       Is lovenox or heparin in use to avoid DVT?  Are the coags abnormal?  Is anticoagulation contraindicated?  Why?  (DVT leads to increased mortality and morbidity)         HAPI Prevention     [x]  Y    /   []   N    Braden score:   Braden  Scale Score: 19 Braden score? Does the patient need a specialty bed?  Are prevention methods in place? Are skin checks being done under devices?  Are pictures taken of existing HAPIs?  (Prevention is the goal; HAPIs increase LOS and costs)     Ulcer Prevention [x]  Y    /  []  N  If indicated *ONLY INDICATED IF coagulopathy, mechanical ventilation for >48 hours, traumatic brain injury, history of GI ulceration or bleeding within past year, extensive burns   Glucose Control     []  Y    /  [x]   N  Recent Labs     10/06/19  0349 10/05/19  0313 10/04/19  0949 10/04/19  0936   Glucose 94 95 92  --    Whole Blood Glucose POCT  --   --   --  84      Are blood sugars under control (<180)?  Need insulin? Need long acting insulin if only sliding scale is ordered?  (Controlled glucose leads to shorter ICU stays) Any medications that can be adjusted? i.e. Steroids?    Spontaneous     Breathing Trial []  Y    /   []  N    /   [x]  NA    Did the patient pass the SAT today? Are they ready for CPAP? Is their sedation at a minimum?  Current vent settings? Vital signs and hemodynamics?  (Delayed weaning leads to complications, VAP, increased LOS)     Respiratory Status O2 Device:   O2 Device: None (Room air)  Current Settings:     If COVID +, patient able to self-prone: []  Y /  []  N    Incentive Spirometer Volume:        Bowel Care   [x]  Y    /   []  N Bowel sounds? Passing flatus, BMs?   Distention present?  Need C.Diff culture?  Laxative? Vomiting?   (Diarrhea leads to electrolyte imbalance, dehydration, skin breakdown, delirium;  Constipation leads to discomfort, feeding intolerance, delirium)   Indwelling Catheter    Removal  Foley?  Central Line? []  Y    /  [x]  N     /   []  NA                 Vasopressors?  Can Foley be removed? Can we use Purewick or condom cath?    Can Central Line be removed?  Can Arterial Line be removed?  Do we need a PICC?  (Longer dwell time increase risk of infection, discomfort, and reduces mobility)     De-escalation of      Antibiotics   []  Y    /   []  N     /   []  NA What antibiotics are in use?  What are we treating? What culture/sensitivity results are available?   (Unnecessary antibiotic use promotes development of resistance   Miscellaneous Restraints []  Y / [x]  N  Palliative Care []  Y / [x]  N  Transfer or Discharge: []  Y / [x]  N  WRTC Trigger: []  Y / [x]  N  Family Conference: []  Y / [x]  N      Core Measures AMI:  [] ASA [] Beta-Blocker  CHF: [] EF% [] ACE or ARB  Stroke: [] PT/OT [] Swallow Screen   PNA: [] Immunization [] Bld culture w/in 24 hrs        Goals for today Possible stepdown if neurosurgey clears pt. For transfer to telemetry.     Brief summary of patient goals that  everyone agreed upon.    Participants Physician: [x]  Y / []  N  Nursing:[x]  Y / []  N  Pharmacy: [x]  Y / []  N  Nutrition: [x]  Y / []  N  Case Management: []  Y / []  N  Respiratory: []  Y / []  N  Ethics:[]  Y / []  N  PT/OT: []  Y / []  N

## 2019-10-06 NOTE — Consults (Addendum)
[facility]  MEDICINE CONSULT NOTE      Patient: Michael LARICCIA Sr.  Date: 10/04/2019   DOB: Mar 31, 1956  Admission Date: 10/04/2019   MRN: [id number]  Attending: Dallie Piles, MD       Reason for Consult: transfer out of ICU, assume care   Requesting Physician: Lattie Haw, MD  Consulting Physician: Dallie Piles    History Gathered From: patient, chart review, d/w ICU RN     Chief Complaint   Patient presents with    Altered Mental Status    Cerebrovascular Accident      HISTORY AND PHYSICAL     Michael DASARO Sr. is a 64 y.o. male with a PMH of hypertension presented to the ED for confusion.    CT head in the ER showed acute hemorrhagic infarction in the right thalamus with intraventricular extension.  He was admitted to the ICU, placed on Cardene drip for hypertensive urgency.  Neurosurgery and neurology were consulted.  Etiology of ICH is suspected to be due to hypertension. Neurosurgery recommends no indication for neurosurgical intervention.  Weaned off Cardene drip.  Mental status has improved. Patient reports generalized pain otherwise no specific complaints.   Denies headache, chest pain, shortness of breath, abdominal pain, nausea, vomiting, diarrhea.      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    Borderline hypertension     on meds in past; BP normal without meds recently       Past Surgical History:   Procedure Laterality Date    CARDIAC ABLATION  04/15/2013    ablation of slow pathway for AVNRT       Prior to Admission medications    Not on File       No Known Allergies    Family History   Problem Relation Age of Onset    Heart disease Father         unknown what kind but not MI       Social History     Tobacco Use    Smoking status: Former Smoker     Packs/day: 0.50     Years: 14.00     Pack years: 7.00     Types: Cigarettes     Quit date: 08/28/2012      Years since quitting: 7.1    Smokeless tobacco: Never Used   Substance Use Topics    Alcohol use: Yes     Comment: occ beer some weekends    Drug use: No       REVIEW OF SYSTEMS   Pertinent positives and negatives as noted in the HPI. All other systems were reviewed and are negative.     PHYSICAL EXAM     Vital Signs (most recent): BP 135/62    Pulse 94    Temp 98.5 F (36.9 C) (Axillary)    Resp (!) 28    Ht 1.803 m (5\' 11" )    Wt 58.5 kg (128 lb 15.5 oz)    SpO2 100%    BMI 17.99 kg/m   Constiutional: slightly drowsy but easily arouses to voice, in NAD. Alert and cooperative. Nontoxic appearing.   HEENT: NCAT, EOMI, PERRL, conjunctivae/corneas clear.   No scleral icterus. Oral mucosa moist.    Neck: Supple, no meningismus   Cardiovascular: RRR, normal S1 S2, no murmurs   Respiratory: Normal  rate. CTAB, unlabored respiratory effort, no wheezes, crackles.   Gastrointestinal: +BS, soft, non-tender, non-distended. No rebound, rigidity or guarding.   Genitourinary: no suprapubic or costovertebral angle tenderness  Musculoskeletal: ROM and motor strength grossly normal. No joint tenderness, deformity or swelling  Skin: no rashes, jaundice or other lesions. normal coloration and turgor  Extremities: atraumatic, no edema. Pulses 2+. Capillary refill < 3s  Neurologic: AAOx3(knows person place and time), mild dysarthria.   RUE/RLE 5/5 strength. LUE/LLE 4/5 Sensation equal and intact to light touch.   Answering questions and following commands.   Psychiatric: affect and mood appropriate.       LABS & IMAGING     Recent Results (from the past 24 hour(s))   CBC and differential    Collection Time: 10/06/19  3:49 AM   Result Value Ref Range    WBC 7.92 3.10 - 9.50 x10 3/uL    Hgb 13.1 12.5 - 17.1 g/dL    Hematocrit 16.1 09.6 - 49.6 %    Platelets 192 142 - 346 x10 3/uL    RBC 6.26 (H) 4.20 - 5.90 x10 6/uL    MCV 68.1 (L) 78.0 - 96.0 fL    MCH 20.9 (L) 25.1 - 33.5 pg    MCHC 30.8 (L) 31.5 - 35.8 g/dL    RDW 22 (H) 11 - 15 %     MPV 10.5 8.9 - 12.5 fL    Neutrophils 61.9 None %    Lymphocytes Automated 26.9 None %    Monocytes 9.5 None %    Eosinophils Automated 1.0 None %    Basophils Automated 0.3 None %    Immature Granulocytes 0.4 None %    Nucleated RBC 0.0 0.0 - 0.0 /100 WBC    Neutrophils Absolute 4.91 1.10 - 6.33 x10 3/uL    Lymphocytes Absolute Automated 2.13 0.42 - 3.22 x10 3/uL    Monocytes Absolute Automated 0.75 0.21 - 0.85 x10 3/uL    Eosinophils Absolute Automated 0.08 0.00 - 0.44 x10 3/uL    Basophils Absolute Automated 0.02 0.00 - 0.08 x10 3/uL    Immature Granulocytes Absolute 0.03 0.00 - 0.07 x10 3/uL    Absolute NRBC 0.00 0.00 - 0.00 x10 3/uL   Comprehensive metabolic panel    Collection Time: 10/06/19  3:49 AM   Result Value Ref Range    Glucose 94 70 - 100 mg/dL    BUN 04.5 9.0 - 40.9 mg/dL    Creatinine 0.9 0.7 - 1.3 mg/dL    Sodium 811 (L) 914 - 145 mEq/L    Potassium 4.0 3.5 - 5.1 mEq/L    Chloride 103 100 - 111 mEq/L    CO2 21 (L) 22 - 29 mEq/L    Calcium 8.8 8.5 - 10.5 mg/dL    Protein, Total 6.6 6.0 - 8.3 g/dL    Albumin 3.6 3.5 - 5.0 g/dL    AST (SGOT) 18 5 - 34 U/L    ALT 16 0 - 55 U/L    Alkaline Phosphatase 88 38 - 106 U/L    Bilirubin, Total 1.2 0.2 - 1.2 mg/dL    Globulin 3.0 2.0 - 3.6 g/dL    Albumin/Globulin Ratio 1.2 0.9 - 2.2    Anion Gap 10.0 5.0 - 15.0   PT/APTT    Collection Time: 10/06/19  3:49 AM   Result Value Ref Range    PT 14.1 (H) 10.1 - 12.9 sec    PT INR 1.2 (H) 0.9 - 1.1  PTT 26 (L) 27 - 39 sec   Magnesium    Collection Time: 10/06/19  3:49 AM   Result Value Ref Range    Magnesium 1.7 1.6 - 2.6 mg/dL   Phosphorus    Collection Time: 10/06/19  3:49 AM   Result Value Ref Range    Phosphorus 2.9 2.3 - 4.7 mg/dL   Hemolysis index    Collection Time: 10/06/19  3:49 AM   Result Value Ref Range    Hemolysis Index 35 (H) 0 - 18   GFR    Collection Time: 10/06/19  3:49 AM   Result Value Ref Range    EGFR >60.0      EKG: 10/04/19 my read: NSR HR 64 no acute ST changes     IMAGING:  CT Head WO  Contrast    Result Date: 10/04/2019   Essentially stable appearance to the hemorrhagic infarct involving the right thalamus with intraventricular extension. No new hemorrhage or hydrocephalus is seen. There is minimal, 1 mm, right to left midline shift. Ida Rogue, MD  10/04/2019 7:53 PM    CT Head WO Contrast    Result Date: 10/04/2019  1.  Acute hemorrhagic infarct in the right thalamus with intraventricular extension. No herniation or hydrocephalus. Findings similar to prior study. 2.  Chronic microvascular changes. Raynelle Dick, MD  10/04/2019 11:13 AM    CT Head without Contrast    Result Date: 10/04/2019  1.  Acute hemorrhagic infarct in the right thalamus with intraventricular extension. No herniation or hydrocephalus. 2.  Chronic microvascular changes. Raynelle Dick, MD  10/04/2019 9:49 AM    XR Chest  AP Portable    Result Date: 10/04/2019   No acute pulmonary or pleural disease. Sandie Ano, MD  10/04/2019 11:18 AM          ASSESSMENT & PLAN   Patient Active Hospital Problem List:   Acute CVA (cerebrovascular accident) (10/04/2019)           Michael Haggard Sr. is a 64 y.o. male with a PMH of hypertension presented to the ED for confusion.    CT head in the ER showed acute hemorrhagic infarction in the right thalamus with intraventricular extension.  He was admitted to the ICU, placed on Cardene drip for hypertensive urgency.  Neurosurgery and neurology consulted.  Etiology of ICH is suspected to be due to hypertension. Neurosurgery recommends no indication for neurosurgical intervention.  BP stable off Cardene drip.  Mental status is noted to be improving.  Stable for transfer to telemetry.       #Acute right thalamic hemorrhagic infarct  -likely due to hypertensive etiology. Repeat CT head stable.   -Neurosurgery recommends no indication for neurosurgical intervention.  -Neurology following  -echo pending   -continue keppra BID x 7 days per NS recs   -BP control, SBP goal <140   -no AC/ASA/NSAIDs  -HOB>30 degrees    -neuro checks  -PT/OT/SLP, d/c planning to acute rehab        #Hypertensive urgency  -status post Cardene drip. BP stable. Continue amlodipine and hydralazine.      #Acute metabolic encephalopathy   -due to above, ICH. Noted to be improving   -fall, aspiration, seizure precautions         DVT prophylaxis - SCDs    Thank for for the opportunity to be involved in Michael Haggard Sr.'s care.  Hospitalist service will assume care of this patient.  Signed,  Dallie Piles       This chart was generated using hospital voice-recognition software which does not employ spell-checking or grammar-checking features. It was dictated, all or in part, in a busy and often noisy patient care environment. I have taken all usual measures to dictate carefully and to review all aspects this chart. Nonetheless, given the known and well-documented performance characteristics of VR software in such patient care environments, this dictation still may contain unrecognized and wholly unintended errors or omissions

## 2019-10-06 NOTE — Progress Notes (Signed)
Pt c/o of not sleeping Md informed new medication ordered, please see MAR

## 2019-10-06 NOTE — Treatment Plan (Signed)
Activity   [x]  Y / []  N  PMP Activity: Step 2 - Supine Exercises (10/06/2019  2:00 AM)      Shift target level:    PT/OT Consult: [x]  Y / []  N    Feeding/Fluids     [x]  Y    /   []  N  Diet Mechanical Soft     Intake/Output Summary (Last 24 hours) at 10/06/2019 0542  Last data filed at 10/06/2019 0000  Gross per 24 hour   Intake 1063.33 ml   Output 675 ml   Net 388.33 ml              Analgesia  Not use for sedation     []  Y    /    [x]  N    /   []  NA    Pain/CPOT Score:   Pain Score: 0-No pain (Pain)    (CPOT)    Sedation/  Paralytic?   []  Y    /    []  N     /   [x]  NA  []  Y    /    []  N     /   [x]  NA    Target RASS Score:  Patient Actual RASS Score:   RASS Score: Drowsy    CAM-ICU: Delirium Present? []  Y    /    []  N  IF CAM Positive - use the acronym THINK     SAT Performed: []  Y    /    []  N  /   []  NA        Thromboprophylaxis   []  Y    /    [x]  N    Last Anticoagulant Admin          No anticoagulants administered              HAPI Prevention     [x]  Y    /   []   N    Braden score:   Braden Scale Score: 19      Ulcer Prevention [x]  Y    /  []  N  If indicated    Glucose Control     [x]  Y    /  []   N  Recent Labs     10/06/19  0349 10/05/19  0313 10/04/19  0949 10/04/19  0936   Glucose 94 95 92  --    Whole Blood Glucose POCT  --   --   --  84         Spontaneous     Breathing Trial []  Y    /   []  N    /   [x]  NA       Respiratory Status O2 Device:   O2 Device: None (Room air)  Current Settings:     If COVID +, patient able to self-prone: []  Y /  []  N    Incentive Spirometer Volume:        Bowel Care   [x]  Y    /   []  N    Indwelling Catheter    Removal  Foley?  Central Line? []  Y    /  []  N     /   [x]  NA                 Vasopressors?     De-escalation of      Antibiotics   []  Y    /   []   N     /   [x]  NA    Miscellaneous Restraints []  Y / []  N  Palliative Care []  Y / []  N  Transfer or Discharge: []  Y / []  N  WRTC Trigger: []  Y / []  N  Family Conference: []  Y / []  N      Core Measures AMI:  [] ASA [] Beta-Blocker  CHF:  [] EF% [] ACE or ARB  Stroke: [] PT/OT [] Swallow Screen   PNA: [] Immunization [] Bld culture w/in 24 hrs        Goals for today         Participants Physician: []  Y / []  N  Nursing:[]  Y / []  N  Pharmacy: []  Y / []  N  Nutrition: []  Y / []  N  Case Management: []  Y / []  N  Respiratory: []  Y / []  N  Ethics:[]  Y / []  N  PT/OT: []  Y / []  N

## 2019-10-07 DIAGNOSIS — I619 Nontraumatic intracerebral hemorrhage, unspecified: Secondary | ICD-10-CM | POA: Diagnosis present

## 2019-10-07 DIAGNOSIS — G9341 Metabolic encephalopathy: Secondary | ICD-10-CM | POA: Diagnosis present

## 2019-10-07 DIAGNOSIS — I160 Hypertensive urgency: Secondary | ICD-10-CM | POA: Diagnosis present

## 2019-10-07 LAB — ECHOCARDIOGRAM ADULT COMPLETE W CLR/ DOPP WAVEFORM
AV Mean Gradient: 6.634
AV Peak Velocity: 173.36
Ao Root Diameter (2D): 3.217
IVS Diastolic Thickness (2D): 1.143
LA Dimension (2D): 2.84
LA Volume Index (BP A-L): 0.032
LVID diastole (2D): 4.42
LVID systole (2D): 2.334
MV Area (PHT): 4.413
MV E/A: 0.714
MV E/A: 0.714
MV E/e' (Average): 10.631
RV Basal Diastolic Dimension: 3.371
RV Systolic Pressure: 11.322
RV Systolic Pressure: 11.322
TAPSE: 2.695

## 2019-10-07 LAB — CBC AND DIFFERENTIAL
Absolute NRBC: 0 10*3/uL (ref 0.00–0.00)
Basophils Absolute Automated: 0.03 10*3/uL (ref 0.00–0.08)
Basophils Automated: 0.3 %
Eosinophils Absolute Automated: 0.1 10*3/uL (ref 0.00–0.44)
Eosinophils Automated: 1.1 %
Hematocrit: 42.3 % (ref 37.6–49.6)
Hgb: 13 g/dL (ref 12.5–17.1)
Immature Granulocytes Absolute: 0.02 10*3/uL (ref 0.00–0.07)
Immature Granulocytes: 0.2 %
Lymphocytes Absolute Automated: 2.6 10*3/uL (ref 0.42–3.22)
Lymphocytes Automated: 29.3 %
MCH: 20.7 pg — ABNORMAL LOW (ref 25.1–33.5)
MCHC: 30.7 g/dL — ABNORMAL LOW (ref 31.5–35.8)
MCV: 67.4 fL — ABNORMAL LOW (ref 78.0–96.0)
MPV: 9.8 fL (ref 8.9–12.5)
Monocytes Absolute Automated: 1.11 10*3/uL — ABNORMAL HIGH (ref 0.21–0.85)
Monocytes: 12.5 %
Neutrophils Absolute: 5 10*3/uL (ref 1.10–6.33)
Neutrophils: 56.6 %
Nucleated RBC: 0 /100 WBC (ref 0.0–0.0)
Platelets: 366 10*3/uL — ABNORMAL HIGH (ref 142–346)
RBC: 6.28 10*6/uL — ABNORMAL HIGH (ref 4.20–5.90)
RDW: 22 % — ABNORMAL HIGH (ref 11–15)
WBC: 8.86 10*3/uL (ref 3.10–9.50)

## 2019-10-07 LAB — BASIC METABOLIC PANEL
Anion Gap: 11 (ref 5.0–15.0)
BUN: 10 mg/dL (ref 9.0–28.0)
CO2: 21 mEq/L — ABNORMAL LOW (ref 22–29)
Calcium: 9.1 mg/dL (ref 8.5–10.5)
Chloride: 104 mEq/L (ref 100–111)
Creatinine: 0.9 mg/dL (ref 0.7–1.3)
Glucose: 101 mg/dL — ABNORMAL HIGH (ref 70–100)
Potassium: 3.9 mEq/L (ref 3.5–5.1)
Sodium: 136 mEq/L (ref 136–145)

## 2019-10-07 LAB — GFR: EGFR: >60

## 2019-10-07 LAB — HEMOLYSIS INDEX: Hemolysis Index: 4 (ref 0–18)

## 2019-10-07 MED ORDER — LEVETIRACETAM 750 MG PO TABS
750.0000 mg | ORAL_TABLET | Freq: Two times a day (BID) | ORAL | 0 refills | Status: DC
Start: 2019-10-07 — End: 2019-10-20

## 2019-10-07 MED ORDER — AMLODIPINE BESYLATE 5 MG PO TABS
5.0000 mg | ORAL_TABLET | Freq: Every day | ORAL | Status: DC
Start: 2019-10-08 — End: 2019-10-20

## 2019-10-07 MED ORDER — HYDRALAZINE HCL 50 MG PO TABS
50.0000 mg | ORAL_TABLET | Freq: Three times a day (TID) | ORAL | Status: DC
Start: 2019-10-07 — End: 2019-10-20

## 2019-10-07 NOTE — Discharge Summary (Signed)
[facility]      Patient: Michael COURY Sr.  Admission Date: 10/04/2019   DOB: 12/14/1955  Discharge Date: 10/07/2019    MRN: [id number]  Discharge Attending:Kenza Munar T Jaquane Boughner     Referring Physician: Pcp, None, MD  PCP: Pcp, None, MD       DISCHARGE SUMMARY     Discharge Information   Admission Diagnosis:   Acute CVA (cerebrovascular accident)    Discharge Diagnosis:   Active Hospital Problems    Diagnosis    Acute right thalamic hemorrhagic infarct    Hypertensive urgency    Acute metabolic encephalopathy    Acute CVA (cerebrovascular accident)    Essential hypertension        Admission Condition: Poor  Discharge Condition: Stable  Consultants: Neurology, neurosurgery, ICU  Functional Status: Below baseline  Discharged to: Acute rehab    Discharge Medications:     Medication List      START taking these medications    amLODIPine 5 MG tablet  Commonly known as: NORVASC  Take 1 tablet (5 mg total) by mouth daily  Start taking on: Oct 08, 2019     hydrALAZINE 50 MG tablet  Commonly known as: APRESOLINE  Take 1 tablet (50 mg total) by mouth every 8 (eight) hours     levETIRAcetam 750 MG tablet  Commonly known as: KEPPRA  Take 1 tablet (750 mg total) by mouth every 12 (twelve) hours for 7 days           Where to Get Your Medications      Information about where to get these medications is not yet available    Ask your nurse or doctor about these medications   amLODIPine 5 MG tablet   hydrALAZINE 50 MG tablet   levETIRAcetam 750 MG tablet             Hospital Course   Presentation History     Michael Goncalves. is a 64 y.o. male with past medical history significant of hypertension who has not seen any physician for many years what brought into emergency room by paramedics when he was found confused by his coworker.  CT of the head done in emergency room showed acute hemorrhagic infarction in the right thalamus.  At present patient is awake alert oriented to self however has been intermittently  confused.  See HPI for details.    Hospital Course (3 Days)     Michael GLOSS Sr. is a 64 y.o. male with a PMH of hypertension presented to the ED for confusion.    CT head in the ER showed acute hemorrhagic infarction in the right thalamus with intraventricular extension.  He was admitted to the ICU, placed on Cardene drip for hypertensive urgency.  Neurosurgery and neurology were consulted.  Etiology of ICH is suspected to be due to hypertension. Neurosurgery recommends no indication for neurosurgical intervention.  Weaned off Cardene drip.  Mental status has improved. Patient reports generalized pain otherwise no specific complaints.   Patient will be started on Keppra for 7 days  Blood pressure control goal below 140 systolic patient was a started on hydralazine and amlodipine with good blood pressure response.  Met with patient daughter Dondra Prader at the bedside discussed discharge planning to acute rehab versus SNF both are agreeable with discharge to acute rehab awaiting case management evaluation for placement.  No aspirin/NSAIDs/anticoagulation.    Procedures/Imaging:   Upon my review: CT Head WO Contrast    Result Date:  10/04/2019   Essentially stable appearance to the hemorrhagic infarct involving the right thalamus with intraventricular extension. No new hemorrhage or hydrocephalus is seen. There is minimal, 1 mm, right to left midline shift. Ida Rogue, MD  10/04/2019 7:53 PM    CT Head WO Contrast    Result Date: 10/04/2019  1.  Acute hemorrhagic infarct in the right thalamus with intraventricular extension. No herniation or hydrocephalus. Findings similar to prior study. 2.  Chronic microvascular changes. Raynelle Dick, MD  10/04/2019 11:13 AM    CT Head without Contrast    Result Date: 10/04/2019  1.  Acute hemorrhagic infarct in the right thalamus with intraventricular extension. No herniation or hydrocephalus. 2.  Chronic microvascular changes. Raynelle Dick, MD  10/04/2019 9:49 AM    XR Chest  AP  Portable    Result Date: 10/04/2019   No acute pulmonary or pleural disease. Sandie Ano, MD  10/04/2019 11:18 AM         Best Practices   Was the patient admitted with either a CHF Exacerbation or Pneumonia?  No     Progress Note/Physical Exam at Discharge     Subjective: Feels better denies chest pain nausea vomiting or abdominal pain still feeling generally weak and unstable on his feet.      Vitals:    10/07/19 0402 10/07/19 0555 10/07/19 0756 10/07/19 1152   BP: 133/75 154/84 127/73 136/71   Pulse: 80  79 95   Resp: 16   18   Temp: 98.6 F (37 C)  98.1 F (36.7 C) 98.2 F (36.8 C)   TempSrc: Oral  Oral Oral   SpO2: 99%  100% (!) 86%   Weight:   65.8 kg (145 lb 1 oz)    Height:             General: NAD, AAOx3  HEENT: perrla, eomi, sclera anicteric, OP: Clear, MMM  Neck: supple, FROM, no LAD  Cardiovascular: RRR, no m/r/g  Lungs: CTAB, no w/r/r  Abdomen: soft, +BS, NT/ND, no masses, no g/r  Extremities: no C/C/E  Skin: no rashes or lesions noted  Neuro: CN 2-12 intact; No Focal neurological deficits,        Diagnostics     Labs/Studies Pending at Discharge: No    Last Labs   Recent Labs   Lab 10/07/19  0409 10/06/19  0349 10/05/19  0313   WBC 8.86 7.92 6.74   RBC 6.28* 6.26* 6.03*   Hgb 13.0 13.1 12.7   Hematocrit 42.3 42.6 41.0   MCV 67.4* 68.1* 68.0*   Platelets 366* 192 163       Recent Labs   Lab 10/07/19  0409 10/06/19  0349 10/05/19  0313 10/04/19  0949   Sodium 136 134* 137 137   Potassium 3.9 4.0 3.8 4.0   Chloride 104 103 105 104   CO2 21* 21* 23 23   BUN 10.0 13.0 9.0 11.0   Creatinine 0.9 0.9 0.9 1.0   Glucose 101* 94 95 92   Calcium 9.1 8.8 9.3 9.4   Magnesium  --  1.7 1.6  --        Microbiology Results     Procedure Component Value Units Date/Time    COVID-19 (SARS-COV-2) Verne Carrow Rapid) [[id number]] Collected: 10/04/19 1202    Specimen: Nasopharyngeal Swab from Nasopharynx Updated: 10/04/19 1245     Purpose of COVID testing Screening     SARS-CoV-2 Specimen Source Nasopharyngeal     SARS CoV 2 Overall  Result Negative     Comment: Test performed using the Abbott ID NOW EUA assay.  Please see Fact Sheets for patients and providers located at:  http://www.rice.biz/  This test is for the qualitative detection of SARS-CoV-2  (COVID19) nucleic acid. Viral nucleic acids may persist in vivo,  independent of viability. Detection of viral nucleic acid does  not imply the presence of infectious virus, or that virus  nucleic acid is the cause of clinical symptoms. Negative  results should be treated as presumptive and, if inconsistent  with clinical signs and symptoms or necessary for patient  management, should be tested with an alternative molecular  assay. Negative results do not preclude SARS-CoV-2 infection  and should not be used as the sole basis for patient  management decisions. Invalid results may be due to inhibiting  substances in the specimen and recollection should occur.         Narrative:      o Collect and clearly label specimen type:  o Upper respiratory specimen: One Nasopharyngeal Dry Swab NO  Transport Media.  o Hand deliver to laboratory ASAP  Indication for testing->Extended care facility admission to  semi private room           Patient Instructions   Discharge Diet: cardiac diet     Discharge Activity: as tolerated     Follow Up Appointment:  Follow-up Information     Campbell Lerner, MD Follow up.    Specialties: Neurology, Sleep Medicine, Clinical Neurophysiology, Vascular Neurology  Contact information:  [address] [place]  [address]  [place] [address]  [phone]             your primary care doctor. Schedule an appointment as soon as possible for a visit in 1 week(s).    Why: After discharge follow-up after rehab for follow-up for blood pressure and stroke                  Time spent examining patient, discussing with patient/family regarding hospital course, chart review, reconciling medications and discharge planning: 40 minutes.    Terrace Arabia    3:19  PM 10/07/2019

## 2019-10-07 NOTE — Progress Notes (Signed)
64y/o gentleman with hx of HTN presenting with sudden onset of left sided weakness and slurred speech. 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     CM met with patient at bedside. Patient lives in a 2 story home with 4 steps to enter with his spouse. PTA he was independent with ambulation and ADLs, still driving and working. No home or community services.    Patient with no insurance listed, CM checked with [facility]. Per [facility] patient's [place] Medicaid is no longer active but he does have active Medicare A & B    DCP: SNF vs AR pending PT/OT recs     10/07/19 1301   Patient Type   Within 30 Days of Previous Admission? No   Healthcare Decisions   Interviewed: Patient   Orientation/Decision Making Abilities of Patient Alert and Oriented x3, able to make decisions   Advance Directive Patient does not have advance directive   Prior to admission   Prior level of function Independent with ADLs;Ambulates independently   Type of Residence Private residence   Home Layout Two level;Stairs to enter without rails (add number in comment)  (4 steps to enter, two stories inside)   Have running water, electricity, heat, etc? Yes   Living Arrangements Spouse/significant other   How do you get to your MD appointments? Self, spouse   How do you get your groceries? Self, spouse   Who fixes your meals? Self, spouse   Who does your laundry? Self, spouse   Who picks up your prescriptions? Self, spouse   Dressing Independent   Grooming Independent   Feeding Independent   Bathing Independent   Toileting Independent   Home Care/Community Services None   Discharge Planning   Support Systems Spouse/significant other;Children;Family members   Patient expects to be discharged to: Acute rehab   Anticipated [place] plan discussed with: Same as interviewed   Mode of transportation: Other  (Ambulance)   Does the patient have perscription coverage? No   Consults/Providers   PT Evaluation  Needed 1   OT Evalulation Needed 1   SLP Evaluation Needed 1   Outcome Palliative Care Screen Screened, met criteria for intervention   Correct PCP listed in Epic? No (comment)  (No PCP)   Family and PCP   PCP on file was verified as the current PCP? Patient/family states they do not have a PCP   In case you are admitted, would like family notified? Yes   Name of family member to be notified Chestine Spore, spouse   In case you are admitted, would like your PCP notified? No   Important Message from [facility] Notice   Patient received 1st IMM Letter? No

## 2019-10-07 NOTE — Discharge Instr - AVS First Page (Addendum)
[facility] DISCHARGE INSTRUCTIONS     Date of Admission: 10/04/2019    Date of Discharge: 10/07/2019    Discharge Physician: Jolyn Lent    Dear Denna Haggard Sr.,     Thank you for choosing [facility] for your emergency care needs. We strive to provide EXCELLENT care to you and your family.     In an effort to explain clearly why you were here in the hospital, I've written a very brief summary. I hope that you find it useful. Other details including formal diagnosis, medication changes, follow up appointment recommendations, and access to MyChart for formal medical records can be found in this packet.       You were admitted for Acute CVA (cerebrovascular accident) for which you were found to have bleeding in the brain and likely vascular etiology for your stroke and you need better control of her blood pressure.     If you develop new weakness, slurred speech, facial asymmetry, numbness, these are signs of a new stroke and you need immediate medical attention call 911.    If you develop chest pain or difficulty breathing please come back to the hospital.      Make sure to follow up with our [facility] or PCP None, MD your primary care doctor for follow-up. I cannot stress the importance of follow up enough.    Make sure to bring the following to your doctors appointments:  Medications in their original bottles  Glucometer/blood sugar log (if diabetic)   Weight log (if you have heart failure)    If you are unable to obtain an appointment, unable to obtain newly prescribed medications, or are unclear about any of your discharge instructions please contact me at [phone] (M-F, 8am-3pm) or weekends and after hours via the hospital operator [phone]) [phone], the hospital case manager, or your primary care physician.    Finally, as your discharging physician, you may be receiving a survey which is regarding my care. I would greatly value and appreciate your feedback as I  strive for excellence.     Respectfully yours,    Jolyn Lent

## 2019-10-07 NOTE — PT Progress Note (Signed)
Physical Therapy Note    [facility]  Physical Therapy Treatment    Patient:  Michael ANTOSH Sr.  MRN#:  [id number]  Unit:  [facility]  Room/Bed:  O1308/M5784-O    Time of treatment:  Time Calculation  PT Received On: 10/07/19  Start Time: 1347  Stop Time: 1425  Time Calculation (min): 38 min          Chart Review and Collaboration with Care Team: 8 minutes, not included in above time.    PT Visit Number: 2    Precautions:   Precautions  Weight Bearing Status: no restrictions  Aspiration Precautions: see SLP recommendations  Other Precautions: falls, HOB >30*, SBP <140     Personal Protective Equipment (PPE)  gloves, procedure mask and eye shield/covering    Updated X-Rays/Tests/Labs:  Lab Results   Component Value Date/Time    HGB 13.0 10/07/2019 04:09 AM    HCT 42.3 10/07/2019 04:09 AM    K 3.9 10/07/2019 04:09 AM    NA 136 10/07/2019 04:09 AM    INR 1.2 (H) 10/06/2019 03:49 AM    TROPI 0.01 10/04/2019 09:49 AM    TROPI 0.01 04/14/2013 11:21 PM     All imaging reviewed, please see chart for details.    Subjective:  "I just feel like my body doesn't know what to do."     Patient Goal: To go home     Pain Assessment  Pain Assessment: Numeric Scale (0-10)  Pain Score: 3-mild pain  Pain Location: Head  Pain Intervention(s): Ambulation/increased activity;Repositioned         Patient's medical condition is appropriate for Physical Therapy intervention at this time.  Patient is agreeable to participation in the therapy session. Patient is unable to indicate agreement for the therapy session but is able to participate in the selected activities.    Objective:  Observation of Patient/Vital Signs:  VSS t/o session. No complaints of dizziness, but feels "strange" Reports slight HA, RN aware     BP: 133/80 @ start of session, 154/80 @ end of session      Pt. Anxious and fearful of falling t/o session. Daughter present in room. Pt. Very motivated to get better as he would like to be able to return  to his PLOF.       Cognition/Neuro Status  Arousal/Alertness: Appropriate responses to stimuli  Attention Span: Appears intact  Orientation Level: Oriented X4  Memory: Appears intact  Following Commands: Follows one step commands consistently  Safety Awareness: minimal verbal instruction  Insights: Fully aware of deficits;Educated in safety awareness  Problem Solving: minimal assistance  Comments: Pt. may have perceptual deficit requires further testing  Behavior: anxious;attentive;cooperative  Motor Planning: decreased initiation;decreased processing speed    Functional Mobility  Rolling: Stand by Assist  Supine to Sit: Contact Guard Assist(@ trunk)  Scooting to HOB: (side stepping with/without RW )  Scooting to EOB: Stand by Assist(VC for equal positioning )  Sit to Supine: Stand by Assist  Sit to Stand: Moderate Assist;Minimal Assist(Mod A>Min A with/without RW, retropulsion noted/reliance on bed for support)  Mass practice without AD:   --focusing on anterior trunk lean  --utilizing hands of knee for increased sensation/proprioception w/ improved anterior trunk lean   --emphasize squeezing of glutes upon standing   Stand to Sit: Minimal Assist     Locomotion  Ambulation: Moderate Assist;Minimal Assist;with front-wheeled walker(mod/min A @ times )  Pattern: decreased cadence;decreased step length;Step to;Wide BOS(dec R stance time,  L knee instability )  Distance Walked (ft) (Step 6,7): 5 Feet    Neuro Re-Ed  Sitting Balance: contact guard assist;with support;with instruction(multi-directional reaching activities, error augmentation utilized to encourage pt. To correct L lateral LOB, able to correct indp. After practice and VC)  Standing Balance: moderate assist;standing weight shifting all planes;with instruction;with support    Educated the patient to role of physical therapy, plan of care, goals of therapy and HEP, safety with mobility and ADLs, discharge instructions, home safety with verbalized  understanding.    Patient left in bed (2/2 reports of fatigue) with alarm and all other medical equipment in place and call bell and all personal items/needs within reach.  RN notified of session outcome.    Assessment:  The pt. Tolerated therapy well today and performed mass practice sit>stand. The pt. Demonstrated improved ability to perform sit>stand at end of session with use of RW and mod/min A. The pt. Was noted to have mild perceptual deficits to the L, but will require ongoing assessment. Pt. extremely motivcated to participate t/o session and reported that the would like to engage in intensive daily therapy to return to PL OF. The pt. will continue to benefit from skilled PT to further improve functional independence to work towards goals.    PT frequency is being increased to 3-4x/week due to change in discharge destination requiring corresponding frequency update.    Patient anticipated to benefit from and tolerate 3 hours/day, 5 days/week.      PMP Activity: Step 6 - Walks in Room  Distance Walked (ft) (Step 6,7): 5 Feet    Plan:  Treatment/Interventions: Equipment eval/education, Patient/family training, Endurance training, LE strengthening/ROM, Functional transfer training, Neuromuscular re-education, Stair training, Gait training, Exercise, Bed mobility      PT Frequency: 3-4x/wk   Adapt plan with update d/c rec to ARF 2/2 requiring increased assistance for OOB mobility and would benefit from intensive skilled rehab.    Goals:  Goals  Goal Formulation: With patient  Time for Goal Acheivement: By time of discharge  Goals: Select goal  Pt Will Go Supine To Sit: modified independent, to maximize functional mobility and independence(CGA for safety )  Pt Will Perform Sit To Supine: modified independent, to maximize functional mobility and independence(CGA for safety )  Pt Will Perform Sit to Stand: modified independent, to maximize functional mobility and independence(mod A with RW, block LLE )  Pt Will  Transfer Bed/Chair: with rolling walker, modified independent, to maximize functional mobility and independence, Partly met(SPT mod A with RW )  Pt Will Ambulate: 51-100 feet, with rolling walker, modified independent, to maximize functional mobility and independence, Partly met(43ft with RW and mod A, block LLE @ times)  Pt Will Go Up / Down Stairs: 1 flight, with supervision, With rail, to maximize functional mobility and independence, Not met(n/t )        DME Recommended for Discharge: Front wheel walker  Discharge Recommendation: Acute Rehab(if unable: SNF)    Rolla Plate, PT, DPT   Physical Therapist  Physical Medicine & Rehabilitation  Spectra [phone]  M-W &Fr 8-4:30pm  Thurs 12-8:30pm   10/07/2019 2:40 PM

## 2019-10-07 NOTE — Plan of Care (Signed)
Pt A&Ox4. VSS but elevated SBP (T 98s, HR 80-90s, R 12-20, SBP 150-160s, O2 98% on RA). Lungs CTA but diminished bilaterally. SR with frequent PVCs PACs on tele. NIH score for this shift is 2 for motor leg abilities. Pt denied SOB, CP, dizziness, lightheadedness, and N/V. Pt c/o generalized pain especially on his arms and back. Pt was irritable and agitated because pt stated he couldn't get to sleep. Scheduled Keppra and PRN melatonin were given. Pt attempted to get out of the bed several times during this shift. Pt finally appeared to be sleeping after the Keppra and melatonin given. Pt pulled out the external cath. Pt voids through the urinal. Neuro check done every 4 hours. No acute changes noted. Call bell and belongings within reach. Bed in the lowest position.     Problem: Moderate/High Fall Risk Score >5  Goal: Patient will remain free of falls  Outcome: Progressing  Flowsheets (Taken 10/06/2019 2000)  High (Greater than 13):   HIGH-Visual cue at entrance to patient's room   HIGH-Bed alarm on at all times while patient in bed   HIGH-Apply yellow "Fall Risk" arm band     Problem: Safety  Goal: Patient will be free from injury during hospitalization  Outcome: Progressing  Flowsheets (Taken 10/07/2019 0434)  Patient will be free from injury during hospitalization:   Assess patient's risk for falls and implement fall prevention plan of care per policy   Provide and maintain safe environment   Include patient/ family/ care giver in decisions related to safety   Hourly rounding   Assess for patients risk for elopement and implement Elopement Risk Plan per policy     Problem: Pain  Goal: Pain at adequate level as identified by patient  Outcome: Progressing  Flowsheets (Taken 10/07/2019 0434)  Pain at adequate level as identified by patient:   Identify patient comfort function goal   Assess pain on admission, during daily assessment and/or before any "as needed" intervention(s)   Reassess pain within 30-60 minutes of any  procedure/intervention, per Pain Assessment, Intervention, Reassessment (AIR) Cycle   Evaluate if patient comfort function goal is met   Evaluate patient's satisfaction with pain management progress   Offer non-pharmacological pain management interventions     Problem: Every Day - Stroke  Goal: Neurological status is stable or improving  Outcome: Progressing  Flowsheets (Taken 10/07/2019 0434)  Neurological status is stable or improving:   Monitor/assess/document neurological assessment (Stroke: every 4 hours)   Monitor/assess NIH Stroke Scale   Re-assess NIH Stroke Scale for any change in status   Perform CAM Assessment  Goal: Stable vital signs and fluid balance  Outcome: Progressing  Flowsheets (Taken 10/07/2019 0434)  Stable vital signs and fluid balance:   Position patient for maximum circulation/cardiac output   Monitor and assess vitals every 4 hours or as ordered and hemodynamic parameters   Monitor intake and output. Notify LIP if urine output is < 30 mL/hour.   Encourage oral fluid intake  Goal: Neurovascular status is stable or improving  Outcome: Progressing  Flowsheets (Taken 10/07/2019 0434)  Neurovascular status is stable or improving: Monitor/assess neurovascular status (pulses, capillary refill, pain, paresthesia, presence of edema)

## 2019-10-07 NOTE — Plan of Care (Signed)
Discharge Recommendation: Acute Rehab(if unable: SNF)  DME Recommended for Discharge: Front wheel walker    Is an Occupational Therapy Evaluation Indicated at this time? This patient is already on OT caseload.     Treatment/Interventions: Engineer, production, Cabin crew, Endurance training, LE strengthening/ROM, Functional transfer training, Neuromuscular re-education, Stair training, Gait training, Exercise, Bed mobility  PT Frequency: 3-4x/wk     PMP Activity: Step 6 - Walks in Room  Distance Walked (ft) (Step 6,7): 5 Feet  (Please See Therapy Evaluation for device and assistance level needed)    Goals:   Goals  Goal Formulation: With patient  Time for Goal Acheivement: By time of discharge  Goals: Select goal  Pt Will Go Supine To Sit: modified independent, to maximize functional mobility and independence(CGA for safety )  Pt Will Perform Sit To Supine: modified independent, to maximize functional mobility and independence(CGA for safety )  Pt Will Perform Sit to Stand: modified independent, to maximize functional mobility and independence(mod A with RW, block LLE )  Pt Will Transfer Bed/Chair: with rolling walker, modified independent, to maximize functional mobility and independence, Partly met(SPT mod A with RW )  Pt Will Ambulate: 51-100 feet, with rolling walker, modified independent, to maximize functional mobility and independence, Partly met(33ft with RW and mod A, block LLE @ times)  Pt Will Go Up / Down Stairs: 1 flight, with supervision, With rail, to maximize functional mobility and independence, Not met(n/t )

## 2019-10-07 NOTE — Plan of Care (Signed)
Pt had impulsive behaviors this shift, Out of bed without assistance x1, pt unaware of limitations, unsteady gait ,re-educated on fall prevention, fall mat in place,bed alarm on, bed in lowest position, call light with reach, verbalizes understanding on uses of call light, and fall precaution measures.

## 2019-10-07 NOTE — Progress Notes (Signed)
[facility] Consultation Note                                       Date Time: 10/07/19 9:03 AM  Patient Name: Michael White, Michael SR.  Requesting Physician: Jolyn Lent, MD  Date of Admission: 10/04/2019    CC / Reason for Consultation: left-sided weakness           Assessment:     63y/o gentleman with hx of HTN presenting with sudden onset of left sided weakness and slurred speech. 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     Suspect hypertensive etiology of his ICH.       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    Borderline hypertension     on meds in past; BP normal without meds recently          Past Surgical Hx:     Past Surgical History:   Procedure Laterality Date    CARDIAC ABLATION  04/15/2013    ablation of slow pathway for AVNRT        Family Medical History:      Family History   Problem Relation Age of Onset    Heart disease Father         unknown what kind but not MI       Social Hx     Social History     Socioeconomic History    Marital status: Legally Separated     Spouse name: Not on file    Number of children: Not on file    Years of education: Not on file    Highest education level: Not on file   Occupational History    Not on file   Tobacco Use    Smoking status: Former Smoker     Packs/day: 0.50     Years: 14.00     Pack years: 7.00     Types: Cigarettes     Quit date:  08/28/2012     Years since quitting: 7.1    Smokeless tobacco: Never Used   Substance and Sexual Activity    Alcohol use: Yes     Comment: occ beer some weekends    Drug use: No    Sexual activity: Not on file   Other Topics Concern    Not on file   Social History Narrative    Not on file     Social Determinants of Health     Financial Resource Strain:     Difficulty of Paying Living Expenses:    Food Insecurity:     Worried About Radiation protection practitioner of Food in the Last Year:     Ran Out of Food in the Last Year:  Transportation Needs:     Freight forwarder (Medical):     Lack of Transportation (Non-Medical):    Physical Activity:     Days of Exercise per Week:     Minutes of Exercise per Session:    Stress:     Feeling of Stress :    Social Connections:     Frequency of Communication with Friends and Family:     Frequency of Social Gatherings with Friends and Family:     Attends Religious Services:     Active Member of Clubs or Organizations:     Attends Engineer, structural:     Marital Status:    Intimate Partner Violence:     Fear of Current or Ex-Partner:     Emotionally Abused:     Physically Abused:     Sexually Abused:        Meds     Home :   Prior to Admission medications    Medication Sig Start Date End Date Taking? Authorizing Provider   acetaminophen (TYLENOL) 325 MG tablet Take 2 tablets (650 mg total) by mouth every 4 (four) hours as needed for Pain 05/24/18   Johnsie Cancel, Stacy L, DO   cyclobenzaprine (FLEXERIL) 5 MG tablet Take 1 tablet (5 mg total) by mouth 2 (two) times daily as needed for Muscle spasms 05/24/18   Johnsie Cancel, Stacy L, DO   ibuprofen (ADVIL,MOTRIN) 800 MG tablet Take 1 tablet (800 mg total) by mouth every 8 (eight) hours as needed for Pain. 09/23/15   Donny Pique, MD      Inpatient :       Allergies    Patient has no known allergies.      Review of Systems     All other systems were reviewed and are negative except for that mentioned in the HPI     Physical Exam:   Temp:  [98.1 F (36.7 C)-99.9 F (37.7 C)] 98.1 F (36.7 C)  Heart Rate:  [79-112] 79  Resp Rate:  [12-28] 16  BP: (116-164)/(58-85) 127/73       General: in no acute distress. Cooperative with the exam  Neck: supple  CVS: warm and well perfused  Resp: no respiratory distress  Extremities: no pedal edema, no rashes noted    Neurological Examination:  MSE: slightly lethargic but easily aroused by voice, oriented x 3, speech fluent without word-finding difficulty or paraphasic errors, follows simple commands  CN: Pupils 4-->39mm b/l, no gaze deviation, EOMI,  reports sensation in V1-V3 intact to LT/temp, smile symmetric, mild dysarthria.  Motor: 5/5 strength in RUE and RLE. LUE proximal 5-/5, distal 4+/5. LLE proximal and distal 5-/5.  Sensory: Intact and symmetric to LT x4 extremities.        Labs:     Results     Procedure Component Value Units Date/Time    Basic Metabolic Panel [[id number]]  (Abnormal) Collected: 10/07/19 0409    Specimen: Blood Updated: 10/07/19 0526     Glucose 101 mg/dL      BUN 40.9 mg/dL      Creatinine 0.9 mg/dL      Calcium 9.1 mg/dL      Sodium 811 mEq/L      Potassium 3.9 mEq/L      Chloride 104 mEq/L      CO2 21 mEq/L      Anion Gap 11.0    Hemolysis index [[id number]] Collected: 10/07/19 0409     Updated: 10/07/19 2130  Hemolysis Index 4    GFR [[id number]] Collected: 10/07/19 0409     Updated: 10/07/19 0526     EGFR >60.0    CBC and differential [[id number]]  (Abnormal) Collected: 10/07/19 0409    Specimen: Blood Updated: 10/07/19 0500     WBC 8.86 x10 3/uL      Hgb 13.0 g/dL      Hematocrit 86.5 %      Platelets 366 x10 3/uL      RBC 6.28 x10 6/uL      MCV 67.4 fL      MCH 20.7 pg      MCHC 30.7 g/dL      RDW 22 %      MPV 9.8 fL      Neutrophils 56.6 %      Lymphocytes Automated 29.3 %      Monocytes 12.5 %      Eosinophils Automated 1.1 %      Basophils Automated 0.3 %      Immature Granulocytes 0.2 %      Nucleated RBC 0.0 /100 WBC      Neutrophils Absolute 5.00  x10 3/uL      Lymphocytes Absolute Automated 2.60 x10 3/uL      Monocytes Absolute Automated 1.11 x10 3/uL      Eosinophils Absolute Automated 0.10 x10 3/uL      Basophils Absolute Automated 0.03 x10 3/uL      Immature Granulocytes Absolute 0.02 x10 3/uL      Absolute NRBC 0.00 x10 3/uL           Rads:     Results for orders placed or performed during the hospital encounter of 09/23/15   CT Head WO Contrast    Narrative    History: Trauma.    Comparisons: 04/14/2013.    Technique: Axial CT brain obtained from vertex to skull base without  contrast. Axial CT facial bones with coronal and sagittal  reconstructions.    A combination of automatic exposure control, adjustment of the mA and/or  kV according to patient size and/or use of iterative reconstruction  technique was utilized.    Findings:  Gray-white matter differentiation preserved.        Basal ganglia, thalami, midbrain, pons unremarkable.    No mass, mass effect, or midline shift. No intra-axial or extra-axial  hemorrhage or collection.    Ventricles, sulci, cisterns age-appropriate in size and configuration.    Calvarium appears intact.         No maxillofacial bone fractures or bony dehiscence.     Orbits and globes intact.     Frontal sinuses, sphenoid sinuses, maxillary sinuses, and ethmoid air  cells clear. Mastoid air cells, middle ear cavities clear.    Soft tissue unremarkable.      Impression       No acute intracranial or maxillofacial abnormality.    Adaline Sill, MD   09/23/2015 11:04 PM             Elspeth Cho  [facility] Neurology  Spectralink [phone]

## 2019-10-07 NOTE — Plan of Care (Signed)
Alert, pt c/o being "too wobbly to stand on that scale", bedscale weight performed,  ate 25% meals, 100% strawberry ensure x2, c/o poor appetite, family visited, eager to go to "acute rehab and I want to be near my home"    Sr on tele, daughter visited and updated on plan of care along with patient        Problem: Every Day - Stroke  Goal: Nutritional intake is adequate  Flowsheets (Taken 10/07/2019 1800)  Nutritional intake is adequate:   Monitor daily weights   Assist patient with meals/food selection   Allow adequate time for meals   Consult/collaborate with Clinical Nutritionist   Include patient/patient care companion in decisions related to nutrition   Assess anorexia, appetite, and amount of meal/food tolerated

## 2019-10-08 ENCOUNTER — Inpatient Hospital Stay
Admission: RE | Admit: 2019-10-08 | Discharge: 2019-10-21 | DRG: 880 | Disposition: A | Discharge status: Skilled Nursing Facility | Payer: Medicare Other | Source: Other Acute Inpatient Hospital | Attending: Physical Medicine & Rehabilitation | Admitting: Physical Medicine & Rehabilitation

## 2019-10-08 DIAGNOSIS — I69198 Other sequelae of nontraumatic intracerebral hemorrhage: Secondary | ICD-10-CM

## 2019-10-08 DIAGNOSIS — Z635 Disruption of family by separation and divorce: Secondary | ICD-10-CM

## 2019-10-08 DIAGNOSIS — R443 Hallucinations, unspecified: Principal | ICD-10-CM | POA: Diagnosis not present

## 2019-10-08 DIAGNOSIS — Z8249 Family history of ischemic heart disease and other diseases of the circulatory system: Secondary | ICD-10-CM

## 2019-10-08 DIAGNOSIS — I10 Essential (primary) hypertension: Secondary | ICD-10-CM | POA: Diagnosis present

## 2019-10-08 DIAGNOSIS — I619 Nontraumatic intracerebral hemorrhage, unspecified: Secondary | ICD-10-CM | POA: Diagnosis present

## 2019-10-08 DIAGNOSIS — M75111 Incomplete rotator cuff tear or rupture of right shoulder, not specified as traumatic: Secondary | ICD-10-CM | POA: Diagnosis present

## 2019-10-08 DIAGNOSIS — B351 Tinea unguium: Secondary | ICD-10-CM | POA: Diagnosis not present

## 2019-10-08 DIAGNOSIS — R252 Cramp and spasm: Secondary | ICD-10-CM | POA: Diagnosis present

## 2019-10-08 DIAGNOSIS — Z87891 Personal history of nicotine dependence: Secondary | ICD-10-CM

## 2019-10-08 DIAGNOSIS — I6381 Other cerebral infarction due to occlusion or stenosis of small artery: Secondary | ICD-10-CM | POA: Insufficient documentation

## 2019-10-08 DIAGNOSIS — I69391 Dysphagia following cerebral infarction: Secondary | ICD-10-CM

## 2019-10-08 DIAGNOSIS — F0631 Mood disorder due to known physiological condition with depressive features: Secondary | ICD-10-CM | POA: Diagnosis present

## 2019-10-08 DIAGNOSIS — E559 Vitamin D deficiency, unspecified: Secondary | ICD-10-CM | POA: Diagnosis not present

## 2019-10-08 DIAGNOSIS — R2689 Other abnormalities of gait and mobility: Secondary | ICD-10-CM | POA: Diagnosis present

## 2019-10-08 DIAGNOSIS — L859 Epidermal thickening, unspecified: Secondary | ICD-10-CM | POA: Diagnosis not present

## 2019-10-08 DIAGNOSIS — Z79899 Other long term (current) drug therapy: Secondary | ICD-10-CM

## 2019-10-08 DIAGNOSIS — R1310 Dysphagia, unspecified: Secondary | ICD-10-CM | POA: Diagnosis present

## 2019-10-08 LAB — COMPREHENSIVE METABOLIC PANEL
ALT: 14 U/L (ref 0–55)
AST (SGOT): 11 U/L (ref 5–34)
Albumin/Globulin Ratio: 1.2 (ref 0.9–2.2)
Albumin: 3.7 g/dL (ref 3.5–5.0)
Alkaline Phosphatase: 82 U/L (ref 38–106)
Anion Gap: 9 (ref 5.0–15.0)
BUN: 14 mg/dL (ref 9–28)
Bilirubin, Total: 0.8 mg/dL (ref 0.2–1.2)
CO2: 25 mEq/L (ref 22–29)
Calcium: 9 mg/dL (ref 8.5–10.5)
Chloride: 101 mEq/L (ref 100–111)
Creatinine: 0.9 mg/dL (ref 0.7–1.3)
Globulin: 3.1 g/dL (ref 2.0–3.6)
Glucose: 119 mg/dL — ABNORMAL HIGH (ref 70–100)
Potassium: 4 mEq/L (ref 3.5–5.1)
Protein, Total: 6.8 g/dL (ref 6.0–8.3)
Sodium: 135 mEq/L — ABNORMAL LOW (ref 136–145)

## 2019-10-08 LAB — GFR
EGFR: >60
EGFR: >60

## 2019-10-08 LAB — BASIC METABOLIC PANEL
Anion Gap: 13 (ref 5.0–15.0)
BUN: 13 mg/dL (ref 9.0–28.0)
CO2: 18 mEq/L — ABNORMAL LOW (ref 22–29)
Calcium: 8.8 mg/dL (ref 8.5–10.5)
Chloride: 103 mEq/L (ref 100–111)
Creatinine: 0.8 mg/dL (ref 0.7–1.3)
Glucose: 86 mg/dL (ref 70–100)
Potassium: 4.6 mEq/L (ref 3.5–5.1)
Sodium: 134 mEq/L — ABNORMAL LOW (ref 136–145)

## 2019-10-08 LAB — CBC AND DIFFERENTIAL
Absolute NRBC: 0 10*3/uL (ref 0.00–0.00)
Basophils Absolute Automated: 0.03 10*3/uL (ref 0.00–0.08)
Basophils Automated: 0.4 %
Eosinophils Absolute Automated: 0.16 10*3/uL (ref 0.00–0.44)
Eosinophils Automated: 2 %
Hematocrit: 43.1 % (ref 37.6–49.6)
Hgb: 13.3 g/dL (ref 12.5–17.1)
Immature Granulocytes Absolute: 0.02 10*3/uL (ref 0.00–0.07)
Immature Granulocytes: 0.2 %
Lymphocytes Absolute Automated: 2.33 10*3/uL (ref 0.42–3.22)
Lymphocytes Automated: 29.1 %
MCH: 20.8 pg — ABNORMAL LOW (ref 25.1–33.5)
MCHC: 30.9 g/dL — ABNORMAL LOW (ref 31.5–35.8)
MCV: 67.6 fL — ABNORMAL LOW (ref 78.0–96.0)
MPV: 9.6 fL (ref 8.9–12.5)
Monocytes Absolute Automated: 1.07 10*3/uL — ABNORMAL HIGH (ref 0.21–0.85)
Monocytes: 13.4 %
Neutrophils Absolute: 4.4 10*3/uL (ref 1.10–6.33)
Neutrophils: 54.9 %
Nucleated RBC: 0 /100 WBC (ref 0.0–0.0)
Platelets: 333 10*3/uL (ref 142–346)
RBC: 6.38 10*6/uL — ABNORMAL HIGH (ref 4.20–5.90)
RDW: 22 % — ABNORMAL HIGH (ref 11–15)
WBC: 8.01 10*3/uL (ref 3.10–9.50)

## 2019-10-08 LAB — HEMOLYSIS INDEX: Hemolysis Index: 70 — ABNORMAL HIGH (ref 0–18)

## 2019-10-08 MED ORDER — ONDANSETRON HCL 8 MG PO TABS
4.0000 mg | ORAL_TABLET | Freq: Three times a day (TID) | ORAL | Status: DC | PRN
Start: 2019-10-08 — End: 2019-10-21

## 2019-10-08 MED ORDER — AMLODIPINE BESYLATE 5 MG PO TABS
5.0000 mg | ORAL_TABLET | Freq: Every day | ORAL | Status: DC
Start: 2019-10-09 — End: 2019-10-21
  Administered 2019-10-09 – 2019-10-21 (×13): 5 mg via ORAL
  Filled 2019-10-08 (×13): qty 1

## 2019-10-08 MED ORDER — CYCLOBENZAPRINE HCL 10 MG PO TABS
5.0000 mg | ORAL_TABLET | Freq: Three times a day (TID) | ORAL | Status: DC
Start: 2019-10-08 — End: 2019-10-10
  Administered 2019-10-08 – 2019-10-10 (×5): 5 mg via ORAL
  Filled 2019-10-08 (×5): qty 1

## 2019-10-08 MED ORDER — LEVETIRACETAM 250 MG PO TABS
750.00 mg | ORAL_TABLET | Freq: Two times a day (BID) | ORAL | Status: AC
Start: 2019-10-08 — End: 2019-10-15
  Administered 2019-10-08 – 2019-10-15 (×14): 750 mg via ORAL
  Filled 2019-10-08 (×15): qty 1

## 2019-10-08 MED ORDER — HYDRALAZINE HCL 25 MG PO TABS
50.0000 mg | ORAL_TABLET | Freq: Three times a day (TID) | ORAL | Status: DC
Start: 2019-10-08 — End: 2019-10-21
  Administered 2019-10-08 – 2019-10-21 (×37): 50 mg via ORAL
  Filled 2019-10-08 (×39): qty 2

## 2019-10-08 MED ORDER — TRAZODONE HCL 50 MG PO TABS
50.0000 mg | ORAL_TABLET | Freq: Every evening | ORAL | Status: DC
Start: 2019-10-08 — End: 2019-10-15
  Administered 2019-10-08 – 2019-10-14 (×7): 50 mg via ORAL
  Filled 2019-10-08 (×7): qty 1

## 2019-10-08 MED ORDER — ACETAMINOPHEN 325 MG PO TABS
650.0000 mg | ORAL_TABLET | Freq: Four times a day (QID) | ORAL | Status: DC | PRN
Start: 2019-10-08 — End: 2019-10-21
  Administered 2019-10-09 – 2019-10-11 (×3): 650 mg via ORAL
  Filled 2019-10-08 (×3): qty 2

## 2019-10-08 NOTE — H&P (Signed)
IRF Post-Admission Assessment  History and Physical    Reason for admission: Deficits in mobility and ADLs secondary to R thalamic hemorrhage int he setting of hypertensive urgency    Date of admission: 10/08/2019    History of present illness:   Patient is a 63y/o right hand dominant male with PMHx HTN who was in his normal state of health until 10/04/19 when he presented to the hospital after being found confused by his coworker.  CT of the head done in emergency room showed acute hemorrhagic infarction in the right  thalamus.  He was admitted to the ICU, placed on Cardene drip for hypertensive urgency. Neurosurgery and neurology were consulted. No intervention recommended at this time. Etiology of ICH is suspected to be due to hypertension (highest BP at admission was 201/127) Weaned off Cardene drip.  Mental status has improved. Patient will be started on Keppra for 7 days Blood pressure control goal below 140 systolic patient was a started on hydralazine and amlodipine with good blood pressure  Response.  Patient was seen by PT/OT/SLP and deemed appropriate for transfer to [facility] on 10/08/19.     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    Borderline hypertension     on meds in past; BP normal without meds recently       Past Surgical History:   Past Surgical History:   Procedure Laterality Date    CARDIAC ABLATION  04/15/2013    ablation of slow pathway for AVNRT       Allergies: No Known Allergies        Family History:   Family History   Problem Relation Age of Onset    Heart disease Father         unknown what kind but not MI       Functional history and social history:  Prior Functional Status: Independent with mobility and ADLs    Admission Functional Status:  Rolling: Stand by Assist  Supine to Sit: Contact Guard Assist(@  trunk)  Scooting to HOB: (side stepping with/without RW )  Scooting to EOB: Stand by Assist(VC for equal positioning )  Sit to Supine: Stand by Assist  Sit to Stand: Moderate Assist;Minimal Assist(Mod A>Min A with/without RW,  retropulsion noted/reliance on bed for support)  Mass practice without AD:  --focusing on anterior trunk lean  --utilizing hands of knee for increased sensation/proprioception w/ improved  anterior trunk lean  --emphasize squeezing of glutes upon standing  Stand to Sit: Minimal Assist  Locomotion  Ambulation: Moderate Assist;Minimal Assist;with front-wheeled walker(mod/min A @  times )  Pattern: decreased cadence;decreased step length;Step to;Wide BOS(dec R stance  time, L knee instability )  Distance Walked (ft) (Step 6,7): 5 Feet  Activities of Daily Living: 10/08/19 per nurse  Eating set up  Mod assist for dressing  Cognition: 10/08/19 Alert/Oriented. x4 Follows commands. No communication deficits  noted.  Communication: 10/08/19 Mild dysarthria  Swallowing:  10/08/19  Mechanical soft diet    SocHx: Tobacco-Quit September 13, 2019. Smoked .75 PPD for 12 years  Alcohol-Quit September 13, 2019.  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  Lives with his wife, stepdaughter, son-in-law and their 6 children. 2 STE and full flight within. Wife has Alzheimer's,  and patient's stepdaughter acts as her primary caregiver.    He has 2 children who live in [facility]    Review of systems:  Pertinent positives are:  Last BM 5/6 after 4 days.  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    Medications:   Medications:   Medications reconciled by me:  Current Facility-Administered Medications   Medication Dose Route Frequency    [START ON 10/09/2019] amLODIPine  5 mg Oral Daily    cyclobenzaprine  5 mg Oral TID    hydrALAZINE  50 mg  Oral Q8H    levETIRAcetam  750 mg Oral Q12H SCH    traZODone  50 mg Oral QHS     Physical Examination:   BP 149/87    Pulse 73    Temp 98.1 F (36.7 C) (Oral)    Resp 18    Ht 1.803 m (5\' 11" )    Wt 65.5 kg (144 lb 6.4 oz)    SpO2 99%    BMI 20.14 kg/m   Estimated body mass index is 20.14 kg/m as calculated from the following:    Height as of this encounter: 1.803 m (5\' 11" ).    Weight as of this encounter: 65.5 kg (144 lb 6.4 oz).  General: age appropriate, NAD, well nourished  HEENT: Head NC/AT, neck supple, no tenderness, sclera non-icteric, mouth mucosa pink and moist  Heart/Thorax: RRR, no m/r/g  Lungs: CTAB, no wheezing, rhonci, or rales  Abdomen: soft, non-tender, bowel sounds present  MSK: Bilateral shoulder stiffness and pain with ROM. Skin intact  Neurological: AAOx3 to person, place, situation. Not oriented to date. , EOMI, PERRLA, CNII-XII grossly intact. 3/3 word recall, reflexes +2 and symmetric, Down going plantar response on Babinski testing. Sensation intact to light touch  +Fasciculations to the biceps bilaterally, difficulty bending fingers of the left hand to make a fist (unsure if this is due to apraxia vs. Myotonic).    Manual muscle strength testing:  Muscle group Left Right   Shoulder abductors NT NT   Elbow flexors 5 5   Elbow extensors 5 5   Wrist extensors 5 5   Finger flexors 4- with delayed response/rigidity 5   Finger Abductors Unable to perform 5   Hip flexors 5 5   Knee flexors 5 5   Knee extensors 5 5   Ankle dorsiflexors 5 5   Ankle plantarflexors 5 5   EHL 5 5      Lymphatics: no lymphadenopathy  Psych: pleasant and cooperative        INPATIENT REHABILITATION FACILITY - PATIENT ASSESSMENT INSTRUMENT QUALITY INDICATORS       Section C.  Cognitive Patterns.    C0100. Should Brief Interview for Mental Status (C0200-C0500) be conducted? (3-day assessment period) Attempt to conduct interview with all patients.      0. No (patient is rarely/never understood) Skip to C0900.  Memory/Recall Ability   1. Yes Continue to C0200. Repetition of Three Words   Yes     Brief Interview for Mental Status (BIMS)    C0200. Repetition of Three Words      Ask patient: I am going to say three words for you to remember. Please repeat the words after I have said all three. The words are: sock, blue and bed. Now tell me the three words.  Number of words repeated by patient after first attempt:   3. Three   2. Two   1. One   0. None     Three    After the patient's first attempt say: I will repeat each of the three words with a cue and ask you about them later: sock, something to wear; blue, a color; bed, a piece of furniture. You may repeat the words up to two more times             Brief Interview for Mental Status (BIMS) - Continued    C0300. Temporal Orientation: Year, Month, Day      A. Ask patient: Please tell me what year it is right now. Patient's answer is:   3. Correct   2. Missed by 1 year   1. Missed by 2 to 5 years   0. Missed by more than 5 years or no answer     Correct       B. Ask patient: What month are we in right now? Patient's answer is:   2. Accurate within 5 days   1. Missed by 6 days to 1 month  0. Missed by more than 1 month or no answer     Accurate within 5 days       C. Ask patient: What day of the week is today? Patient's answer is:   1. Correct   0. Incorrect or no answer     Incorrect or no answer     C0400. Recall    Ask patient: Let's go back to the first question. What were those three words that I asked you to repeat? If unable to remember a word, give cue (i.e., something to wear; a color; a piece of furniture) for that word.     A. Recalls sock?   2. Yes, no cue required  1. Yes, after cueing ("something to wear")  0. No, could not recall     Yes, no cue required     B. Recalls blue?   2. Yes, no cue required   1. Yes, after cueing ("a color")  0. No, could not recall     Yes, no cue required     C. Recalls bed?  2. Yes, no cue required   1. Yes,  after cueing ("a piece of furniture")   0. No, could not recall     Yes, after cueing ("a piece of furniture")   C0500. BIMS Summary Score.      Select "Yes", if the patient was unable to complete the interview.  Select "No", if the patient was able to complete the interview.   No     C0600. Should the Staff Assessment for Mental Status [phone]) be Conducted?      0. No (patient was able to complete Brief Interview for Mental Status)   1. Yes (patient was unable to complete Brief Interview for Mental Status) Continue to C0900. Memory/Recall Ability     No (patient was able to complete Brief Interview for Mental Status)       Staff Assessment for Mental Status.    Do not conduct if Brief Interview for Mental Status (C0200-C0500) was completed.    C0900. Memory/Recall Ability.    Check all that the patient was normally able to recall    A. Current season    B. Location of own room    C. Staff names and faces    E. That he or she is  in a hospital/hospital unit    Z. None of the above were recalled     That he or she is in a hospital/hospital unit       Labs:   No results for input(s): GLUCOSEWHOLE in the last 24 hours.    Recent Labs   Lab 10/08/19  0352 10/07/19  0409 10/06/19  0349 10/05/19  0313   WBC 8.01 8.86 7.92 6.74   Hgb 13.3 13.0 13.1 12.7   Hematocrit 43.1 42.3 42.6 41.0   Platelets 333 366* 192 163        Recent Labs   Lab 10/08/19  0352 10/07/19  0409 10/06/19  0349 10/05/19  0313 10/04/19  0949 10/04/19  0949   Sodium 134* 136 134* 137  More results in Results Review 137   Potassium 4.6 3.9 4.0 3.8  More results in Results Review 4.0   Chloride 103 104 103 105  More results in Results Review 104   CO2 18* 21* 21* 23  More results in Results Review 23   BUN 13.0 10.0 13.0 9.0  More results in Results Review 11.0   Creatinine 0.8 0.9 0.9 0.9  More results in Results Review 1.0   Calcium 8.8 9.1 8.8 9.3  More results in Results Review 9.4   Albumin  --   --  3.6 3.9  --  4.2   Protein, Total  --   --  6.6 7.1   --  7.6   Bilirubin, Total  --   --  1.2 1.7*  --  1.2   Alkaline Phosphatase  --   --  88 95  --  100   ALT  --   --  16 18  --  20   AST (SGOT)  --   --  18 24  --  21   Glucose 86 101* 94 95  More results in Results Review 92   Magnesium  --   --  1.7 1.6  --   --    Phosphorus  --   --  2.9 3.8  --   --    More results in Results Review = values in this interval not displayed.       Recent Labs   Lab 10/06/19  0349 10/05/19  0313 10/04/19  0949   PT INR 1.2* 1.2* 1.2*       Results     ** No results found for the last 24 hours. **               Assessment/Plan:   Patient Active Problem List   Diagnosis    Essential hypertension    Nicotine abuse    Partial tear of right rotator cuff    H/O cardiac radiofrequency ablation    Premature atrial contractions    Premature ventricular contractions    Acute CVA (cerebrovascular accident)    Acute right thalamic hemorrhagic infarct    Hypertensive urgency    Acute metabolic encephalopathy    Cerebrovascular accident (CVA) of right thalamus         The following medical/functional conditions and comorbidities may be complicating factors in their comprehensive rehabilitation program.    #Right thalamic hemorrhage in the setting of hypertensive emergency with deficits in mobility and ADLs: OOB, fall precautions  Keppra for seizure prophylaxis    #Pain Management: Tylenol PRN    #DVT Prophylaxis: SCDs. No pharmacologics due to intracranial bleed    #Bowel/Bladder Management: PVRs to rule  out urinary retention. Use Senna and Colace    #Muscle cramps: Patient states these are chronic for >12 years which he attributes to constantly moving heavy furniture  Flexeril 5 mg TID  Follow up CK levels    #Skin Care: Keep skin clean and dry    #Comorbities (HTN): Continue Norvasc, Hydralazine    #Psych (hx tobacco dependence-Quit Sep 13, 2019): Psychology services for evaluation and treatment as needed for adjustment/coping   Endorses a lot of stressors including financial  and family given his wife has Alzheimer's and he is the bread winner.    #FEN/GI (Dysphagia): Mechanical soft diet, SLP to follow    #Dispo: TBD pending rehab progress    The patient will be admitted for inpatient comprehensive interdisciplinary rehabilitation program (CIR) to address the impairments and medical conditions listed above while assessing equipment needs and compensatory strategies, with services that include PT/OT (60-120 mins/day 5-6xs per week), speech service if ordered (60-120 mins/day 5-6xs per week), 24hr/day rehab nursing, and case management under the direction of a physiatrist.    [x]  Requires an intensive inpatient rehabilitation program with multidisciplinary therapies, rehab nursing, and close physician management.    [x]  Is at risk for the following complications:  Injurious falls, Pneumonia, Urinary tract infection, Venous thromboembolic disease, Hemorrhagic conversion of stroke, Recurrent stroke, Myocardial infarction and Pressure sores        Individualized Plan of Care:    - REHAB: Begin comprehensive and intensive inpatient rehab program, including:  Physical therapy 60-120 min daily, 5-6 times per week  Occupational therapy  60-120 min daily, 5-6 times per week  Speech therapy  60-120 min daily, 5-6 times per week  Therapeutic recreation  Psychology  Case management  Rehabilitation nursing    Will work in an interdisciplinary manner to address the following impairments and issues:   Mobility, ADLs, Communication impairments, Dysphagia, Impaired strength, Impaired ROM, Impaired endurance, Adherence to precautions, Caregiver training, Medication management and Impaired coordination    Requires 24h rehabilitation nursing to address:  Bladder care, Bowel care, Hydration needs, Medication teaching, Nutrition, Positioning and Safety    Anticipate a discharge to:  Home with assistance    Estimated length of stay: 1-2 weeks      Goals for discharge: Independent  Communication Use  compensatory strategies to express needs and wants     Swallow Tolerate least restrictive oral diet without signs or symptoms of aspiration     Cognition Use compensatory strategies appropriately to compensate       A CIR program is medically necessary to achieve medical and functional goals and no less intensive setting is appropriate as an alternative. The patient requires frequent physician visits, 24 hour rehab nursing care, intensive therapy services and a coordinated rehab program.     Rehab potential: excellent    Prognosis: excellent    Potential limitations:Challenging home environment      Review of Pre-admission assessment  [x]  I have reviewed the nurse liaisons pre-admission assessment in [employer].   I do not note any significant changes at this time and agree with patient's appropriateness for intensive inpatient rehab program.    A complete drug regimen review was completed: Yes      Was I contacted and action was taken by midnight of the next calendar day once issue was identified?: N/A    Person who contacted me:    What was the issue?:    Action taken:    What was the time the issue was identified?:  What was the time the issue was acted upon?:    Signed by: Cyniah Gossard A Benjamin-Allen DO    [facility]

## 2019-10-08 NOTE — Progress Notes (Signed)
[place] [facility] Note:   The patient will admit [facility] [address] [place], [place] [address], [address] , room 523-2  , report to [phone]  Note To DISCHARGING PHYSICIAN: Place a "discharge-readmit" order when a patient transitions over to [facility]. This feature can be accessed through Navigator, then d/c re-admit and then orders.   CM/SW  Katie notified of decision.  Clydie Braun [place] MSN, RN-BC, CRRN, [employer]

## 2019-10-08 NOTE — Progress Notes (Signed)
Patient left unit via wheelchair to PTS transport van. Paperwork with patient, all belongings with patient.

## 2019-10-08 NOTE — Nursing Progress Note (Addendum)
64 yo male admitted 5/3 when found unresponsive/ w altered mental status in the car. Dx of CVA - right thalamic infarct. Hx of borderline HTN and AV nodal re-entry tachycardia. Pt is full code with NKA. Pt AOx4. Pt has 20 G in Right FA. Pt on fall, aspiration, seizure, skin/pressure ulcer precautions. EKG negative,  CT of head shows infarct in R thalamus w/ small amount if intraventricular extension. Pt impulsive and tried numerous times to get out of bed throughout the shift. All fall precautions in place. NIH score of 3 assessed.  Pt uses urinal and is on mechanical soft diet with supplemental ensure 3 x day w meals due to decreased diet. Pt denies SOB, dizziness, and chest pain. Plan is for possible [place] on 5/7 to acute rehab in the [place] area.

## 2019-10-08 NOTE — OT Progress Note (Signed)
Occupational Therapy Note  [facility]  Occupational Therapy Treatment     Patient: Michael HINDERMAN Sr.     MRN#: [id number]   Unit: [address] [facility] [facility]  Bed: V4098/J1914-N    Time of treatment:  Time Calculation  OT Received On: 10/08/19  Start Time: 1115  Stop Time: 1210  Time Calculation (min): 55 min             Chart Review and Collaboration with Care Team: 9 minutes, not included in above time.    OT Visit Number: 2    Precautions and Contraindications:    Precautions  Weight Bearing Status: no restrictions  Precaution Instructions Given to Patient: Yes  Other Precautions: falls, HOB >30*, SBP <140     Personal Protective Equipment (PPE)  gloves and procedure mask    Updated Labs:  Lab Results   Component Value Date/Time    HGB 13.3 10/08/2019 03:52 AM    HCT 43.1 10/08/2019 03:52 AM    K 4.6 10/08/2019 03:52 AM    NA 134 (L) 10/08/2019 03:52 AM    INR 1.2 (H) 10/06/2019 03:49 AM    TROPI 0.01 10/04/2019 09:49 AM    TROPI 0.01 04/14/2013 11:21 PM       All imaging reviewed, please see chart for details.    Subjective:    .  "Now I know what it feels like when my wife moves with equipment; she has a walker"  Patient Goal: get to acute rehab    Patient's medical condition is appropriate for Occupational Therapy intervention at this time.  Patient is agreeable to participation in the therapy session. Nursing clears patient for therapy.  Pain Assessment  Pain Assessment: (reports soreness all over, repositioned)      Objective:  Observation of Patient/Vital Signs:    BP 128/74    Pulse 99    Temp 98.1 F (36.7 C) (Oral)    Resp 17    Ht 1.803 m (5\' 11" )    Wt 65.8 kg (145 lb 1 oz)    SpO2 99%    BMI 20.23 kg/m     Cognition/Neuro Status  Arousal/Alertness: Appropriate responses to stimuli  Attention Span: Appears intact  Orientation Level: Oriented X4  Memory: Appears intact  Following Commands: Follows one step commands consistently  Safety Awareness: minimal verbal instruction  Insights:  Fully aware of deficits;Educated in safety awareness  Problem Solving: minimal assistance  Behavior: anxious;attentive;cooperative  Motor Planning: decreased initiation;decreased processing speed  Coordination: intact  Hand Dominance: right handed    Functional Mobility  Scooting Transfers: Stand by Assist  Supine to Sit Transfers: Stand by Assist  Sit to Stand Transfers: Moderate Assist;additional time;adaptive equipment  Stand to Sit Transfers: Moderate Assist;additional time  Functional Mobility/Ambulation: Minimal Assist    Self-care and Home Management  Eating: Modified Independent;in a chair;at edge of bed;Setup    Therapeutic Exercises  Shoulder AROM: Flexion;Abduction  Shoulder Strengthening: isometrics  Elbow AROM: Flexion;Extension  Hand ROM: grasp and release    Neuro Re-Ed  Balance Training: Sitting reaching activities;Standing reaching activities  Memory/Strategy Training: Short term memory orientation    Educated the patient to role of occupational therapy, plan of care, goals of therapy and HEP, safety with mobility and ADLs, energy conservation techniques, pursed lip breathing, discharge instructions, home safety with verbalized understanding.   Educated and engaged pt in UE and contralateral HEP for increased motor coordination, strength, circulation, activity tolerance, decreased swelling; for greater safety and independence  in ADLs (handout provided for carryover.) Directed pt in transfer to chair using FWW with emphasis on hand placement, posture, pacing, FWW mgmt to increase safety, pt demos good ability with increased time. Rest breaks provided t/o. Pt request OT speak to son about progress (son called room phone) and spoke to son. Ensured safe/comfort positioning, breakfast engagement. Patient left in bedside chair with alarm and all other medical equipment in place and call bell and all personal items/needs within reach.  RN notified of session outcome.      Assessment:  Pt highly motivated  to improve fxl ability and tolerance through continued skilled therapy. Engages in all tasks presented and follows directions well. Demos good carryover of prev sessions. Strongly rec acute rehab for this pt to progress in safety, independence, quality of life, and decrease caregiver burden.       PMP Activity: Step 6 - Walks in Room  Distance Walked (ft) (Step 6,7): 5 Feet      Plan:  Goal Formulation: Patient      Time For Goal Achievement: by time of discharge  ADL Goals  Patient will groom self: at sinkside, Contact Guard Assist, Not met  Patient will dress upper body: Supervision, Not met  Patient will dress lower body: Minimal Assist, Partly met  Patient will toilet: Minimal Assist, Not met  Mobility and Transfer Goals  Pt will perform functional transfers: Contact Guard Assist, Partly met                       Continue plan of care.       DME Recommended for Discharge: Front wheel walker, [place], [facility]  Discharge Recommendation: Acute Rehab      Findley Vi, OTR/L  10/08/2019  12:12 PM

## 2019-10-08 NOTE — Discharge Summary (Signed)
[facility]      Patient: Michael LARD Sr.  Admission Date: 10/04/2019   DOB: [birth year]  Discharge Date: 10/08/2019    MRN: [id number]  Discharge Attending:Dyanara Cozza T Barnie Sopko     Referring Physician: Pcp, None, MD  PCP: Pcp, None, MD       DISCHARGE SUMMARY     Discharge Information   Admission Diagnosis:   Acute CVA (cerebrovascular accident)    Discharge Diagnosis:   Active Hospital Problems    Diagnosis    Acute right thalamic hemorrhagic infarct    Hypertensive urgency    Acute metabolic encephalopathy    Acute CVA (cerebrovascular accident)    Essential hypertension        Admission Condition: Poor  Discharge Condition: Stable  Consultants: Neurology, neurosurgery, ICU  Functional Status: Below baseline  Discharged to: Acute rehab    Discharge Medications:     Medication List      START taking these medications    amLODIPine 5 MG tablet  Commonly known as: NORVASC  Take 1 tablet (5 mg total) by mouth daily     hydrALAZINE 50 MG tablet  Commonly known as: APRESOLINE  Take 1 tablet (50 mg total) by mouth every 8 (eight) hours     levETIRAcetam 750 MG tablet  Commonly known as: KEPPRA  Take 1 tablet (750 mg total) by mouth every 12 (twelve) hours for 7 days           Where to Get Your Medications      Information about where to get these medications is not yet available    Ask your nurse or doctor about these medications   amLODIPine 5 MG tablet   hydrALAZINE 50 MG tablet   levETIRAcetam 750 MG tablet             Hospital Course   Presentation History     Michael Jovel. is a 64 y.o. male with past medical history significant of hypertension who has not seen any physician for many years what brought into emergency room by paramedics when he was found confused by his coworker.  CT of the head done in emergency room showed acute hemorrhagic infarction in the right thalamus.  At present patient is awake alert oriented to self however has been intermittently confused.  See HPI for details.    Hospital  Course (4 Days)     Michael MCEUEN Sr. is a 64 y.o. male with a PMH of hypertension presented to the ED for confusion.    CT head in the ER showed acute hemorrhagic infarction in the right thalamus with intraventricular extension.  He was admitted to the ICU, placed on Cardene drip for hypertensive urgency.  Neurosurgery and neurology were consulted.  Etiology of ICH is suspected to be due to hypertension. Neurosurgery recommends no indication for neurosurgical intervention.  Weaned off Cardene drip.  Mental status has improved. Patient reports generalized pain otherwise no specific complaints.   Patient will be started on Keppra for 7 days  Blood pressure control goal below 140 systolic patient was a started on hydralazine and amlodipine with good blood pressure response.  Met with patient daughter Dondra Prader at the bedside discussed discharge planning to acute rehab versus SNF both are agreeable with discharge to acute rehab awaiting case management evaluation for placement.  No aspirin/NSAIDs/anticoagulation.    Procedures/Imaging:   Upon my review: CT Head WO Contrast    Result Date: 10/04/2019   Essentially stable appearance to  the hemorrhagic infarct involving the right thalamus with intraventricular extension. No new hemorrhage or hydrocephalus is seen. There is minimal, 1 mm, right to left midline shift. Ida Rogue, MD  10/04/2019 7:53 PM    CT Head WO Contrast    Result Date: 10/04/2019  1.  Acute hemorrhagic infarct in the right thalamus with intraventricular extension. No herniation or hydrocephalus. Findings similar to prior study. 2.  Chronic microvascular changes. Raynelle Dick, MD  10/04/2019 11:13 AM    CT Head without Contrast    Result Date: 10/04/2019  1.  Acute hemorrhagic infarct in the right thalamus with intraventricular extension. No herniation or hydrocephalus. 2.  Chronic microvascular changes. Raynelle Dick, MD  10/04/2019 9:49 AM    XR Chest  AP Portable    Result Date: 10/04/2019   No acute  pulmonary or pleural disease. Sandie Ano, MD  10/04/2019 11:18 AM         Best Practices   Was the patient admitted with either a CHF Exacerbation or Pneumonia?  No     Progress Note/Physical Exam at Discharge     Subjective: Feels better denies chest pain nausea vomiting or abdominal pain still feeling generally weak and unstable on his feet.      Vitals:    10/08/19 0603 10/08/19 0747 10/08/19 1002 10/08/19 1209   BP: 130/72 142/55 130/71 128/74   Pulse:  70  99   Resp:    17   Temp:    98.1 F (36.7 C)   TempSrc:    Oral   SpO2:  97%  99%   Weight:       Height:             General: NAD, AAOx3  HEENT: perrla, eomi, sclera anicteric, OP: Clear, MMM  Neck: supple, FROM, no LAD  Cardiovascular: RRR, no m/r/g  Lungs: CTAB, no w/r/r  Abdomen: soft, +BS, NT/ND, no masses, no g/r  Extremities: no C/C/E  Skin: no rashes or lesions noted  Neuro: CN 2-12 intact; No Focal neurological deficits,        Diagnostics     Labs/Studies Pending at Discharge: No    Last Labs   Recent Labs   Lab 10/08/19  0352 10/07/19  0409 10/06/19  0349   WBC 8.01 8.86 7.92   RBC 6.38* 6.28* 6.26*   Hgb 13.3 13.0 13.1   Hematocrit 43.1 42.3 42.6   MCV 67.6* 67.4* 68.1*   Platelets 333 366* 192       Recent Labs   Lab 10/08/19  0352 10/07/19  0409 10/06/19  0349 10/05/19  0313 10/04/19  0949   Sodium 134* 136 134* 137 137   Potassium 4.6 3.9 4.0 3.8 4.0   Chloride 103 104 103 105 104   CO2 18* 21* 21* 23 23   BUN 13.0 10.0 13.0 9.0 11.0   Creatinine 0.8 0.9 0.9 0.9 1.0   Glucose 86 101* 94 95 92   Calcium 8.8 9.1 8.8 9.3 9.4   Magnesium  --   --  1.7 1.6  --        Microbiology Results     Procedure Component Value Units Date/Time    COVID-19 (SARS-COV-2) [facility] Rapid) [[id number]] Collected: 10/04/19 1202    Specimen: Nasopharyngeal Swab from Nasopharynx Updated: 10/04/19 1245     Purpose of COVID testing Screening     SARS-CoV-2 Specimen Source Nasopharyngeal     SARS CoV 2 Overall Result Negative  Comment: Test performed using the Abbott ID  NOW EUA assay.  Please see Fact Sheets for patients and providers located at:  http://www.rice.biz/  This test is for the qualitative detection of SARS-CoV-2  (COVID19) nucleic acid. Viral nucleic acids may persist in vivo,  independent of viability. Detection of viral nucleic acid does  not imply the presence of infectious virus, or that virus  nucleic acid is the cause of clinical symptoms. Negative  results should be treated as presumptive and, if inconsistent  with clinical signs and symptoms or necessary for patient  management, should be tested with an alternative molecular  assay. Negative results do not preclude SARS-CoV-2 infection  and should not be used as the sole basis for patient  management decisions. Invalid results may be due to inhibiting  substances in the specimen and recollection should occur.         Narrative:      o Collect and clearly label specimen type:  o Upper respiratory specimen: One Nasopharyngeal Dry Swab NO  Transport Media.  o Hand deliver to laboratory ASAP  Indication for testing->Extended care facility admission to  semi private room           Patient Instructions   Discharge Diet: cardiac diet     Discharge Activity: as tolerated     Follow Up Appointment:  Follow-up Information     Campbell Lerner, MD Follow up.    Specialties: Neurology, Sleep Medicine, Clinical Neurophysiology, Vascular Neurology  Contact information:  [address] [place]  [address]  [place] [address]  [phone]             your primary care doctor. Schedule an appointment as soon as possible for a visit in 1 week(s).    Why: After discharge follow-up after rehab for follow-up for blood pressure and stroke           Bui, Flonnie Overman, MD. Schedule an appointment as soon as possible for a visit in 1 week(s).    Specialty: Internal Medicine  Why: after discharge from the acute rehab   Contact information:  [address] [facility] [place] [address]  [phone]                    Time  spent examining patient, discussing with patient/family regarding hospital course, chart review, reconciling medications and discharge planning: 40 minutes.    Discussed in details with patient the discharge planning and he verbalized understanding.    Terrace Arabia    3:02 PM 10/08/2019

## 2019-10-08 NOTE — Plan of Care (Signed)
Problem: Pain  Goal: Pain at adequate level as identified by patient  Outcome: Progressing  Flowsheets (Taken 10/07/2019 0434 by Jannet Mantis, RN)  Pain at adequate level as identified by patient:   Identify patient comfort function goal   Assess pain on admission, during daily assessment and/or before any "as needed" intervention(s)   Reassess pain within 30-60 minutes of any procedure/intervention, per Pain Assessment, Intervention, Reassessment (AIR) Cycle   Evaluate if patient comfort function goal is met   Evaluate patient's satisfaction with pain management progress   Offer non-pharmacological pain management interventions     Problem: Every Day - Stroke  Goal: Core/Quality measure requirements - Daily  Outcome: Progressing  Flowsheets (Taken 10/08/2019 0049)  Core/Quality measure requirements - Daily:   VTE Prevention: Ensure anticoagulant(s) administered and/or anti-embolism stockings/devices documented by end of day 2   Continue stroke education (must include Modifiable Risk Factors, Warning Signs and Symptoms of Stroke, Activation of Emergency Medical System and Follow-up Appointments). Ensure handout has been given and documented.   Ensure antithrombotic administered or contraindication documented by LIP by end of day 2  Goal: Neurological status is stable or improving  Outcome: Progressing  Flowsheets (Taken 10/07/2019 0434 by Jannet Mantis, RN)  Neurological status is stable or improving:   Monitor/assess/document neurological assessment (Stroke: every 4 hours)   Monitor/assess NIH Stroke Scale   Re-assess NIH Stroke Scale for any change in status   Perform CAM Assessment  Goal: Stable vital signs and fluid balance  Outcome: Progressing  Flowsheets (Taken 10/07/2019 0434 by Jannet Mantis, RN)  Stable vital signs and fluid balance:   Position patient for maximum circulation/cardiac output   Monitor and assess vitals every 4 hours or as ordered and hemodynamic parameters   Monitor intake and output. Notify LIP if  urine output is < 30 mL/hour.   Encourage oral fluid intake  Goal: Nutritional intake is adequate  Outcome: Progressing  Flowsheets (Taken 10/07/2019 1800 by Rica Mote, RN)  Nutritional intake is adequate:   Monitor daily weights   Assist patient with meals/food selection   Allow adequate time for meals   Consult/collaborate with Clinical Nutritionist   Include patient/patient care companion in decisions related to nutrition   Assess anorexia, appetite, and amount of meal/food tolerated

## 2019-10-08 NOTE — Progress Notes (Signed)
Gave report to RN, Tania at [facility]. Questions and concerns addressed. Patient IV and tele monitor removed. Patient waiting for PTS.

## 2019-10-08 NOTE — Plan of Care (Signed)
Problem: Safety  Goal: Patient will be free from injury during hospitalization  Outcome: Progressing  Flowsheets (Taken 10/08/2019 1303)  Patient will be free from injury during hospitalization:   Assess patient's risk for falls and implement fall prevention plan of care per policy   Provide and maintain safe environment   Use appropriate transfer methods   Ensure appropriate safety devices are available at the bedside   Include patient/ family/ care giver in decisions related to safety     Problem: Every Day - Stroke  Goal: Neurological status is stable or improving  Outcome: Progressing  Flowsheets (Taken 10/08/2019 1303)  Neurological status is stable or improving:   Monitor/assess/document neurological assessment (Stroke: every 4 hours)   Monitor/assess NIH Stroke Scale   Re-assess NIH Stroke Scale for any change in status   Observe for seizure activity and initiate seizure precautions if indicated   Perform CAM Assessment     Problem: Day of Discharge - Stroke  Goal: Core/Quality measures - Discharge  Outcome: Adequate for Discharge  Flowsheets (Taken 10/08/2019 1303)  Core/Quality measures - Discharge:   Document discharge NIH Stroke Scale   Document Modified Rankin Scale ([place] only)   Ensure antithrombotic ordered or contraindication documented by LIP   Ensure statin ordered or contraindication documented by LIP   Complete final verification medications: check and compare prior to admit (PTA), during hospitalization and discharge medication list   Document discharge stroke education completed and patient/family verbalizes understanding

## 2019-10-08 NOTE — OT Plan of Care Note (Signed)
Discharge Recommendation: Acute Rehab   DME Recommended for Discharge: Front wheel walker, Wheelchair-manual, BSC    OT Frequency Recommended: 3-4x/wk     Is PT evaluation indicated at this time? This patient is already on PT caseload.     PMP Activity: Step 6 - Walks in Room  Distance Walked (ft) (Step 6,7): 5 Feet  (Please See Therapy Evaluation for device and assistance level needed)    Treatment Interventions: ADL retraining, Functional transfer training, Endurance training, Patient/Family training, Equipment eval/education, Compensatory technique education       Goal Formulation: Patient  Time For Goal Achievement: by time of discharge  ADL Goals  Patient will groom self: at sinkside, Risk analyst, Not met  Patient will dress upper body: Supervision, Not met  Patient will dress lower body: Minimal Assist, Partly met  Patient will toilet: Minimal Assist, Not met  Mobility and Transfer Goals  Pt will perform functional transfers: Contact Guard Assist, Partly met

## 2019-10-08 NOTE — Rehab Liaison Note ([facility]) (Signed)
Michael White  MRN: [id number]  Account: [id number]  Session Start: 10/08/2019 12:00:00 AM  Session Stop: 10/08/2019 12:00:00 AM    Total Treatment Minutes:  Minutes    Clinical Liaison  Inpatient Rehabilitation Pre Admission Screen    Medical Diagnosis:  revealed stable R thalamus hemorrhagic infarct. ; HTN  Rehab Diagnosis: revealed stable R thalamus hemorrhagic infarct.  Probable Impairment Group Code: 01.1 Left Body Involvement (Right Brain)  Demographics:   Age: 64Y   Gender: Male   Name, phone and relationship of contact person:  Contact Name: Chestine Spore  Phone:   [phone]  Relationship:   Significant Other  Contact Name: Lula Olszewski  Phone:   [phone]  Relationship:   Child   Guardian/Power of Attorney:   It is unknown whether the patient has a Advertising account planner.   [facility] Medical Record Number: [id number]  Past Medical History: Past Medical History:  ? 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  ? Borderline hypertension    on meds in past; BP normal without meds recently  Past Surgical History:  ? CARDIAC ABLATION   04/15/2013    ablation of slow pathway for AVNRT    Prior Level of Functioning:  Mobility:   I with ambulation and drivinig  Activities of Daily Living:  I with ADL's  Cognition:  WNL  Communication:  WNL  Swallowing:  WNL    CARE Tool  Prior Functioning:  Self Care: Patient completed the activities by him/herself, with or without an  assistive device, with no assistance from a helper.  Indoor Mobility: Patient completed the activities by him/herself, with or  without an assistive device, with no assistance from a helper.  Stairs: Patient completed the activities by him/herself, with or without an  assistive device, with no assistance from a helper.  Functional Cognition: Patient completed the activities by him/herself, with or  without an assistive device, with no  assistance from a helper.  Prior Device Use: Patient does not use manual or motorized wheelchair or  scooter, mechanical lift, walker, or an orthotic/prosthesis.  Health Conditions: Patient has not had any falls in the past year. Patient has  not had major surgery during the 100 days prior to admission.    Social History:  Marital Status: Single  Children: 2 both in [place]          Reside:  Employment Status:  Recreational Activities/Hobbies:  Pre-hospital Living Environment: Lives in 2 level home with 2 STE , can stay on  main level with full bath. SO is disabled but her daughter lives with them and  helps her mother, she is there all day and can help some at discharge.  Language:  English  Hand Dominance: Right.    The following information was gathered for consideration and maintenance in the  medical record to substantiate medical necessity for an IRF level of care.    This assessment is being completed by Leonette Monarch , RN,MSN, BC . Patient is  currently at [facility] unit # [phone]  CM Katie [phone].    The patient is being referred and recommended by their physician, Garlan Fillers, to be assessed both medically and functionally in regard to their  premorbid functional capacity to determine whether they can benefit from a  rehabilitation level of care offered by our facility.  The following is information regarding the medical complexity and clinical risk  factors that needs to be considered for the appropriate management of the  patient's care and recovery.  Acute Medical Conditions: Active Hospital Problems    Diagnosis  ? Acute right thalamic hemorrhagic infarct  ? Hypertensive urgency  ? Acute metabolic encephalopathy  ? Acute CVA (cerebrovascular accident)  ? Essential hypertension  History of Present Illness:  64 y.o. male with past medical history significant  of hypertension who has not seen any physician for many years what brought into  emergency room by paramedics when he was found confused by  his coworker.  CT of  the head done in emergency room showed acute hemorrhagic infarction in the right  thalamus.  At present patient is awake alert oriented to self however has been  intermittently confused.  He was admitted to the ICU, placed on Cardene drip for hypertensive urgency.  Neurosurgery and neurology were consulted. Etiology of ICH is suspected to be  due to hypertension. Neurosurgery recommends no indication for neurosurgical  intervention.  Weaned off Cardene drip.  Mental status has improved. Patient  reports generalized pain otherwise no specific complaints. Patient will be  started on Keppra for 7 days Blood pressure control goal below 140 systolic  patient was a started on hydralazine and amlodipine with good blood pressure  response.  patient is A/T/O X3 and following commands, his last BM was 5/4, he is passing  gas. Patient is tolerating a mechanical soft diet, he is working with PT, OT and  SLP, he had an external foley. Patient would benefit from inpatient AR before  going home with his family. Covid as negative 5/3.    This patient has been admitted to the inpatient rehab unit to respond to the  COVID - 19 public health emergency under the CMS 1135 waiver to assist with  hospitalization surge and care capacity constraints. This patient does meet  admission criteria to an inpatient rehab level of care, per CMS guidelines,  however their primary diagnosis and reason for admission does not meet 60% rule  criteria.     Date of Onset: 10/04/19   Date Admitted to Acute:  10/04/19  Current Precautions:   Risk of aspiration/swallowing. Mechanical soft   Risk for falls.  Food Allergies  No known food allergies.  Medication Allergies   No known medication allergies.  Other Allergies Not applicable.    Present Systems Summary:  Vital signs: Maximum Temperature (last 48 hrs):  97.9 degrees.  Pulse:  70 beats per minute.  Respirations:  18 breaths per minute.  Blood Pressure:   130/71 mmHg.  Pulse Ox: 97%  RA  Height/weight:  Height: 5'11"  Weight: 145ibs  Hearing: No current issues  Vision: Patient has vision issues. Needs large print  Other medical comments:   None  DIET:  Current Diet Texture: Mechanical Soft diet.  Current Liquid Consistency: Thin liquids.  Type: Regular.  Bladder: External foley  Bowel: Patient does not have an ostomy.  Date of Last Bowel Movement:  10/05/19 passing gas Patient is continent of bowel.  Integumentary:   Intact.  Cardiopulmonary:   Room Air.  Dialysis: Patient currently is not receiving dialysis.  Current Medication(s): 10/08/19  amLODIPine (NORVASC) tablet 5 mg5 mg, PO, Daily  hydrALAZINE (APRESOLINE) tablet 50 mg50 mg, PO, Q8H SCH  levETIRAcetam (KEPPRA) 100 MG/ML oral solution 750 mg750 mg, PO, Q12H SCH  senna-docusate (PERICOLACE) 8.6-50 MG per tablet 1 tablet1 tablet, PO, Q12H  [facility]    PRN    acetaminophen (TYLENOL) tablet 650 mg650 mg, PO, Q6H PRN  hydrALAZINE (APRESOLINE) tablet 25 mg25 mg, PO, Q6H PRN  melatonin tablet 3 mg3 mg, PO, QHS PRN   as above  Current Alcohol Use:  Patient consumes alcohol. daily  Current Tobacco Use:  Patient uses tobacco. HX of smoking  Current Drug Use: No, patient does not use recreational drugs.  Pain: Patient currently has pain.  Location: general  Type: Acute    Additional medical documentation contributing to the expected care of this  patient may be noted in the following areas:  Laboratory-Chemistry/Hematology: 10/08/2019  WBC 8.01  Hgb 13.3  Hematocrit 43.1  Platelets 333  RBC 6.38High  MCV 67.6Low  MCH 20.8Low  MCHC 30.9Low  RDW 22High  MPV 9.6  Neutrophils 54.9    10/08/2019  Glucose 86  BUN 13.0  Creatinine 0.8  Calcium 8.8  Sodium 134Low  Potassium 4.6  Chloride 103  CO2 18Low  Anion Gap 13.0   as above  Cultures:   10/04/2019 12:45 PM - Interface, Lab In [facility]    Specimen Information: Nasopharynx; Nasopharyngeal Swab    Component  Purpose of COVID testing  Screening  SARS-CoV-2 Specimen Source  Nasopharyngeal  SARS CoV 2 Overall  Result  Negative  Radiology:   10/06/19  ECHO  Findings  Left Ventricle    Left ventricular chamber dimension, wall thickness and function are normal.  No regional wall motion abnormalities.    Visually estimated left ventricular ejection fraction is 65-70%.  Right Ventricle    Right ventricle demonstrated normal chamber dimension, wall thickness and  function.      Left Atrium    Left atrium is normal in size.    Right Atrium    Right atrium is normal in size.    Atrial Septum    No evidence of interatrial shunt by color Doppler.      Aortic Valve    The aortic valve is tricuspid with no significant regurgitation or stenosis.    Pulmonary Valve    The pulmonic valve is normal in structure with no significant regurgitation  or stenosis.   CT: 10/04/19 Head:  IMPRESSION:    Essentially stable appearance to the hemorrhagic infarct involving the  right thalamus with intraventricular extension. No new hemorrhage or  hydrocephalus is seen. There is minimal, 1 mm, right to left midline  shift.   Chest X-Ray: 10/04/19  IMPRESSION:   No acute pulmonary or pleural disease.     CT: 10/04/19  IMPRESSION:      1.  Acute hemorrhagic infarct in the right thalamus with  intraventricular extension. No herniation or hydrocephalus. Findings  similar to prior study.    2.  Chronic microvascular changes   CT: 10/04/19 Head:  IMPRESSION:      1.  Acute hemorrhagic infarct in the right thalamus with  intraventricular extension. No herniation or hydrocephalus.    2.  Chronic microvascular changes.    IVs:  IV Access: Saline Lock  Date of Insertion: 10/06/19    Is patient presently participating in rehabilitation? Yes  Adjustment to Present Illness: Patient is coping adequately.   Patient is accepting limitations adequately.   Patient's expectations are realistic.   Patient is motivated. as I as possible  Activity Tolerance:  Fair.  SPECIAL NEEDS: None.   No travel  No contact with known positive covid  negative covif 5/3    Current Functional  Status  Weight-bearing Status:  No Restrictions  Mobility: 10/07/19  Functional Mobility  Rolling: Stand by Assist  Supine to Sit: Contact Guard Assist(@ trunk)  Scooting to HOB: (side stepping with/without RW )  Scooting to EOB: Stand by Assist(VC for equal positioning )  Sit to Supine: Stand by Assist  Sit to Stand: Moderate Assist;Minimal Assist(Mod A>Min A with/without RW,  retropulsion noted/reliance on bed for support)  Mass practice without AD:  --focusing on anterior trunk lean  --utilizing hands of knee for increased sensation/proprioception w/ improved  anterior trunk lean  --emphasize squeezing of glutes upon standing  Stand to Sit: Minimal Assist  Locomotion  Ambulation: Moderate Assist;Minimal Assist;with front-wheeled walker(mod/min A @  times )  Pattern: decreased cadence;decreased step length;Step to;Wide BOS(dec R stance  time, L knee instability )  Distance Walked (ft) (Step 6,7): 5 Feet  Activities of Daily Living: 10/08/19 per nurse  Eating set up  Mod assist for dressing  Cognition: 10/08/19 Alert/Oriented. x4 Follows commands. No communication deficits  noted.  Communication: 10/08/19 Mild dysarthria  Swallowing:  10/08/19   Mechanical soft diet  Other Impairments: None noted.    Patient is able to understand and make healthcare decisions  No, discussed  information with He is able to make decisions however unavailable so spoke with  SO and step daughter  Payer Source: Level of care will be discussed with the following payer sources  if/when applicable.  Primary: [facility]  Secondary: Not Applicable    Information and Case Discussion:   Rehabilitation risks/benefits were reviewed.  Patient/family/caregiver agrees to/accepts rehabilitation risks/benefits.   Rehab literature/brochure was not provided at this time.  Case will be  discussed with Physician/Medical Director.        IRF Admission Approval/Non-Approval  Appropriateness for admission to the Inpatient Rehabilitation Facility:  The  patient's condition  is sufficiently stable to allow active participation in an  intensive interdisciplinary inpatient rehabilitation program. The patient would  benefit from interdisciplinary inpatient rehabilitation provided by a physician,  rehab-focused nursing, and a minimum of two rehab therapies which will provide  specialized care for the following functional deficits:   Bladder Management.  Communication  Mobility  Pain Management  Safety Risk  Self Care Management  Swallow Function  Comorbid conditions present at pre-admission:  The interdisciplinary team will also manage the potential risks and  complications from the following comorbid conditions:   Hypertension.  Recommended services:  The recommended interdisciplinary team will be comprised of the following  services:   Medical Supervision.  24 Hour Rehabilitation Nursing.  Physical Therapy.  Occupational Therapy.  Speech Therapy.  Patient's expected intensity and frequency of participation in the  interdisciplinary rehabilitation program: is 3 hours of therapy 5 days/week.  Prognosis and level of expected improvement with inpatient rehabilitation stay  is:   Mi ambulation  MI ADL's  Improved swallow  Estimated date of admission to acute inpatient:   10/08/19  Estimated length of inpatient rehabilitation stay in order to achieve rehab  medical/functional goals:   7-10 days  Anticipated destination post discharge from inpatient rehabilitation is:   community discharge with assistance.    Anticipate patient will need the following services post discharge from  inpatient rehabilitation: Home health therapy. Home health nursing.    Physician Approval Status of Admission: Admission Approval:  The patient's  condition is sufficiently stable to allow active participation in an intensive  interdisciplinary inpatient rehabilitation program. 10/08/2019@1152hrs   64 yr old  hypertensive man with right thalamic ICH is inneed of rehab at  IRF intensity  with close medicalmanagement.   RVG    This assessment denotes that on admission to the inpatient rehabilitation  facility, our physician will provide documentation that demonstrates clinical  rehabilitation complications for which the patient is at risk and a specific  plan to avoid those risks. Further, the medical conditions present create  possible adverse conditions that predictably can be controlled through an  intensive rehabilitation plan of care to be outlined at admission.    Department of Medical Assistance Services [facility])  Intensive Rehabilitation Admission Certification    I. Certification Statement:    In accordance with 42 CFR 456.60, I certify that Sharlett Iles  meets the  admission criteria for intensive rehabilitation services set forth in 12 VAC  30-60-120.    II. Criteria Determination:  (In order to meet intensive rehabilitation criteria, the recipient must require  all the items listed below)    The rehabilitation cannot be safely and adequately carried out in a less  intensive setting; and    The interdisciplinary coordinated team approach is required; and    The recipient requires at least 2 of the the four therapies:    Physical Therapy services on a daily basis  Occupational Therapy services on a daily basis  CognitiveTherapy services on a daily basis  Speech-Language Pathology services on a daily basis    III. Physician Signature Required:    Signed by: Leonette Monarch, RN, MSN, [facility] 10/08/2019 11:40:00 AM    Physician CoSigned By: Jordan Hawks 10/08/2019 11:50:56

## 2019-10-09 LAB — CBC
Absolute NRBC: 0 10*3/uL (ref 0.00–0.00)
Hematocrit: 41.8 % (ref 37.6–49.6)
Hgb: 13 g/dL (ref 12.5–17.1)
MCH: 20.8 pg — ABNORMAL LOW (ref 25.1–33.5)
MCHC: 31.1 g/dL — ABNORMAL LOW (ref 31.5–35.8)
MCV: 66.9 fL — ABNORMAL LOW (ref 78.0–96.0)
MPV: 8.8 fL — ABNORMAL LOW (ref 8.9–12.5)
Nucleated RBC: 0 /100 WBC (ref 0.0–0.0)
Platelets: 327 10*3/uL (ref 142–346)
RBC: 6.25 10*6/uL — ABNORMAL HIGH (ref 4.20–5.90)
RDW: 22 % — ABNORMAL HIGH (ref 11–15)
WBC: 6.3 10*3/uL (ref 3.10–9.50)

## 2019-10-09 LAB — CK: Creatine Kinase (CK): 119 U/L (ref 47–267)

## 2019-10-09 NOTE — Rehab Evaluation ([facility]) (Addendum)
Corrected 10/09/2019 4:09:38 PM    NAME: Michael White  MRN: [id number]  Account: [id number]  Session Start: 10/09/2019 9:00:00 AM  Session Stop: 10/09/2019 10:00:00 AM    Total Treatment Minutes: 60.00 Minutes    Speech Language Pathology  Inpatient Rehabilitation Language Cognitive-Dysphagia Evaluation    Rehab Diagnosis: revealed stable R thalamus hemorrhagic infarct.  Demographics:            Age: 64Y            Gender: Male  Primary Language: English    Past Medical History: Past Medical History:  ? 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  ? Borderline hypertension    on meds in past; BP normal without meds recently  Past Surgical History:  ? CARDIAC ABLATION   04/15/2013    ablation of slow pathway for AVNRT  History of Present Illness: 64 y.o. male with past medical history significant  of hypertension who has not seen any physician for many years what brought into  emergency room by paramedics when he was found confused by his coworker.  CT of  the head done in emergency room showed acute hemorrhagic infarction in the right  thalamus.  At present patient is awake alert oriented to self however has been  intermittently confused.  He was admitted to the ICU, placed on Cardene drip for hypertensive urgency.  Neurosurgery and neurology were consulted. Etiology of ICH is suspected to be  due to hypertension. Neurosurgery recommends no indication for neurosurgical  intervention.  Weaned off Cardene drip.  Mental status has improved. Patient  reports generalized pain otherwise no specific complaints. Patient will be  started on Keppra for 7 days Blood pressure control goal below 140 systolic  patient was a started on hydralazine and amlodipine with good blood pressure  response.  patient is A/T/O X3 and following commands, his last BM was 5/4, he is passing  gas. Patient is tolerating a mechanical soft diet, he is working with PT,  OT and  SLP, he had an external foley. Patient would benefit from inpatient AR before  going home with his family. Covid as negative 5/3.   Date of Onset: 10/04/19   Date of Admission: 10/08/2019 4:57:00 PM  Premorbid Functional Level: Independent with ADLs, IADLs + driving. 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  Social/Educational History: Per H/T/P, 'Tobacco-Quit September 13, 2019. Smoked .75  PPD for 12 years  Alcohol-Quit September 13, 2019.  Had 2-3 beers after work daily. Works full time as  a Special educational needs teacher over 23 years.  Lives with his wife, stepdaughter, son-in-law  and their 6 children. 2 STE and full flight within. Wife has Alzheimer's, and  patient's stepdaughter acts as her primary caregiver.  He has 2 children who  live in [facility]  Home Environment: Per CM liason note, pt lives in 2 level home with 2 STE , can  stay on main level with full bath. SO is disabled but her daughter lives with  them and helps her mother, she is there all day and can help some at discharge.    Medications and Allergies: Significant rehabilitation considerations:   See EPIC  Rehabilitation Precautions/Restrictions:   Risk of aspiration/swallowing. Mechanical soft   Risk for falls    SUBJECTIVE  Patient/Caregiver Goals:  Patient's functional goals: "Go home and get better"    Understanding of Current Condition: Good understanding of medical /  functional  status. Pt. had many questions regarding his injury, implications from CVA and  plan of care.  Pain: Patient currently has pain.  Location: General pain / stiffness and soreness across body  Type: Acute  Quality: Aching.  Pain Scale: Behavioral.  Patient reports a pain level of 2 out of 10.  Patient's acceptable level of pain 2 out of 10.   Pain does not interfere with any activity at this time.  Pain is alleviated by:  Pain is exacerbated by: No pain intervention was facilitated because denies  pain/discomfort at this time.  Pain Reassessment: Pain was not reassessed as no pain was  reported.    OBJECTIVE  Vision and Hearing: Owns reading glasses, did not use in ST eval. Denies acute  visual changes since CVA. No indication of hearing impairment.    Oral Motor Exam: Incomplete upper dentition, no dentures. Slight R lingual  deviation. Facial symmetry present. Adequate lingual range of motion. Reduced  and slightly effortful lingual coordination as indicated on diadochokinesis task  'patapa, patapa, pataka'.Oral cavity typical coloring and moist.  Swallow:   Clinical Assessment: SLP assessed swallow function with regular texture (graham  crackers) and thin liquid H2O via cup sips. 3oz water test failed only due to  delayed processing of instructions, consumed only 2 out of 3oz. No overt s/s  aspiration / penetration with thin liquid intake. Regular consistency consumed  with adequate and typical mastication patterns. Hyolaryngeal present on  observation. Oral clearance achieved following each bite. Pt. states his prior  diet is regular texture although he avoids some foods due to missing upper  dentition.   Recommended Diet: Mechanical Soft diet. Thin liquids.   Swallow Precautions: Upright position, Oral care after meals    Language/Cognitive Tests Administered:  Hopkins Verbal Learning Test (HVLT) form  6  Portions from Cognitive Linguistic Quick Test (CLQT)    HVLT:  - Total Recall raw score: 18, Borderline  - Delayed Recall raw score: 4, Profound  - Retention (%) raw score: 57%, Severe  - Recognition Discrimination Index raw score: 6, Profound    CLQT:  - Personal Facts: 8/8  - Symbol Cancellation: 0/12  - Confrontation Naming: 10/10  - Clock Drawing: 4/13  - Story Retelling: 9/10  - Design Memory: 4/6    Communication:   Provided extra time, pt verbally responds to 100% queries. Verbal output is  fluent and appropriate. No indication of apraxia or motor speech  impairment.Delayed latency although primarily due to processing delay as opposed  to word finding deficit. Social communication was  appropriate, on topic and  meaningful. Eye contact present although new onset of L inattention resulting in  eye gaze frequently shifting to right.    Cognition:   Attention:       L inattention present in structured setting as well as during  conversation, although less present in ADLs (ie oral care at sink).Although L  inattention remained prevalent thorughout written tasks, gross inattention  across B visual fields apparent during symbol cancellation and clock drawing.  Sustained attention in structured setting appeared a strength.   Processing: Moderate delayed verbal and motoric processing. He requires  extended period of time to initiate and execute single step directions, although  once initiated he is able to acheive 100% accuracy of specified instruction.  Processing breakdowns evident with abstract queries, as he responded  appropriately although often with incomplete answers.   Behavior: Calm, appropriate, motivated   Memory:  Borderline to profound memory deficits indicated on standardized  assessment. No indication of memory strategy in place, such as association or  repetition. However with personally relevant information he demonstrates less  impairment. He is oriented x4 and recalled multiple events from recent medical  timeline without additional prompt or reminder.   Problem Solving/Reasoning: Suspect at least a mild impairment, most evident in  complex multi sequential tasks, due to severe processing delay. He demonstrated  adequate sequencing and simple problem solving skills while completing oral care  / hygiene.   Awareness/Judgment: Highly aware of current medical and functional situation.  "This stroke really messed me up", including reciting several specific physical  and cognitive changes since injury onset.   Executive Functioning: Suspect at least a mild/moderate impairment due to  processing and attention deficits. [facility] Scale*:  The patient?s cognitive functioning  was assessed  using the [facility] Cognitive Scale. Score based on this assessment is:  Not Applicable.    CARE Tool  The scores below reflect the patient's usual function prior to therapeutic  intervention.    Hearing, Speech, Vision: Expression of Ideas and Wants: Expresses complex  messages without difficulty and with speech that is clear and easy to  understand.  Understanding Verbal and Non-Verbal Content: Understands: Clear comprehension  without cues or repetitions.    Interventions: None provided today.  Patient was left seated in chair in his/her room. Handoff to nurse completed.  Chair alarm in place and activated.  Oriented to call bell and placed within reach.  Personal items within reach.  Interdisciplinary Educational Needs and Learning Preferences:       Learning Preference: The patient's preferred learning method is:  Explanation.       Barriers to Learning: No barriers.       Learning Needs: Communication/cognition, Stroke, Swallowing    Education Provided: Plan of care. Swallow precautions. Stroke.  Supervision/assistance requirements       Audience: Patient.       Mode: Explanation.       Response: Verbalized understanding.  Needs reinforcement.    ASSESSMENT  Mr. Mckelvin presents to acute rehab with moderate cognitive linguistic deficits  and mild oral dysphagia s/p R thalamic hemorrhagic infarct. Processing delay and  inattention are major factors impacting his performance during basic and higher  level cognitive tasks in addition to short term memory impairment and mild left  inattention impeding safety during ADL / ambulation routines. Regarding swallow  function, he presented with functional and effective management of regular  texture trials during today?s assessment and would benefit from further  evaluation during a meal assessment. Patient?s motivation and family Enos Fling  support system are good prognostic indicators for his recovery, but anticipate  patient will require some  degree of assistance to manage complex needs at time  of d/c. Recommend ST to target above mentioned deficits, and provide appropriate  family training for safe d/c.    Rehab Potential: Able to participate in an intensive inpatient interdisciplinary  rehabilitation program, Good family/social support, Good premorbid functional  status, Living in the community premorbidly, Motivated  Barriers to Progress/Discharge: No potential barriers to progress.    Short Term Goals: Time frame to achieve short term goal(s):  1 week from IE       1. Patient will demonstrate improved initiation and processing by following  2-3 step directions given moderate cues to increase safety upon d/c.       2. Patient will demonstrate  adequate left visual attention to identify  items in his proximal, mildly distracting environment 80% of the time provided  verbal prompts as needed.       3. Patient will implement safe swallow techniques, specifically reduced  intake pace, alternate liquids/solids within regular meal trial provided  supervision as needed.  Long Term Goals:  Time frame to achieve long term goal(s):  2-3 weeks from IE on  5/8       1. Patient will incorporate swallow strategies to maintain adequate  mastication and functional oral clearance in order to safely tolerate regular  texture/thin liquid diet in 100% opportunities mod I.       2. Patient will demonstrate adequate attention and visual / verbal  processing to complete ADLs and mild / moderately complex cognitive tasks for  periods of 20 minutes provided min redirections.       3. Pt. will demonstrate use of working memory strategies (verbal rehearsal,  repetition, clarification) in order to acquire and retain task instructions and  strategies in environmental navigation and simple visuo-spatial activities with  min A.    Risks/Benefits of Rehabilitation Discussed with Patient/Caregiver: Yes.  Recommendations/Goals for Rehabilitation Discussed with Patient/Caregiver:  Yes.    PLAN  Speech Pathology Plan: Speech Language Pathology is recommended to address:  Recommended Frequency/Duration/Intensity: 1:1 or group settings, 60 - 120  minutes/day, 5-6x/week for 2-3 weeks from IE on 5/8  Activities Contributing Toward Care Plan: cognitive re-training, cognitive  compensatory strategy trianing, meal trial tray for diet advancement, dysphagia  strategy training, collaborative d/c planning, stroke education  The patient is appropriate for group treatment. Patient will benefit from group  therapy to practice and carry over skills learned in individual sessions with  new partners, clinicians and contexts    TEAM CARE PLAN  Please review Integrated Patient View Care Plan Flowsheet for Team identified  Problems, Interventions, and Goals.    Identified problems from team documentation:      Identified problems from this assessment:     Cognition : Patient will demonstrate adequate attention and visual / verbal  processing to complete ADLs and mild / moderately complex cognitive tasks for  periods of 20 minutes provided min redirections.   Swallow Function : Patient will incorporate swallow strategies to maintain  adequate mastication and functional oral clearance in order to safely tolerate  regular texture/thin liquid diet in 100% opportunities mod I.    Discipline:  Speech/Language Pathology    3 Hour Rule Minutes: 60 minutes of SLP treatment this session count towards  intensity and duration of therapy requirement. Patient was seen for the full  scheduled time of SLP treatment this session.  Therapy Mode Minutes: Individual: 60 minutes.    * Original [place] [address] Cognitive Scale co-authored by Bonney Aid, Ph.D.,  Margot Chimes, M.A., Lanier Ensign, M.A., [facility], [place].  Revised 04/17/73 by Margot Chimes, M.A., and Vernie Shanks, O.T.R.    Signed by: Jamison Neighbor, M.A., CCC/SLP 10/09/2019 10:00:00 AM

## 2019-10-09 NOTE — Rehab Evaluation ([facility]) (Signed)
Michael White  MRN: [id number]  Account: [id number]  Session Start: 10/09/2019 11:00:00 AM  Session Stop: 10/09/2019 12:00:00 PM    Total Treatment Minutes: 45.00 Minutes    Physical Therapy  Inpatient Rehabilitation Eval    Rehab Diagnosis: revealed stable R thalamus hemorrhagic infarct.  Demographics:            Age: 64Y            Gender: Male  Primary Language: English  Past Medical History: Past Medical History:  ? 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  ? Borderline hypertension    on meds in past; BP normal without meds recently  Past Surgical History:  ? CARDIAC ABLATION   04/15/2013    ablation of slow pathway for AVNRT  History of Present Illness: 64 y.o. male with past medical history significant  of hypertension who has not seen any physician for many years what brought into  emergency room by paramedics when he was found confused by his coworker.  CT of  the head done in emergency room showed acute hemorrhagic infarction in the right  thalamus.  At present patient is awake alert oriented to self however has been  intermittently confused.  He was admitted to the ICU, placed on Cardene drip for hypertensive urgency.  Neurosurgery and neurology were consulted. Etiology of ICH is suspected to be  due to hypertension. Neurosurgery recommends no indication for neurosurgical  intervention.  Weaned off Cardene drip.  Mental status has improved. Patient  reports generalized pain otherwise no specific complaints. Patient will be  started on Keppra for 7 days Blood pressure control goal below 140 systolic  patient was a started on hydralazine and amlodipine with good blood pressure  response.  patient is A/T/O X3 and following commands, his last BM was 5/4, he is passing  gas. Patient is tolerating a mechanical soft diet, he is working with PT, OT and  SLP, he had an external foley. Patient would benefit from inpatient AR  before  going home with his family. Covid as negative 5/3.   Date of Onset: 10/04/19   Date of Admission: 10/08/2019 4:57:00 PM  Premorbid Functional Level: Pt was independent per his own report.  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  Per OT report, full bathroom is up a flight of stairs.   Pt is partially the caregiver for his spouse who has (per patient) dementia.  Spouse's daughter is the primary caregiver, and is also the mother of six  children ages 29-14.  Patient lives with his spouse, her daughter, son-in-law and  their six children.  Per OT pt has an adult son in [place].  Unknown if there are steps to enter the home.    Medications and Allergies: Significant rehabilitation considerations:   Please see EPIC.  Rehabilitation Precautions/Restrictions:   Risk of aspiration/swallowing. Mechanical soft  Risk for falls    SUBJECTIVE  Patient/Caregiver Goals:  Patient's functional goals: To be able to walk and go  home.  Pain: Patient currently without complaints of pain.  Pain Reassessment: Pain was not reassessed as no pain was reported.    OBJECTIVE  General Observations: Orders recevied, chart reviewed.  RN, Lurena Joiner, cleared pt  for participation in  PT evaluaiton.  Pt recevied seated in wheelchair, leaning B  forearms forward onto his knees dressed in a hospital gown.  Pt with no noted  lines or leads at this time.  Understanding of Current Condition: Pt is aware he had a stroke.  Pt feels like  it has been a massive impact on him.  Pt with very slow processing and very  fatigued this session.  His awareness of his functional status is thus not fully  clear.  Vital Signs:                       Before Activity              After Activity  Vitals  Time                 1109                         1120  Position/Activity    seated                       seated  BP Systolic           134                          125  BP Diastolic         94                           84  Pulse                60                           83    Communication/Cognition: Pt very fatigued this session (third hour of evaluation  since 8am).  Pt with slow mental processing, decreased recall and uncertain  problem solving and safety awareness.  Will need additional assessment with  functional activity and/or via SLP assessment.  Sensation: Pt reports grossly intact light touch in B LEs and B UEs.  Range of Motion: B LEs WFL.  R knee terminal extension slow to achieve fully,  but pt is able to fully extend with extensive verbal /T/ tactile cues.  Strength/Motor Control: Pt with L LE and R LE strength with knee extensio nand  ankle DF 5/5 when assessed in sitting.  B hip flexor strength, assessed in  sitting, 4-/5.  Pt with no evident motor control impairments this session, but due to fatigue pt  seen almost entirely in wheelchair.  Per OT, pt with lateral left lean in  sitting unsupported.  During this session, pt mainly electing to lean forward  with forearms onto knees unclear is for support or due to fatigue.  Balance: Unable to assess standing balance due to unable to stand patient this  session.  Sitting balance required CGA for brief time pt was seated at edge of  bed until he was assisted to lie down.    Lower Extremity Orthosis: Patient does not present with foot drop or ankle  instability and does not need an orthotic.    Interventions:  Moderate Complexity Evaluation: An examination of body systems using  standardized tests and measures in addressing a total of 3 or more elements from  any of the following body structures  and functions, activity limitations, and/or  participation restrictions  Clinical decision-making of moderate complexity using standardized patient  assessment instrument and/or measurable assessment of functional outcome No  treatment provided today.  Patient was returned to or left in  bed. Handoff to nurse completed.  Bed alarm in place and activated.  Oriented to call bell and placed within reach.  Personal items within reach.    Interdisciplinary Educational Needs and Learning Preferences:       Learning Preference: The patient's preferred learning method is:  Explanation.  The patient's preferred learning method is: Demonstration.       Barriers to Learning:  Fatigue       Learning Needs: Equipment, Functional activities/mobility, Plan of care,  Precautions, Rehabilitation techniques and procedures, Safety, Stroke    Education Provided: Plan of care.       Audience: Patient.       Mode: Explanation.       Response: Verbalized understanding.  Needs reinforcement.    ASSESSMENT  Summary of Deficits and Prognosis: Pt is a 64 year old polite gentleman who  presents with extreme fatigue, decreased processing and limited ability to  participate due to fatigue.  Pt has mild decreased L attention evident this  session.  Pt is anticipated to probably need at least supervision at discharge  due to decreased processing.  Pt has previously been assisting to care for his  spouse (not legally married) with whom he lives.  Pt is anticipated to make good  progress once fatigue is resolved.  Pt presents with decreased strength,  decreased endurance, decreased processing, decreased L attention and decreased  balance which negatively impact his safety and indepdnendce with bed mobility,  transfsers, ambulation and stair negotiation.  Pt would benefit from physical  therapy to  improve in these areas to maximize his safety and indepednence.  Rehab Potential: Good premorbid functional status, Living in the community  premorbidly, Motivated  Barriers to Progress/Discharge: Architectural barriers in home, Poor activity  tolerance    CARE Tool  The scores below reflect the patient's usual function prior to therapeutic  intervention.    Mobility Functional Assessment  Roll Left and Right: 31 - Not attempted due to  medical or safety concerns.   Pt too fatigued to complete this activity.    Sit to Lying: 03 - Partial/Moderate Assistance: Helper does 1-25% of the  activity  Assistive Device(s):   Bed rail    Lying to Sitting on Side of Bed: 88 - Not attempted due to medical or safety  concerns.   Pt too fatigued to complete this assessment.    Sit to Stand: 96 - Not attempted due to medical or safety concerns.   Attempted sit to stand from wheelchair 2x without success, even with max A.  Pt  too fatigued to use his LEs.    Chair/Bed-to-Chair Transfer: 01 - Dependent as the assistance of 2 or more  helpers is required for the patient to complete the activity.  Amount of Effort Provided by Helpers:   Substantial/maximal  (Helpers did more  than half of  the effort)  Assistive Device(s):   None   Each assisting person (1 PT, 1 RN) provided help at the pt both at this hip and  under his arm for a stand pivot transfer from wheelchair to bed due to his  extreme fatigue.    Car Transfer: 5 - Not attempted due to medical or safety concerns.   Pt too fatigued to complete  this assessment.    Walk 10 Feet: 82 - Not attempted due to medical or safety concerns.   Pt too fatigued to complete this assessment.    Walk 50 Feet with Two Turns: 88 - Not attempted due to medical or safety  concerns.   Pt too fatigued to complete this assessment.    Walk 150 Feet:  88 - Not attempted due to medical or safety concerns.   Pt too fatigued to complete this assessment.    Walking 10 Feet on Uneven Surface: 88 - Not attempted due to medical or safety  concerns.   Pt too fatigued to complete this assessment.    1 Step (curb):  88 - Not attempted due to medical or safety concerns.   Pt too fatigued to complete this assessment.    4 Steps:  10 - Not attempted due to medical or safety concerns.   Pt too fatigued to complete this assessment.    12 Steps: 26 - Not attempted due to medical or safety concerns.   Pt too fatigued to complete this  assessment.    Wheelchair: Patient does not use a wheelchair or scooter.  Mobility Discharge Goals (not met at this time):   Chair/Bed to Chair Transfer: Helper provides verbal cues or touching/steadying  assistance as patient completes activity.   Walk Discharge Goals:   Walk 150 Feet: Helper provides verbal cues or touching/steadying assistance as  patient completes activity.   12 Steps Up and Down, With/Without Rail: Helper provides verbal cues or  touching/steadying assistance as patient completes activity.    Short Term Goals:  Time frame to achieve short term goal(s): 1 week from initial  evaluation on 10/09/19.       1. Pt will perform supine to sit with head of bed raised with supervision  and use of bed rail.       2. Pt will perform stand pivot transfer with RW with CGA from bed to  wheelchair.       3. Pt will ambulate 25' with min A using RW.       4. Pt will ascend/descend 4 stairs with min A and B UE on rails.  Long Term Goals:   Time frame to achieve long term goal(s): 3 weeks from initial  evaluation on 10/09/19.       1. Pt will be independent with bed mobility on a flat bed without bed rails  for increased safety and indepednence upon discharge.       2. Pt will perform stand pivot transfers with most appropriate assisitve  device with supervision for increased safety and indepdence upon dishcarge.       3. Pt will ambulate 150 supervision with most appropriate assistive device  for increased safety and indepdndence upon discharge.       4.  Pt will ascend/descend 12 stairs with one rail supervision for  increased safety and indepednence upon discharge.       5. Pt and his family will participate in education/training to allow for a  safe discharge.    Risks/Benefits of Rehabilitation Discussed with Patient/Caregiver: Yes.  Recommendations/Goals for Rehabilitation Discussed with Patient/Caregiver: Yes.    PLAN  Physical Therapy Plan: Physical Therapy is recommended.  Recommended  Frequency/Duration/Intensity: 60-120 minutes per day, 5-6 days per  week for 3 weeks from initial evaluation on 10/09/19.  Activities Contributing Toward Care Plan: therapeutic exercise, therapeutic  activity, balance training, bed mobility training, strength training, gait  training, stair negotiation training,  DME assessment, discharge planning in a  1:1 or small group setting in a multidiscplinary environment.  The patient is not appropriate for group treatment.    Team Care Plan  Please review Integrated Patient View Care Plan Flowsheet for Team identified  Problems, Interventions, and Goals    Identified problems from team documentation:  Problem: Impaired Cognition  Cognition: Primary Team Goal: Patient will demonstrate adequate attention and  visual / verbal processing to complete ADLs and mild / moderately complex  cognitive tasks for periods of 20 minutes provided min redirections./Active    Problem: Impaired Swallowing  Swallowing: Primary Team Goal: Patient will incorporate swallow strategies to  maintain adequate mastication and functional oral clearance in order to safely  tolerate regular texture/thin liquid diet in 100% opportunities mod I./Active    Identified problems from this assessment:     Mobility : Pt will perform majority of mobility at a supervision level with  most appropriate assistive device for increased safety and indepednence upon  discharge.    Discipline:  Physical Therapy    3 Hour Rule Minutes: 45 minutes of PT treatment this session count towards  intensity and duration of therapy requirement. Patient was not seen for the full  scheduled time of PT treatment this session.   Pt was too fatigued and was unable to continue to participate.  Will attempt to see patient tomorrow,  as scheduled  Therapy Mode Minutes: Individual: 45 minutes.    Signed by: Halina Maidens, PT 10/09/2019 12:00:00 PM

## 2019-10-09 NOTE — Rehab Evaluation ([facility]) (Addendum)
Corrected 10/09/2019 7:43:03 AM    NAME: Michael White  MRN: [id number]  Account: [id number]  Session Start: 10/09/2019 12:00:00 AM  Session Stop: 10/09/2019 12:00:00 AM    Total Treatment Minutes:  Minutes    Rehabilitation Nursing  Inpatient Rehabilitation Admission Assessment - Functional Status and Care Plan    Rehab Diagnosis: revealed stable R thalamus hemorrhagic infarct.  Demographics:            Age: 64Y            Gender: Male            Date of Onset: 10/04/19            Date of Admission: 10/08/2019 4:57:00 PM  Primary Language: [place]    Past Medical History: Past Medical History:  ? 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  ? Borderline hypertension    on meds in past; BP normal without meds recently  Past Surgical History:  ? CARDIAC ABLATION   04/15/2013    ablation of slow pathway for AVNRT  History of Present Illness: 64 y.o. male with past medical history significant  of hypertension who has not seen any physician for many years what brought into  emergency room by paramedics when he was found confused by his coworker.  CT of  the head done in emergency room showed acute hemorrhagic infarction in the right  thalamus.  At present patient is awake alert oriented to self however has been  intermittently confused.  He was admitted to the ICU, placed on Cardene drip for hypertensive urgency.  Neurosurgery and neurology were consulted. Etiology of ICH is suspected to be  due to hypertension. Neurosurgery recommends no indication for neurosurgical  intervention.  Weaned off Cardene drip.  Mental status has improved. Patient  reports generalized pain otherwise no specific complaints. Patient will be  started on Keppra for 7 days Blood pressure control goal below 140 systolic  patient was a started on hydralazine and amlodipine with good blood pressure  response.  patient is A/T/O X3 and following commands, his last BM was  5/4, he is passing  gas. Patient is tolerating a mechanical soft diet, he is working with PT, OT and  SLP, he had an external foley. Patient would benefit from inpatient AR before  going home with his family. Covid as negative 5/3.  Social History:  Marital Status: Single  Children: 2 both in [place]          Reside:  With patient  Employment Status:  Recreational Activities/Hobbies:    Rehabilitation Precautions Restrictions:   Risk of aspiration/swallowing. Mechanical soft   Risk for falls    Orientation to Rehabilitation: Patient was given instructions and information on  hospital orientation. Orientation was provided for the following areas:  orientation checklist, rehab handbook, bed controls, call light, chaplain, daily  routine, meal times, phone and phone numbers, and visiting hours., Orientation  checklist, Bed controls, Call light, Chaplain, Daily routine, Meal times, Phone  and phone numbers, Visiting hours  Patient/Caregiver Goals:  Patient's functional goals: Pt will recognize  limitation and call for help before attempting mobility.    Pt will verbalizse pain 2/10 with current pain regimen.    Pt willl change position by self evey 2 hours  while in bed and sitting in wheel  chair to preven to skin break down.    Wounds/Incisions: No wounds or incisions.  Medication Review: No clinically significant medication issues identified this  shift.    CARE Tool  The scores below reflect the patient's usual function prior to therapeutic  intervention.    Self-Care Functional Assessment  Eating: 02 - Substantial/Maximal Assistance: Helper does 76-99% of the activity  Assistive Device(s):  None    Oral Hygiene: 05 - Setup or Clean Up Assistance: Helper sets up or cleans up  prior to or following an activity  Assistive Device(s):   None  Context for oral hygiene:   Seated    Toileting Hygiene: 04 - Supervision: Helper provides supervision for safety or  verbal cues ONLY  Toileting Equipment:   Urinal  Assistive  Device(s):   None    Shower/Bathe Self: 88 - Not attempted due to medical or safety concerns.    Upper Body Dressing: Dressing and undressing were observed/assessed.   02 - Substantial/Maximal Assistance: Helper does 51-75% of the activity  Assistive Device(s):   None    Lower Body Dressing: Dressing and undressing were observed/assessed. 02 -  Substantial/Maximal Assistance: Helper does 51-75% of the activity  Assistive Device(s):   None    Putting On/Taking Off Footwear: Putting on and taking off footwear were  observed/assessed. 02 - Substantial/Maximal Assistance: Helper does 51-75% of  the activity  Assistive Device(s):   None    Mobility Functional Assessment  Roll Left and Right: 02 - Substantial/Maximal Assistance: Helper does 51-75% of  the activity  Assistive Device(s):   Bed rail    Sit to Lying: 02 - Substantial/Maximal Assistance: Helper does 51-75% of the  activity  Assistive Device(s):   Bed rail    Lying to Sitting on Side of Bed: 02 - Substantial/Maximal Assistance: Helper  does 76-99% of the activity  Assistive Device(s):   Bed rail    Sit to Stand: 02 - Substantial/Maximal Assistance: Helper does 51-75% of the  activity  Assistive Device(s):   Bed rail    Chair/Bed-to-Chair Transfer: Not observed/assessed.    Toilet Transfer: 36 - Not attempted due to medical or safety concerns.    Special Treatments, Procedures, and Programs: Patient received total parenteral  nutrition treatment at the time of admission.    Bladder and Bowel Counts:                       Bladder (# only)             Bowel (# only)  Number of Episodes  Continent            2                            0  Incontinent          0                            0    Interdisciplinary Educational Needs and Learning Preferences:       Learning Preference: The patient's preferred learning method is:  Explanation.       Barriers to Learning: No barriers.       Learning Needs: Medical management    Education Provided: Skin/wound care.        Audience: Patient.       Mode: Explanation.       Response: Needs reinforcement.    Rehab Potential: Good family/social support  Barriers to Progress/Discharge: Medical  condition    TEAM CARE PLAN  Identified problems from team documentation:      Identified problems from this assessment:     No problems identified at this time.    Please review Integrated Patient View Care Plan Flowsheet for Team identified  Problems, Interventions, and Goals.    Signed by: Renae Fickle, RN 10/09/2019 2:33:00 AM

## 2019-10-09 NOTE — Rehab Progress Note ([facility]) (Signed)
NAMEBRAEDYN White  MRN: [id number]  Account: [id number]  Session Start: 10/09/2019 12:00:00 AM  Session Stop: 10/09/2019 12:00:00 AM    Total Treatment Minutes:  Minutes    Rehabilitation Nursing  Inpatient Rehabilitation Shift Assessment    Rehab Diagnosis: revealed stable R thalamus hemorrhagic infarct.  Demographics:            Age: 21Y            Gender: Male  Primary Language: English    Date of Onset:  10/04/19  Date of Admission: 10/08/2019 4:57:00 PM    Rehabilitation Precautions Restrictions:   Risk of aspiration/swallowing. Mechanical soft   Risk for falls    Patient Report: "I am fine"  Patient/Caregiver Goals:  Pt will recognize limitation and call for help before  attempting mobility.    Pt will verbalizse pain 2/10 with current pain regimen.    Pt willl change position by self evey 2 hours  while in bed and sitting in wheel  chair to preven to skin break down.    Wounds/Incisions: No wounds or incisions.    Medication Review: No clinically significant medication issues identified this  shift.    Bowel and Bladder Output:                       Bladder (# only)             Bowel (# only)  Number of Episodes  Continent            2                            0  Incontinent          0                            0    Additional Education Provided:    Education Provided: Precautions. Pain management. Pain scale. Medication  options. Side effects. Safety issues and interventions. Fall protocol.  Medication. Name and dosage. Administration. Purpose. Side Effects.       Audience: Patient.       Mode: Explanation.       Response: Verbalized understanding.    TEAM CARE PLAN  Identified problems from team documentation:      Please review Integrated Patient View Care Plan Flowsheet for Team identified  Problems, Interventions, and Goals.    Signed by: Oscar La, RN 10/09/2019 12:42:00 PM

## 2019-10-09 NOTE — Progress Notes (Signed)
[facility] Attending Face to Face Progress  Note  [X] Discussed with nurse  .[x] No new events    Subjective:  Feels well;  has no complaints.    No headache. No shortness of breath. No chest pain. No constipation.  Objective:  Vitals: BP (!) 134/94    Pulse 76    Temp 97.9 F (36.6 C) (Oral)    Resp 16    Ht 1.803 m (5\' 11" )    Wt 65.5 kg (144 lb 6.4 oz)    SpO2 97%    BMI 20.14 kg/m   Appears well.In no apparent distress.Resting comfortably   Awake ; normal mood and affect.Sclerae anicteric.   Moist mucous membranes.   No respiratory distress .        New labs   Results     Procedure Component Value Units Date/Time    Creatine Kinase (CK) [[id number]] Collected: 10/09/19 0534    Specimen: Blood Updated: 10/09/19 0715     Creatine Kinase (CK) 119 U/L     MRSA culture - Nares [[id number]] Collected: 10/09/19 0425    Specimen: Culturette from Nares Updated: 10/09/19 0648    MRSA culture - Throat [[id number]] Collected: 10/09/19 0425    Specimen: Culturette from Throat Updated: 10/09/19 0648    CBC without differential [[id number]]  (Abnormal) Collected: 10/09/19 0534    Specimen: Blood Updated: 10/09/19 0629     WBC 6.30 x10 3/uL      Hgb 13.0 g/dL      Hematocrit 01.0 %      Platelets 327 x10 3/uL      RBC 6.25 x10 6/uL      MCV 66.9 fL      MCH 20.8 pg      MCHC 31.1 g/dL      RDW 22 %      MPV 8.8 fL      Nucleated RBC 0.0 /100 WBC      Absolute NRBC 0.00 x10 3/uL     Comprehensive metabolic panel [[id number]]  (Abnormal) Collected: 10/08/19 1919    Specimen: Blood Updated: 10/08/19 1949     Glucose 119 mg/dL      BUN 14 mg/dL      Creatinine 0.9 mg/dL      Calcium 9.0 mg/dL      Sodium 034 mEq/L      Potassium 4.0 mEq/L      Chloride 101 mEq/L      CO2 25 mEq/L      Anion Gap 9.0     Protein, Total 6.8 g/dL      Albumin 3.7 g/dL      AST (SGOT) 11 U/L      ALT 14 U/L      Alkaline Phosphatase 82 U/L      Bilirubin, Total 0.8 mg/dL      Globulin 3.1 g/dL      Albumin/Globulin Ratio 1.2    GFR [[id number]] Collected:  10/08/19 1919     Updated: 10/08/19 1949     EGFR >60.0          Medications:     Current Facility-Administered Medications   Medication Dose Route Frequency    amLODIPine  5 mg Oral Daily    cyclobenzaprine  5 mg Oral TID    hydrALAZINE  50 mg Oral Q8H    levETIRAcetam  750 mg Oral Q12H SCH    traZODone  50 mg Oral QHS       Medication Review  1. A complete  drug regimen review was completed: YES  2. Were drug issues were found during review: NO   If yes to drug issues found during review:   What was the issue?:    What was the time the issue was identified?:    Was I contacted and action was taken by midnight of the next calendar day once issue was identified?:    Person who contacted me:    Action taken:       Assessment/Plan: 64 y.o. male  with dysfunction of mobility/ ADL due to Cerebrovascular accident (CVA) of right thalamus  Continue comprehensive intensive inpatient rehab program. Medically stable. Continue current management.     At least 15 minutes was spent with the patient in total.     Letta Moynahan, MD  PM&R

## 2019-10-10 LAB — MRSA CULTURE
Culture MRSA Surveillance: NEGATIVE
Culture MRSA Surveillance: NEGATIVE

## 2019-10-10 MED ORDER — CYCLOBENZAPRINE HCL 10 MG PO TABS
5.0000 mg | ORAL_TABLET | Freq: Three times a day (TID) | ORAL | Status: DC | PRN
Start: 2019-10-10 — End: 2019-10-15

## 2019-10-10 NOTE — Rehab Evaluation ([facility]) (Addendum)
Michael White  MRN: [id number]  Account: [id number]  Session Start: 10/10/2019 8:00:00 AM  Session Stop: 10/10/2019 9:00:00 AM    Total Treatment Minutes:  Minutes    Occupational Therapy  Inpatient Rehabilitation Evaluation    Rehab Diagnosis: revealed stable R thalamus hemorrhagic infarct.  Demographics:            Age: 64Y            Gender: Male  Primary Language: English    Past Medical History: Past Medical History:  ? 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  ? Borderline hypertension    on meds in past; BP normal without meds recently  Past Surgical History:  ? CARDIAC ABLATION   04/15/2013    ablation of slow pathway for AVNRT  History of Present Illness: 64 y.o. male with past medical history significant  of hypertension who has not seen any physician for many years what brought into  emergency room by paramedics when he was found confused by his coworker.  CT of  the head done in emergency room showed acute hemorrhagic infarction in the right  thalamus.  At present patient is awake alert oriented to self however has been  intermittently confused.  He was admitted to the ICU, placed on Cardene drip for hypertensive urgency.  Neurosurgery and neurology were consulted. Etiology of ICH is suspected to be  due to hypertension. Neurosurgery recommends no indication for neurosurgical  intervention.  Weaned off Cardene drip.  Mental status has improved. Patient  reports generalized pain otherwise no specific complaints. Patient will be  started on Keppra for 7 days Blood pressure control goal below 140 systolic  patient was a started on hydralazine and amlodipine with good blood pressure  response.  patient is A/T/O X3 and following commands, his last BM was 5/4, he is passing  gas. Patient is tolerating a mechanical soft diet, he is working with PT, OT and  SLP, he had an external foley. Patient would benefit from  inpatient AR before  going home with his family. Covid as negative 5/3.   Date of Onset: 10/04/19   Date of Admission: 10/08/2019 4:57:00 PM  Premorbid Functional Level: Pt was ind w ADLs, IADL s PTA, including functional  ambulation without AD.  Social/Educational History: Pt is single, has 2 children who both live in [facility].  Home Environment: Pt reports he and his spouse (not his legal wife) are in a  room on the first floor.  Per pt report, full bathroom is up a flight of stairs.   Pt is partially the caregiver for his spouse who has (per patient) dementia.  Spouse's daughter is the primary caregiver, and is also the mother of six  children ages 45-14.  Patient lives with his spouse, her daughter, son-in-law and  their six children.  Per pt  he has an adult son in [place] who is visiting this  weekend.  Unknown if there are steps to enter the home.    Medications and Allergies: Significant rehabilitation considerations:   pls refer to [facility] for details  Rehabilitation Precautions/Restrictions:   Risk of aspiration/swallowing. Mechanical soft   Risk for falls    SUBJECTIVE  Patient Report: " this stroke really did me in"  Patient/Caregiver Goals:  Patient's functional goals: " I want to get stronger  and go home to my family, but I can't go back to work  now"  Pain: Patient currently without complaints of pain.  Pain Reassessment: Pain was not reassessed as no pain was reported.    OBJECTIVE  General Observation: Orders noted chart reviewed evaluation completed.  Appreciate hand off from RN, re ; no new medical concerns. pt in bed set up for  breakfast upon OT s arrival.  Understanding of Current Condition: Pt is aware he had a stroke.  Pt feels like  it has been a massive impact on him.  Pt with very slow processing .  Vital Signs:                       Before Activity              After Activity  Vitals  Position/Activity    seated                       seated  BP Systolic          117                          127  BP  Diastolic         70                           80  Pulse                100                          76    Cognition: Pt is A x O x 3. Pt initially asking orientation questions as to name  of hospital, recalled that information as well as OT s name within session. pt  presents w slow processing, slow movement, inability to self monitor for  situational awareness without external structure. Pt is able to provide  information regarding his family, job  and recent medical events. Noted some  stuttering about 15 % of the time during evaluation. For more detailed  information pls refer to SLP evaluation.  Vision:  pt wears reading glasses, reports no new visual deficits, however given  pt's diagnosis, recommend vision screen during subsequent OT sessions. Unable to  complete today due to time constraints.  Perception: noted mod L inattention of body, with occasional spontaneous  attempts of use of L UE. pt needed vc and tactile cues to move L foot during  SPT.    Upper Extremity Status   Tone: B UE normal tone   Range of Motion: R UE AROM WFL anti gravity  L UE AROM anti gravity shoulder flexion 80 degrees, AAROM WFL   L UE elbow flexion/extension WFL  L hand slowed movement patterns,  impaired FMC    L hand is gross A level  on FUEL scale   Fine Motor: L hand FMC impaired. on FUEL scale gross A level. pt has delayed  motor reactions, and L inattention      Strength/Motor Control: Pt requires mod  A for supine to sit to supine w  increased time due to delayed processing speed. Pt is able to sit unsupported at  EOB w L lateral lean. SPT transfer w mod A due to L inattention to body  Sensation: L UE impaired proprioception, light touch. intact for temperature    Interventions:  High Complexity Evaluation: An occupational  profile and medical and therapy  history, which includes review of medical and/or therapy records and extensive  additional review of physical, cognitive, or psychosocial history related to  current  functional performance  An assessment(s) that identify 5 or more performance deficits (i.e., relating to  physical, cognitive, or psychosocial skills) that result in activity limitations  and/or participation restrictions  Patient presents with co morbidities that affect occupational performance.  Significant modification of tasks or assistance (i.e., physical or verbal) with  assessment(s) is necessary to enable patient to complete evaluation component  Clinical decision-making is of high analytic complexity, which includes an  analysis of the patient profile, analysis of data from comprehensive  assessment(s), and consideration of multiple treatment options No treatment  provided today.  Patient was handed off to next therapist. Handoff to nurse completed.    Interdisciplinary Educational Needs and Learning Preferences:       Learning Preference: The patient's preferred learning method is:  Explanation.       Barriers to Learning: Cognitive limitations.       Learning Needs: Equipment, Functional activities/mobility, Plan of care,  Precautions, Stroke    Education Provided: Plan of care.       Audience: Patient.       Mode: Explanation.       Response: Verbalized understanding.    ASSESSMENT  Summary of Deficits and Prognosis: Mr. Oshita presents to AR s/p R thalamic  infarct. PTA, pt was ind with ADLs, IADL s, including caring for his spouse and  working as a Scientist, forensic. Currently pt presents w L inattention, impaired midline  orientation, impaired functional use of L hemi body, delayed processing and  situational awareness.  As a result pt requires assistance with all aspects of  ADL s and functional mob. Pt will benefit from skilled OT in an  interdisciplinary setting to address deficits and work towards goals and  identify safe [place] plan.  Rehab Potential: Able to participate in an intensive inpatient interdisciplinary  rehabilitation program, Good premorbid functional status, Living in the  community premorbidly,  Motivated  Barriers to Progress/Discharge: Architectural barriers in home, Functional  status    Short Term Goals:  Time frame to achieve short term goal(s):   1 week from evaluation 10/09/19       1. Pt will complete UB dressing, bathing w min  A  in a timely manner and  mod external structure .       2. Pt will complete LB bathing, dressing at a mod A level.       3. Pt will use L UE at a semi functional level ( FUEL scale) 50 % of the  time during ADL s with no more than 2 vc s.       4. Pt will consistently complete functional transfers at min A level w  LRAD.  Long Term Goals:   Time frame to achieve long term goal(s):   2-3 weeks from eval 10/09/19       1. Pt will utilize L UE at a Functional Assist level (FUEL scale) during  ADL s in order to complete UB dressing at a  mod I level.       2. Pt will utilize L UE at a Functional Assist level (FUEL scale) during  ADL s in order to complete LB bathing, dressing at a supervision level.       3. Pt will demonstrate improved midline orientation in order to complete  functional transfers at  supervision level w RLAD.       4. Pt will complete toileting including transfers at a supervision level w  RLAD>       5. Pt's family/care giver will demonstrate ability to provide recommended  supervision assistance and safe environment in order to return home.    CARE Tool  The scores below reflect the patient's usual function prior to therapeutic  intervention.    Self-Care Functional Assessment  Eating: 04 - Supervision: Helper provides supervision for safety or verbal cues  ONLY  Assistive Device(s):  None    Oral Hygiene: 39 - Not attempted due to medical or safety concerns.   unable to stand to complete task due to  por midline orientation and L  inattention, poor processing speed . seated completes at min A level needing  external structure and physical A to incorporate L UE    Toileting Hygiene: 02 - Substantial/Maximal Assistance: Helper does 51-75% of  the  activity  Toileting Equipment:   Scientist, research (physical sciences) Device(s):   Grab bar    Shower/Bathe Self: 03 - Partial/Moderate Assistance: Helper does 26-50% of the  activity  Location:  Seated at sink  Assistive Device(s):   None    Upper Body Dressing:   10 - Not attempted due to environmental limitations.   no shirt available    Lower Body Dressing: 02 - Substantial/Maximal Assistance: Helper does 76-99% of  the activity  Assistive Device(s):   None    Putting On/Taking Off Footwear: 01 - Dependent: Helper does 100% of the activity    Assistive Device(s):   None    Mobility Functional Assessment  Roll Left and Right: 02 - Substantial/Maximal Assistance: Helper does 51-75% of  the activity  Assistive Device(s):   None    Sit to Lying: 02 - Substantial/Maximal Assistance: Helper does 51-75% of the  activity  Assistive Device(s):   None    Lying to Sitting on Side of Bed: 02 - Substantial/Maximal Assistance: Helper  does 51-75% of the activity  Assistive Device(s):   None   increased time    Sit to Stand: 03 - Partial/Moderate Assistance: Helper does 26-50% of the  activity  Assistive Device(s):   None    Chair/Bed-to-Chair Transfer: 02 - Substantial/Maximal Assistance: Helper does  51-75% of the activity  Assistive Device(s):   Arm rest    Toilet Transfer: 02 - Substantial/Maximal Assistance: Helper does 51-75% of the  activity  Assistive Device(s):   None    Picking Up Objects: 88 - Not attempted due to medical or safety concerns.    Self Care Discharge Goals:   Eating: Patient completed the activities by him/herself with no assistance from  a helper.   Oral Hygiene: Patient completed the activities by him/herself with no  assistance from a helper.   Toileting Hygiene: Helper provides verbal cues or touching/steadying assistance  as patient completes activity.   Shower/Bathe Self: Helper provides verbal cues or touching/steadying assistance  as patient completes activity.   Upper Body Dressing: Patient completed the  activities by him/herself with no  assistance from a helper.   Lower Body Dressing: Helper provides verbal cues or touching/steadying  assistance as patient completes activity.   Putting On/Taking Off Footwear: Helper provides verbal cues or  touching/steadying assistance as patient completes activity.      Risks/Benefits of Rehabilitation Discussed with Patient/Caregiver: Yes.  Recommendations/Goals for Rehabilitation Discussed with Patient/Caregiver: Yes.    PLAN  Occupational Therapy Plan: Occupational Therapy is recommended.  Recommended Frequency/Duration/Intensity: 60-120 min per day, 5-6 times per week  1:1 treat and group PRN  Activities Contributing Toward Care Plan: Recommended  Frequency/Duration/Intensity: ther act, ther ex, ADL, IADL retraining,  functional transfer, mob for ADL s, modalities, NMRE, pt and family education,  and training,  stroke education, DME, D.C planning  Activities Contributing Toward Care Plan:  The patient is not appropriate for group treatment.    Team Care Plan  Please review Integrated Patient View Care Plan Flowsheet for Team identified  Problems, Interventions, and Goals.    Identified problems from team documentation:      Identified problems from this assessment:     Self Care Management : Pt will complete self care at a supervise/CGA level  provided by family.care giver in order to return home safely.    Discipline:  Occupational Therapy    3 Hour Rule Minutes: 60 minutes of OT treatment this session count towards  intensity and duration of therapy requirement. Patient was seen for the full  scheduled time of OT treatment this session.  Therapy Mode Minutes: Individual: 60 minutes.    Deleted by: Milinda Antis, OT/L, OTD, SCLV(Date/Time of charting is incorrect.)

## 2019-10-10 NOTE — Rehab Progress Note ([facility]) (Signed)
[name]  MRN: [id number]  Account: [id number]  Session Start: 10/10/2019 12:00:00 AM  Session Stop: 10/10/2019 12:00:00 AM    Total Treatment Minutes:  Minutes    Rehabilitation Nursing  Inpatient Rehabilitation Shift Assessment    Rehab Diagnosis: revealed stable R thalamus hemorrhagic infarct.  Demographics:            Age: 5Y            Gender: Male  Primary Language: English    Date of Onset:  10/04/19  Date of Admission: 10/08/2019 4:57:00 PM    Rehabilitation Precautions Restrictions:   Risk of aspiration/swallowing. Mechanical soft   Risk for falls    Patient Report: " I need some desert."  Patient/Caregiver Goals:  " I want to get better and walk safely again."    Wounds/Incisions: No wounds or incisions.    Medication Review: No clinically significant medication issues identified this  shift.    Bowel and Bladder Output:                       Bladder (# only)             Bowel (# only)  Number of Episodes  Continent            1                            0  Incontinent          0                            0    Additional Education Provided:    Education Provided: Safety issues and interventions. Fall protocol. Supervision  requirements. Medication. Name and dosage. Administration. Purpose. Side  Effects.       Audience: Patient.       Mode: Explanation.       Response: Verbalized understanding.    TEAM CARE PLAN  Identified problems from team documentation:  Problem: Impaired Cognition  Cognition: Primary Team Goal: Patient will demonstrate adequate attention and  visual / verbal processing to complete ADLs and mild / moderately complex  cognitive tasks for periods of 20 minutes provided min redirections./Active    Problem: Impaired Mobility  Mobility: Primary Team Goal: Pt will perform majority of mobility at a  supervision level with most appropriate assistive device for increased safety  and indepednence upon discharge./Active    Problem: Impaired Swallowing  Swallowing: Primary Team Goal: Patient  will incorporate swallow strategies to  maintain adequate mastication and functional oral clearance in order to safely  tolerate regular texture/thin liquid diet in 100% opportunities mod I./Active    Please review Integrated Patient View Care Plan Flowsheet for Team identified  Problems, Interventions, and Goals.    Signed by: Tamala Ser, RN 10/10/2019 2:19:00 AM

## 2019-10-10 NOTE — Rehab Progress Note ([facility]) (Signed)
Michael White  MRN: [id number]  Account: [id number]  Session Start: 10/10/2019 12:00:00 AM  Session Stop: 10/10/2019 12:00:00 AM    Total Treatment Minutes:  Minutes    Rehabilitation Nursing  Inpatient Rehabilitation Shift Assessment    Rehab Diagnosis: revealed stable R thalamus hemorrhagic infarct.  Demographics:            Age: 39Y            Gender: Male  Primary Language: English    Date of Onset:  10/04/19  Date of Admission: 10/08/2019 4:57:00 PM    Rehabilitation Precautions Restrictions:   Risk of aspiration/swallowing. Mechanical soft   Risk for falls    Patient Report: " I am fine "  Patient/Caregiver Goals:  " I want to get better and walk safely again."    Wounds/Incisions: No wounds or incisions.    Medication Review: No clinically significant medication issues identified this  shift.    Bowel and Bladder Output:                       Bladder (# only)             Bowel (# only)  Number of Episodes  Continent            4                            0  Incontinent          2                            0    Additional Education Provided:    Education Provided: Safety.       Audience: Patient.       Mode: Explanation.       Response: Needs practice.    TEAM CARE PLAN  Identified problems from team documentation:  Problem: Impaired Cardiac Function  Cardiac: Primary Team Goal: Patient will display vital signs within  limits./Active  Cardiac: Additional Team Goal: Patient and caregiver will teack-back  BEFAST./Active    Problem: Impaired Cognition  Cognition: Primary Team Goal: Patient will demonstrate adequate attention and  visual / verbal processing to complete ADLs and mild / moderately complex  cognitive tasks for periods of 20 minutes provided min redirections./Active    Problem: Impaired Mobility  Mobility: Primary Team Goal: Pt will perform majority of mobility at a  supervision level with most appropriate assistive device for increased safety  and indepednence upon discharge./Active    Problem:  Impaired Self-care Mgmt/ADL/IADL  Self Care: Primary Team Goal: Pt will complete self carea at a superivsio/CGA  level provided by family.care giver in order to return home safely./Active    Problem: Impaired Swallowing  Swallowing: Primary Team Goal: Patient will incorporate swallow strategies to  maintain adequate mastication and functional oral clearance in order to safely  tolerate regular texture/thin liquid diet in 100% opportunities mod I./Active    Problem: Safety Risk and Restraint  Safety: Primary Team Goal: Patient will display learned safety  precautions./Active    Please review Integrated Patient View Care Plan Flowsheet for Team identified  Problems, Interventions, and Goals.    Signed by: Zorita Pang, RN 10/10/2019 5:02:00 PM

## 2019-10-10 NOTE — Rehab Progress Note ([facility]) (Signed)
NAMECHAEL URENDA  MRN: [id number]  Account: [id number]  Session Start: 10/10/2019 9:00:00 AM  Session Stop: 10/10/2019 10:15:00 AM    Total Treatment Minutes: 75.00 Minutes    Physical Therapy  Inpatient Rehabilitation Progress Note - Brief    Rehab Diagnosis: revealed stable R thalamus hemorrhagic infarct.  Demographics:            Age: 51Y            Gender: Male  Rehabilitation Precautions/Restrictions:   Risk of aspiration/swallowing. Mechanical soft   Risk for falls    SUBJECTIVE  Patient Report: I just can't believe how much I took for granted before and how  much harder everything is now.  Pain: Patient currently without complaints of pain.    OBJECTIVE  Vital Signs:                       Before Activity              After Activity  Vitals  BP Systolic          131                          -  BP Diastolic         79                           -  Pulse                71                           -  O2 Saturation        94                           -    Interventions:       Therapeutic Activities:  Session focused on Care Tool mobility assessment  as pt was very fatigued during initial eval. Pt received supine HOB raised  attempting to use urinal and requesting to stand. RN attending as well. Pt  assisted with bed mobility and standing for urination attempt (see CARE TOOL  below). Pt with c/o of cold, noted to be L leaning in sitting and standing. Pt  also practiced seated and standing balance not reflected in CARE TOOL  assessment. Pt assisted to midline in sitting and able to maintain. Pt provided  frequent vc throughout session in sitting and standing to find midline,  successful about 50% of the time, usually requires assist.  Patient was left seated in chair in his/her room. Handoff to nurse completed.  Chair alarm in place and activated.  Oriented to call bell and placed within reach.  Personal items within reach.  Assistive devices positioned out of reach.  Pain Reassessment: Pain was not reassessed as no  pain was reported.    Education Provided:    Education Provided: Plan of care. Rehab techniques and procedures. Pt educated  regarding purpose of rehab to retrain brain for improved motor control. Fall  prevention/balance training. Stroke. Stroke recovery       Audience: Patient.       Mode: Explanation.       Response: Needs reinforcement.    ASSESSMENT  Pt expressed frustration with change in mobility but understands deficits and  appreciates  need for safety and process of recovery. Pt moves very slowly but  requires minimal assist with good cueing and time to process. Pt demonstrates  good strength without knee buckling. Pt demonstrates L lean n both sitting and  standing but demonstrated inconsistent ability to self correct. Pt gait is very  slow with short step length, almost Parkinsonian. Pt would benefit from ongoing  skilled PT to improve midline attainment, static/dynamic balance, gait training,  and overall NMRE to L hemibody.    CARE Tool  Mobility Functional Assessment    Lying to Sitting on Side of Bed: 03 - Partial/Moderate Assistance: Helper does  26-50% of the activity  Assistive Device(s):   Bed rail   2nd person standing by.    Sit to Stand: 03 - Partial/Moderate Assistance: Helper does 1-25% of the  activity  Assistive Device(s):   Rolling walker   Pt benefits from vc for good setup.    Chair/Bed-to-Chair Transfer: 03 - Partial/Moderate Assistance: Helper does 1-25%  of the activity  Assistive Device(s):   Rolling walker   Very slow, multiple tiny steps and heavy vc.    Car Transfer: 03 - Partial/Moderate Assistance: Helper does 1-25% of the  activity   Very slow, assist for steadying and lift. Pt able to manage BLE.    Walk 10 Feet: 03 - Partial/Moderate Assistance: Helper does 1-25% of the  activity   For Lift but no sign of buckling. Very slow cadence and tiny step length,  almost Parkinsonian.      PLAN  Continued Physical Therapy is recommended.  Recommended Frequency/Duration/Intensity:  60-120 minutes per day, 5-6 days per  week for 3 weeks from initial evaluation on 10/09/19.  Activities Contributing Toward Care Plan: therapeutic exercise, therapeutic  activity, balance training, bed mobility training, strength training, gait  training, stair negotiation training, DME assessment, discharge planning in a  1:1 or small group setting in a multidiscplinary environment.    3 Hour Rule Minutes: 75 minutes of PT treatment this session count towards  intensity and duration of therapy requirement. Patient was seen for the full  scheduled time of PT treatment this session.  Therapy Mode Minutes: Individual: 75 minutes.    Signed by: Alberteen Spindle, PTA 10/10/2019 10:15:00 AM

## 2019-10-11 LAB — BASIC METABOLIC PANEL
Anion Gap: 11 (ref 5.0–15.0)
BUN: 13 mg/dL (ref 9–28)
CO2: 25 mEq/L (ref 22–29)
Calcium: 9.2 mg/dL (ref 8.5–10.5)
Chloride: 100 mEq/L (ref 100–111)
Creatinine: 0.9 mg/dL (ref 0.7–1.3)
Glucose: 93 mg/dL (ref 70–100)
Potassium: 4.6 mEq/L (ref 3.5–5.1)
Sodium: 136 mEq/L (ref 136–145)

## 2019-10-11 LAB — CBC
Absolute NRBC: 0 10*3/uL (ref 0.00–0.00)
Hematocrit: 44 % (ref 37.6–49.6)
Hgb: 13.6 g/dL (ref 12.5–17.1)
MCH: 20.8 pg — ABNORMAL LOW (ref 25.1–33.5)
MCHC: 30.9 g/dL — ABNORMAL LOW (ref 31.5–35.8)
MCV: 67.4 fL — ABNORMAL LOW (ref 78.0–96.0)
MPV: 9.4 fL (ref 8.9–12.5)
Nucleated RBC: 0 /100 WBC (ref 0.0–0.0)
Platelets: 371 10*3/uL — ABNORMAL HIGH (ref 142–346)
RBC: 6.53 10*6/uL — ABNORMAL HIGH (ref 4.20–5.90)
RDW: 22 % — ABNORMAL HIGH (ref 11–15)
WBC: 7.25 10*3/uL (ref 3.10–9.50)

## 2019-10-11 LAB — GFR: EGFR: >60

## 2019-10-11 MED ORDER — LIDOCAINE(URO-JET) 2% JELLY (WRAP)
CUTANEOUS | Status: DC | PRN
Start: 2019-10-11 — End: 2019-10-21
  Filled 2019-10-11 (×3): qty 10

## 2019-10-11 NOTE — Rehab Progress Note ([facility]) (Signed)
NAMEMONTERRIUS CARDOSA  MRN: [id number]  Account: [id number]  Session Start: 10/11/2019 2:20:00 PM  Session Stop: 10/11/2019 3:00:00 PM    Total Treatment Minutes: 40.00 Minutes    Physical Therapy  Inpatient Rehabilitation Progress Note    Rehab Diagnosis: revealed stable R thalamus hemorrhagic infarct.  Demographics:            Age: 57Y            Gender: Male  Rehabilitation Precautions/Restrictions:   Risk of aspiration/swallowing. Mechanical soft  Risk for falls    SUBJECTIVE  Patient Report: "I was the one telling my wife to reach back for the chair, I  never thought I was going to have to have someone remind me to reach back."  Patient/Caregiver Goals:  " I want to get better and walk safely again."  Pain: Patient currently without complaints of pain.    OBJECTIVE  Vital Signs:                       Before Activity              After Activity  Vitals  BP Systolic          143                          -  BP Diastolic         84                           -  Pulse                97                           -      Interventions:       Therapeutic Activities:  Pt in bed upon PT arrival, RN finishing with  bladder scan and in and out cath.  Pt agreeable to therapy session, pt's son  present throughout to observe.  With increased time pt comes to sitting EOB with  S and completes squat pivot transfer to w/c with Min A; once in w/c he reports  need to urinate (bladder did not empty with cath and bladder scan reports just  over volume).  Pt is given urinal and is able to position it and void a  small amount.  He practices sit to stand with RW with min A, needing increased  time to correct posterior LOB but able to maintain once forward weight shift is  achieved.  He practices gait training approx 15' x 1 and 10' x 1 each with CGA  using RW, with increased time and frequent cues for big steps.  Pt transfers  back to bed at end of session with Min A to pivot and S with increased time to  attain R side lying.  Patient  was returned to or left in bed. Handoff to nurse completed.  Bed alarm in place and activated.  Oriented to call bell and placed within reach.  Personal items within reach.    Pain Reassessment: Pain was not reassessed as no pain was reported.    Lower Extremity Orthosis: Patient does not present with foot drop or ankle  instability and does not need an orthotic.  Equipment Provided: None at this time.    Education Provided:  Education Provided: Stroke. What is stroke, Types of stroke, Signs /T/ Symptoms  associated with stroke, Etiology of stroke, Anatomy/Physiology of the brain,  Control blood pressure, high cholesterol and/or diabetes, Stroke recovery       Audience: Patient and son .       Mode: Explanation.       Response: Verbalized understanding.  Needs reinforcement.    ASSESSMENT  Pt remains fatigued today, but is much more able to participate in functional  tasks today.  His processing is significantly delayed however with enough time  is able to perform basic mobility tasks with little physical assistance.    CARE Tool  Mobility Functional Assessment  Roll Left and Right: 04 - Supervision: Helper provides supervision for safety or  verbal cues ONLY  Assistive Device(s):   Bed rail   increased time    Sit to Lying: 04 - Supervision: Helper provides supervision for safety or verbal  cues ONLY  Assistive Device(s):   Bed rail   increased time    Lying to Sitting on Side of Bed: 04 - Supervision: Helper provides supervision  for safety or verbal cues ONLY  Assistive Device(s):   Bed rail   increased time    Sit to Stand: 03 - Partial/Moderate Assistance: Helper does 1-25% of the  activity  Assistive Device(s):   Rolling walker    Chair/Bed-to-Chair Transfer: 03 - Partial/Moderate Assistance: Helper does 1-25%  of the activity  Assistive Device(s):   Arm rest, Bed rails    Car Transfer: 88 - Not attempted due to medical or safety concerns.    Walk 10 Feet: 04 - Touching Assistance: Helper  provides  touching/steadying/contact guard   using RW    Walk 50 Feet with Two Turns: 88 - Not attempted due to medical or safety  concerns.    Walk 150 Feet:  88 - Not attempted due to medical or safety concerns.    Walking 10 Feet on Uneven Surface: 88 - Not attempted due to medical or safety  concerns.    1 Step (curb):  88 - Not attempted due to medical or safety concerns.    4 Steps:  45 - Not attempted due to medical or safety concerns.    12 Steps: 60 - Not attempted due to medical or safety concerns.    Wheelchair: Patient uses a wheelchair and/or scooter  Wheel 50 Feet with Two Turns:  01 - Dependent: Helper does 100% of the activity  Type of Wheelchair/Scooter Used:   Manual    Wheel 150 Feet:  01 - Dependent: Helper does 100% of the activity  Type of Wheelchair/Scooter Used:   Manual    Long Term Goals: Pt will be independent with bed mobility on a flat bed without  bed rails for increased safety and independence upon discharge.  Pt will perform stand pivot transfers with most appropriate assistive device  with supervision for increased safety and independence upon discharge.  Pt will ambulate 150 supervision with most appropriate assistive device for  increased safety and independence upon discharge.  Pt will ascend/descend 12 stairs with one rail supervision for increased safety  and independence upon discharge.  Pt and his family will participate in education/training to allow for a safe  discharge.  3 weeks from initial evaluation on 10/09/19.  Short Term Goals: Pt will perform supine to sit with head of bed raised with  supervision and use of bed rail.  Pt will perform stand pivot transfer with RW  with CGA from bed to wheelchair.  Pt will ambulate 25' with min A using RW.  Pt will ascend/descend 4 stairs with min A and B UE on rails.  1 week from initial evaluation on 10/09/19.    Progress Towards Goals: Pt completing bed mobility with S and increased time,  transfers and walking 15' CG/Min A using RW.   Pt is negatively impacted by very  delayed processing.    PLAN  Continued Physical Therapy is recommended.  Recommended Frequency/Duration/Intensity: 60-120 minutes per day, 5-6 days per  week for 3 weeks from initial evaluation on 10/09/19.  Activities Contributing Toward Care Plan: therapeutic exercise, therapeutic  activity, balance training, bed mobility training, strength training, gait  training, stair negotiation training, neuro re-ed, functional endurance, stroke  education, DME assessment, discharge planning in a 1:1 or small group setting in  a multidisciplinary environment.  The patient is not appropriate for group treatment.    Care Plan  Identified problems from team documentation:  Problem: Impaired Cardiac Function  Cardiac: Primary Team Goal: Patient will display vital signs within  limits./Active  Cardiac: Additional Team Goal: Patient and caregiver will teach-back  BEFAST./Active    Problem: Impaired Cognition  Cognition: Primary Team Goal: Patient will demonstrate adequate attention and  visual / verbal processing to complete ADLs and mild / moderately complex  cognitive tasks for periods of 20 minutes provided min redirections./Active    Problem: Impaired Mobility  Mobility: Primary Team Goal: Pt will perform majority of mobility at a  supervision level with most appropriate assistive device for increased safety  and independence upon discharge./Active    Problem: Impaired Self-care Mgmt/ADL/IADL  Self Care: Primary Team Goal: Pt will complete self care at a supervision/CGA  level provided by family.care giver in order to return home safely./Active    Problem: Impaired Swallowing  Swallowing: Primary Team Goal: Patient will incorporate swallow strategies to  maintain adequate mastication and functional oral clearance in order to safely  tolerate regular texture/thin liquid diet in 100% opportunities mod I./Active    Problem: Safety Risk and Restraint  Safety: Primary Team Goal: Patient will display  learned safety  precautions./Active    Add/Update Problems from this Treatment:  Update of existing problem:   Mobility: Pt with very delayed processing, needing significantly increased time  with all mobility.  When given adequate time he is able to complete bed mobility  with S, stand and transfer with Min A, and ambulate 15' with CGA using RW.    Discipline:  Physical Therapy    Please review Integrated Patient View Care Plan Flowsheet for Team identified  Problems, Interventions, and Goals.    3 Hour Rule Minutes: 40 minutes of PT treatment this session count towards  intensity and duration of therapy requirement. Patient was not seen for the full  scheduled time of PT treatment this session.   RN performing bladder scan and in and out cath  Will attempt to see patient tomorrow,  as scheduled  Therapy Mode Minutes: Individual: 40 minutes.    Signed by: Jaquita Rector, PT 10/11/2019 3:00:00 PM

## 2019-10-11 NOTE — Assessment & Plan Note (Addendum)
Corrected 10/11/2019 9:34:24 AM    NAME: Michael White  MRN: [id number]  Account: [id number]  Session Start: 10/11/2019 12:00:00 AM  Session Stop: 10/11/2019 12:00:00 AM    Total Treatment Minutes:  Minutes    Nutrition  Inpatient Rehabilitation Initial Assessment - Limited    Rehab Diagnosis: revealed stable R thalamus hemorrhagic infarct.  Demographics:            Age: 64Y            Gender: Male  Primary Language: English    Past Medical History: Past Medical History:  ? 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  ? Borderline hypertension    on meds in past; BP normal without meds recently  Past Surgical History:  ? CARDIAC ABLATION   04/15/2013    ablation of slow pathway for AVNRT  History of Present Illness: 64 y.o. male with past medical history significant  of hypertension who has not seen any physician for many years what brought into  emergency room by paramedics when he was found confused by his coworker.  CT of  the head done in emergency room showed acute hemorrhagic infarction in the right  thalamus.  At present patient is awake alert oriented to self however has been  intermittently confused.  He was admitted to the ICU, placed on Cardene drip for hypertensive urgency.  Neurosurgery and neurology were consulted. Etiology of ICH is suspected to be  due to hypertension. Neurosurgery recommends no indication for neurosurgical  intervention.  Weaned off Cardene drip.  Mental status has improved. Patient  reports generalized pain otherwise no specific complaints. Patient will be  started on Keppra for 7 days Blood pressure control goal below 140 systolic  patient was a started on hydralazine and amlodipine with good blood pressure  response.  patient is A/T/O X3 and following commands, his last BM was 5/4, he is passing  gas. Patient is tolerating a mechanical soft diet, he is working with PT, OT and  SLP, he had an  external foley. Patient would benefit from inpatient AR before  going home with his family. Covid as negative 5/3.            Date of Onset: 10/04/19            Date of Admission: 10/08/2019 4:57:00 PM    Medications and Allergies: Significant rehabilitation considerations:   Please see EPIC.  Rehabilitation Precautions/Restrictions:   Risk of aspiration/swallowing. Mechanical soft   Risk for falls    SUBJECTIVE  Patient Reports: S/p CVA. PMHx: borderline HTN. BP: 154/74 on 5/10. Urinary  retention per RN. Pt states that his appetite is not good and consumes 50% of  meals. He loves Ensure abd drinks it. He states that his weight has been  fluctuating but it is around 130 lbs. He can feed himself by using his arms per  pt. Skin is intact per flowsheets.  Social History: Unknown, pt has worked at a [employer] for 20+ years  Patient/Caregiver Goals:  Patient's functional goals: " I want to get better and  walk safely again."    OBJECTIVE  Labs:   Results for DANARIUS, MCCONATHY SR. (MRN [id number]) as of 10/11/2019 09:28    10/11/2019 06:01  Glucose: 93  BUN: 13  Creatinine: 0.9  Sodium: 136  Potassium: 4.6  Chloride: 100  CO2: 25  Calcium: 9.2  Anion Gap: 11.0  EGFR: >60.0  Weight: 144 pounds. 65.45 kilograms.  Height:  71 inches. 1.8 meters.  BMI: 20.2 kilogram per meters squared.  Weight Group:  Normal weight    Admission Weight: 144  Usual Weight: 130 pounds.    Food Allergies/Intolerances: No known food allergies.  Religious/Cultural Food Practices: No  Current Medical/Nutrition Therapy: Diet: MS and Ensure TID   diet with no liquid consistency restrictions.    % of Meals Consumed: 50% of meals and 3 Ensure daily per pt %  Feeding Modality and Dentition: Oral  Current Problems/GI Symptoms: No Current GI Problems Noted.  Wounds/Incisions:  Absent    ASSESSMENT  Overall Assessment of Nutritional Status:  Current Nutrition Intake: Adequate.  Current Nutrition Status: Mildly compromised.  Nutritional Risk Assessment:  No  nutritional risk present.  Nutrition Diagnosis: No nutrition problems currently.    Interventions Provided/Recommendations:   Assessment Completed.  Education/Discharge Planning Needs: Not applicable.    PLAN  Recommendations for Follow-up: 1.) MS + 2 g sodium diet. 2.) Continue Ensure  (strawberry) TID. 3.) Follow up within 14-21 days.    Care Plan  Identified problems from team documentation:  Problem: Impaired Cardiac Function  Cardiac: Primary Team Goal: Patient will display vital signs within  limits./Active  Cardiac: Additional Team Goal: Patient and caregiver will teack-back  BEFAST./Active    Problem: Impaired Cognition  Cognition: Primary Team Goal: Patient will demonstrate adequate attention and  visual / verbal processing to complete ADLs and mild / moderately complex  cognitive tasks for periods of 20 minutes provided min redirections./Active    Problem: Impaired Mobility  Mobility: Primary Team Goal: Pt will perform majority of mobility at a  supervision level with most appropriate assistive device for increased safety  and indepednence upon discharge./Active    Problem: Impaired Self-care Mgmt/ADL/IADL  Self Care: Primary Team Goal: Pt will complete self carea at a superivsio/CGA  level provided by family.care giver in order to return home safely./Active    Problem: Impaired Swallowing  Swallowing: Primary Team Goal: Patient will incorporate swallow strategies to  maintain adequate mastication and functional oral clearance in order to safely  tolerate regular texture/thin liquid diet in 100% opportunities mod I./Active    Problem: Safety Risk and Restraint  Safety: Primary Team Goal: Patient will display learned safety  precautions./Active    Identified problems from this assessment:     No problems identified at this time.    Please review Integrated Patient View Care Plan Flowsheet for Team identified  Problems, Interventions, and Goals.    Signed by: Onalee Hua, RD 66/03/2020 9:32:00 AM

## 2019-10-11 NOTE — Rehab Progress Note ([facility]) (Signed)
[name]  MRN: [id number]  Account: [id number]  Session Start: 10/11/2019 12:00:00 AM  Session Stop: 10/11/2019 12:00:00 AM    Total Treatment Minutes:  Minutes    Rehabilitation Nursing  Inpatient Rehabilitation Shift Assessment    Rehab Diagnosis: revealed stable R thalamus hemorrhagic infarct.  Demographics:            Age: 21Y            Gender: Male  Primary Language: English    Date of Onset:  10/04/19  Date of Admission: 10/08/2019 4:57:00 PM    Rehabilitation Precautions Restrictions:   Risk of aspiration/swallowing. Mechanical soft   Risk for falls    Patient Report: "I'm okay"  Patient/Caregiver Goals:  " I want to get better and walk safely again."    Wounds/Incisions: No wounds or incisions.    Medication Review: No clinically significant medication issues identified this  shift.    Bowel and Bladder Output:                       Bladder (# only)             Bowel (# only)  Number of Episodes  Continent            1                            0  Incontinent          0                            0    Additional Education Provided:    Education Provided: Safety issues and interventions. Fall protocol. Fall  protocol. Impaired vision. Impaired coordination. Supervision requirements.  Functional transfers. Medication. Name and dosage. Administration. Purpose. Side  Effects. Cardiac. Monitoring heart rate. Monitoring blood pressure.       Audience: Patient.       Mode: Explanation.  Demonstration.       Response: Verbalized understanding.    TEAM CARE PLAN  Identified problems from team documentation:  Problem: Impaired Cardiac Function  Cardiac: Primary Team Goal: Patient will display vital signs within  limits./Active  Cardiac: Additional Team Goal: Patient and caregiver will teack-back  BEFAST./Active    Problem: Impaired Cognition  Cognition: Primary Team Goal: Patient will demonstrate adequate attention and  visual / verbal processing to complete ADLs and mild / moderately complex  cognitive tasks  for periods of 20 minutes provided min redirections./Active    Problem: Impaired Mobility  Mobility: Primary Team Goal: Pt will perform majority of mobility at a  supervision level with most appropriate assistive device for increased safety  and indepednence upon discharge./Active    Problem: Impaired Self-care Mgmt/ADL/IADL  Self Care: Primary Team Goal: Pt will complete self carea at a superivsio/CGA  level provided by family.care giver in order to return home safely./Active    Problem: Impaired Swallowing  Swallowing: Primary Team Goal: Patient will incorporate swallow strategies to  maintain adequate mastication and functional oral clearance in order to safely  tolerate regular texture/thin liquid diet in 100% opportunities mod I./Active    Problem: Safety Risk and Restraint  Safety: Primary Team Goal: Patient will display learned safety  precautions./Active    Please review Integrated Patient View Care Plan Flowsheet for Team identified  Problems, Interventions, and Goals.    Signed by: Chelsea Aus  Cyndie Chime, RN 10/11/2019 11:37:00 AM

## 2019-10-11 NOTE — Rehab Progress Note ([facility]) (Signed)
Michael White  MRN: [id number]  Account: [id number]  Session Start: 10/11/2019 11:00:00 AM  Session Stop: 10/11/2019 12:00:00 PM    Total Treatment Minutes: 60.00 Minutes    Speech Language Pathology  Inpatient Rehabilitation Progress Note - Brief    Rehab Diagnosis: revealed stable R thalamus hemorrhagic infarct.  Demographics:            Age: 66Y            Gender: Male  Rehabilitation Precautions/Restrictions:   Risk of aspiration/swallowing. Mechanical soft   Risk for falls    SUBJECTIVE  Patient Report: "Hi Ma'am"  Pain: Patient currently without complaints of pain.    OBJECTIVE    Interventions:       Speech Treatment: Session primarily focused on stroke education as pt  interested in learning about "the type of stroke" he had and results of  standardized cognitive-linguistic testing. Stroke education booklet procured and  placed in pt's [place] binder. Teachback effective in pt being able to recall 80%  of information, needed re-education on impact of his stroke on cognitive  functioning. Good awareness and insight noted as a result of education given pt  reporting: "This is a serious condition, I may not be able to get back to the  work I want to do". Multiple concerns about recovery and impact of stroke on  being able to return to PLOF including: length of stay, unable to resume role of  being the primary breadwinner for his family and navigating social security or  other federal aid sources to meet financial needs, worried about wife who has  Alzheimer's Dementia but acknowledges that dtr at home is currently caring for  wife. Session interrupted X 2 by family calls with pt engaging in 4-5 minute  conversations. Pacing information with frequent check-ins was an effective  strategy to manage impact of slowed processing on new learning.  L inattention: With verbal education re: impact of R thalamic CVA on L  attention, pt able to turn head to L and scan tray with 100% accuracy when  recognizing food  items.  Patient was returned to or left in bed. Handoff to nurse completed.  Oriented to call bell and placed within reach.  Personal items within reach.  Pain Reassessment: Pain was not reassessed as no pain was reported.    Education Provided:    Education Provided: Stroke. What is stroke, Types of stroke, Etiology of stroke,  Anatomy/Physiology of the brain,  cognitive sequelae of stroke       Audience: Patient.       Mode: Explanation.  Stroke ed booklet.       Response: Verbalized understanding.    ASSESSMENT  Pt will benefit from enrollment in stroke education class. Slowed processing and  ability to manage distractions (phone calls) impacting his ability to fully  attend to stroke education. Care team members recommended to continue to focus  on stroke education, highlight prognostic indicators of recovery to mitigate  pt's anxiety re: recovery    PLAN  Continued Speech Language Pathology is recommended to address:  Recommended Frequency/Duration/Intensity: 1:1 or group settings, 60 - 120  minutes/day, 5-6x/week for 2-3 weeks from IE on 5/8  Continued Activities Contributing Toward Care Plan: cognitive re-training,  cognitive compensatory strategy trianing, meal trial tray for diet advancement,  dysphagia strategy training, collaborative d/c planning, stroke education    3 Hour Rule Minutes: 60 minutes of SLP treatment this session count towards  intensity and duration of therapy  requirement. Patient was seen for the full  scheduled time of SLP treatment this session.  Therapy Mode Minutes: Individual: 60 minutes.    Signed by: Barbara Cower, PhD, CCC/SLP 10/11/2019 12:00:00 PM

## 2019-10-11 NOTE — Nursing Progress Note (Signed)
Patient voided 175 ml, post void bladder scan volume 425 ml,patient refused intermittent cath.

## 2019-10-11 NOTE — Rehab Evaluation ([facility]) (Signed)
NAMEGILBERT White  MRN: [id number]  Account: [id number]  Session Start: 10/11/2019 12:00:00 AM  Session Stop: 10/11/2019 12:00:00 AM    Total Treatment Minutes:  Minutes    Case Management  Inpatient Rehabilitation Initial Assessment    Rehab Diagnosis: revealed stable R thalamus hemorrhagic infarct.  Demographics:            Age: 64Y            Gender: Male    Past Medical History: Past Medical History:  ? 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  ? Borderline hypertension    on meds in past; BP normal without meds recently  Past Surgical History:  ? CARDIAC ABLATION   04/15/2013    ablation of slow pathway for AVNRT  History of Present Illness: 64 y.o. male with past medical history significant  of hypertension who has not seen any physician for many years what brought into  emergency room by paramedics when he was found confused by his coworker.  CT of  the head done in emergency room showed acute hemorrhagic infarction in the right  thalamus.  At present patient is awake alert oriented to self however has been  intermittently confused.  He was admitted to the ICU, placed on Cardene drip for hypertensive urgency.  Neurosurgery and neurology were consulted. Etiology of ICH is suspected to be  due to hypertension. Neurosurgery recommends no indication for neurosurgical  intervention.  Weaned off Cardene drip.  Mental status has improved. Patient  reports generalized pain otherwise no specific complaints. Patient will be  started on Keppra for 7 days Blood pressure control goal below 140 systolic  patient was a started on hydralazine and amlodipine with good blood pressure  response.  patient is A/T/O X3 and following commands, his last BM was 5/4, he is passing  gas. Patient is tolerating a mechanical soft diet, he is working with PT, OT and  SLP, he had an external foley. Patient would benefit from inpatient AR before  going  home with his family. Covid as negative 5/3.   Date of Onset: 10/04/19   Date of Admission: 10/08/2019 4:57:00 PM    Premorbid Functional Level: Patient's family reported Pt's son reported that  prior to admission pt was Independent, driving, and employed P/T  Understanding of Current Condition: Pt is aware he had a stroke.  Pt feels like  it has been a massive impact on him.  Pt with very slow processing and very  fatigued this session.  His awareness of his functional status is thus not fully  clear.  Patient/Caregiver Goals:  Patient's functional goals: " I want to get better and  walk safely again."  Home Environment: Patient lives in a home environment. Patient lives with  Chestine Spore (pt's fiancee), fiancee's adult daught and spouse and their 6  children who is (are) unable to assist patient at discharge.  Primary Language: English    Demographics: (Source of HX, Pre-Hosp, Marital, Income, Vocation, [place])  Source of History: Other Family.  Pre-Hospital Living  Environment: Home.  Marital Status: Separated.  Income Source:   Associate Professor.    Vocational Status: Employed.  Occupation: Scientist, forensic Part time.  Family Contact Information:  Primary Contact: Maurice March  Relationship: Son  Address:  Primary Number: [phone] (c)  Secondary Number:    Additional Contact: Jiles Garter  Relationship: Daughter  Address:  Primary Number: [phone] (  c)  Secondary Number:  Family or Representative Notification: Patient wants family/chosen  representative to be notified of admission to the hospital. Case Manager  contacted individual named above by phone  POA/Guardian Information:  Patient does not have a guardian.  Care Act Designee: Maurice March  Address:  Phone number: [phone] (c)  Military Status: The patient has never been in the [employer].  Financial Concerns: None    Special Needs: None.   CURRENTLY EXPERIENCING FEVER AND/ OR SYMPTONS OF ACUTE RESPIRATORY ILLNESSES?  NO    TRAVEL HISTORY FROM AFFECTED GEOGRAPHIC  AREAS WITHIN 14 DAYS OF SYMPTOM ONSET?  NO    CLOSE CONTACT WITH LAB CONFIRMED COVID-19 PERSON? NO    RESIDE IN A NURSING HOME OR LONG-TERM CARE FACILITY OR ASSISTED LIVING FACILITY?   NO    WAS THE PATIENT EVER TESTED FOR COVID-19?  YES    TEST RESULTS/DATE? 10/04/2019; - NEGATIVE  Observed Behaviors:  Cooperative.  Psychosocial History:   None.  Adjustment to Present Illness:  Patient is coping adequately.   Patient is accepting limitations adequately.   Patient's expectations are realistic.   Patient is motivated.  Patient Perceived Primary Stressors:  None    Providers:    [facility]  [address]  [address]  [place] [address]  [phone]    [facility]  [phone] [address]  [place] [address]  [phone]    Primary Care Physician Notificaton: Patient does not have a primary care  physician. Patient will be referred to either a [facility] primary  care physician or community clinic at discharge.    Home Care/Long Term Care Policy: None.    Interdisciplinary Educational Needs and Learning Preferences:  Education not  assessed/provided this session.    Rehab Potential: Able to participate in an intensive inpatient interdisciplinary  rehabilitation program, Good premorbid functional status  Barriers to Progress/Discharge: Limited family/caregiver support, Functional  status, Reduced insight    Case Management Psychosocial Assessment:    CM contacted Clide Cliff (pt's son) to introduce self, role, and the AR process. Son  shared that pt resides in a three (3) level house w/three (3) steps to the front  door (no rails). Once inside there are three (3) steps to the bdrms (no rails).  Pt lives w/Sherina (pt's fiancee), Morrie Sheldon (fiancess's daugher), daughters spouse  and their 6 children. Pt's fiancee is disabled and will not be able to provide  assistance to pt at d/c. Morrie Sheldon (will not be able to provide assiatcne  either as  she provides care to her mother. Pt's son said that at this time he does not  know who will provide assistance to pt at d/c.  Prior to admission pt was Independent, driving, and employed P/T. Pt does not  own any DME in the home.  Intial Team Rounds to be held on Tuesday, May 11 to discuss pt's functional  status, ELOS, barriers to d/c, and d/c recommendations.  CM will continue to follow.    Family Meeting: Family meeting was not offered at this time.  Community Resources: None    Medicare Important Message: The Medicare Important Message letter was issued.    Care Plan  Identified problems from team documentation:  Problem: Impaired Cardiac Function  Cardiac: Primary Team Goal: Patient will display vital signs within  limits./Active  Cardiac: Additional Team Goal: Patient and caregiver will teack-back  BEFAST./Active  Problem: Impaired Cognition  Cognition: Primary Team Goal: Patient will demonstrate adequate attention and  visual / verbal processing to complete ADLs and mild / moderately complex  cognitive tasks for periods of 20 minutes provided min redirections./Active    Problem: Impaired Mobility  Mobility: Primary Team Goal: Pt will perform majority of mobility at a  supervision level with most appropriate assistive device for increased safety  and indepednence upon discharge./Active    Problem: Impaired Self-care Mgmt/ADL/IADL  Self Care: Primary Team Goal: Pt will complete self carea at a superivsio/CGA  level provided by family.care giver in order to return home safely./Active    Problem: Impaired Swallowing  Swallowing: Primary Team Goal: Patient will incorporate swallow strategies to  maintain adequate mastication and functional oral clearance in order to safely  tolerate regular texture/thin liquid diet in 100% opportunities mod I./Active    Problem: Safety Risk and Restraint  Safety: Primary Team Goal: Patient will display learned safety  precautions./Active    Identified problems from  this assessment:     No problems identified at this time.    Please review Integrated Patient View Care Plan Flowsheet for Team identified  Problems, Interventions, and Goals.    Signed by: Paulla Fore, MSW 10/11/2019 3:27:00 PM

## 2019-10-11 NOTE — Rehab Progress Note ([facility]) (Signed)
NAMETREVIS EDEN  MRN: [id number]  Account: [id number]  Session Start: 10/11/2019 8:00:00 AM  Session Stop: 10/11/2019 9:00:00 AM    Total Treatment Minutes: 60.00 Minutes    Occupational Therapy  Inpatient Rehabilitation Treatment Note    Rehab Diagnosis: revealed stable R thalamus hemorrhagic infarct.  Demographics:            Age: 58Y            Gender: Male  Rehabilitation Precautions/Restrictions:   Risk of aspiration/swallowing. Mechanical soft   Risk for falls    Vital Signs:                       Before Activity              After Activity  Vitals  BP Systolic          125                          133  BP Diastolic         78                           81  Pulse                89                           91    Interventions:       Self Care/Home Management:  Appreciated direct handoff with nursing, pt  with no new medical concerns. Session with improving independence  in morning  ADL routine. Patient sat unsupported on EOB for 10 minutes and ate break fast  with set up and cues for midline positioning. Rest of session with focus on  bathing at dressing while seated in the wheelchair. patient performed dressing  and bathing tasks with grossly max a. Patient required verbal cues for attention  and processing during oral care. Patient performed a stand pivot transfer with 1  person max a, heavy multimodal cues and extended time.  Patient was left seated in chair in his/her room. Handoff to nurse completed.  Chair alarm in place and activated.  Oriented to call bell and placed within reach.  Personal items within reach.    ASSESSMENT  Patient benefitted from a highly structured environment for improved attention  during tasks. Patient required max a to implement rest breaks throughout the  session 2/2 to cognitive and physical fatigue. Patient would benefit from  session with focus in mass practice of functional transfers in order to maximize  patient participation and safety during self care tasks. Continue  with OT POC.    3 Hour Rule Minutes: 60 minutes of OT treatment this session count towards  intensity and duration of therapy requirement. Patient was seen for the full  scheduled time of OT treatment this session.  Therapy Mode Minutes: Individual: 60 minutes.    Signed by: Joseph Pierini, OTR/L 10/11/2019 9:00:00 AM

## 2019-10-11 NOTE — Progress Notes (Signed)
Michael White  MRN: [id number]  Account: [id number]  Session Start: 10/11/2019 12:00:00 AM  Session Stop: 10/11/2019 12:00:00 AM    Total Treatment Minutes:  Minutes    Physical Medicine and Rehabilitation  Initial Individualized Interdisciplinary Plan Of Care    Rehab Diagnosis: revealed stable R thalamus hemorrhagic infarct.  Demographics:            Age: 36Y            Gender: Male    Plan Of Care  Anticipated Discharge Date/Estimated Length of Stay: 10-14d  Anticipated Discharge Destination: Community discharge with assistance  Medical Necessity Expected Level Rationale: CVA  Intensity and Duration: an average of 3 hours/5 days per week  Medical Supervision and 24 Hour Rehab Nursing: x  Physical Therapy: x  PT Intensity/Duration: 60-172m/d x 10-14d  Occupational Therapy: x  OT Intensity/Duration: 60-172m/d x 10-14d  Speech and Language Therapy: x  SLP Intensity/Duration: 60-125m/d x 10-14d    The following is a list of patient problems that have been identified by the  interdisciplinary team:    Problem: Impaired Cardiac Function  Cardiac Status Update: Patient needs BP monitoring and stroke education.  Interventions:  Administer cardiac medications as prescribed monitoring for side effects: Active  Monitor vital signs/hemodynamics and report changes: Active  Provide structured rest periods: Active  Patient/family/caregiver education: Active  Cardiac: Primary Team Goal/Status: Patient will display vital signs within  limits. / Active  Cardiac: Additional Team Goal/Status: Patient and caregiver will teack-back  BEFAST. / Active    Problem: Impaired Cognition  Team Identified Barrier to Discharge: Yes  Interventions:  Decrease environmental stimuli: Active  Safety awareness training: Active  Compensatory strategies: Active  Cognition: Primary Team Goal/Status: Patient will demonstrate adequate attention  and visual / verbal processing to complete ADLs and mild / moderately complex  cognitive tasks for periods  of 20 minutes provided min redirections. / Active    Problem: Impaired Mobility  Interventions:  Transfer training: Active  Gait training: Active  Mobility: Primary Team Goal/Status: Pt will perform majority of mobility at a  supervision level with most appropriate assistive device for increased safety  and indepednence upon discharge. / Active    Problem: Impaired Self-care Mgmt/ADL/IADL  Team Identified Barrier to Discharge: Yes  Interventions:  Adaptive equipment training: Active  Compensatory strategies: Active  Encourage patient to participate in activities of daily living: Active  Self Care: Primary Team Goal/Status: Pt will complete self carea at a  superivsio/CGA level provided by family.care giver in order to return home  safely. / Active    Problem: Impaired Swallowing  Team Identified Barrier to Discharge: Yes  Interventions:  Monitor for signs and symptoms of aspiration: Active  Encourage/facilitated utilization of compensatory strategies for safe oral  intake: Active  Swallowing: Primary Team Goal/Status: Patient will incorporate swallow  strategies to maintain adequate mastication and functional oral clearance in  order to safely tolerate regular texture/thin liquid diet in 100% opportunities  mod I. / Active    Problem: Safety Risk and Restraint  Safety Risk Status Update: Patient needs reminders to call for assistance prior  to ambulation.  Interventions:  Identify patient at risk for falls: Active  Call light within reach: Active  Orient patient/family/caregiver to room and equipment: Active  Stay with patient in bathroom: Active  Bed alarm/wheelchair alarm: Active  Anticipate needs, including personal items within reach: Active  Safety: Primary Team Goal/Status: Patient will display learned safety  precautions. / Active  Comments:    Signed by: Consuello Masse, MD 10/11/2019 3:24:00 PM    Physician CoSigned By: Consuello Masse 10/11/2019 15:24:21

## 2019-10-11 NOTE — Rehab Progress Note ([facility]) (Signed)
NAMEABHI MOCCIA  MRN: [id number]  Account: [id number]  Session Start: 10/11/2019 12:00:00 AM  Session Stop: 10/11/2019 12:00:00 AM    Total Treatment Minutes:  Minutes    Rehabilitation Nursing  Inpatient Rehabilitation Shift Assessment    Rehab Diagnosis: revealed stable R thalamus hemorrhagic infarct.  Demographics:            Age: 27Y            Gender: Male  Primary Language: English    Date of Onset:  10/04/19  Date of Admission: 10/08/2019 4:57:00 PM    Rehabilitation Precautions Restrictions:   Risk of aspiration/swallowing. Mechanical soft   Risk for falls    Patient Report: " I'm doing fine."  Patient/Caregiver Goals:  " I want to get better and walk safely again."    Wounds/Incisions: No wounds or incisions.    Medication Review: No clinically significant medication issues identified this  shift.    Bowel and Bladder Output:                       Bladder (# only)             Bowel (# only)  Number of Episodes  Continent            1                            0  Incontinent          0                            0    Additional Education Provided:    Education Provided: Safety issues and interventions. Fall protocol. Supervision  requirements.       Audience: Patient.       Mode: Explanation.       Response: Verbalized understanding.  Needs reinforcement.    TEAM CARE PLAN  Identified problems from team documentation:  Problem: Impaired Cardiac Function  Cardiac: Primary Team Goal: Patient will display vital signs within  limits./Active  Cardiac: Additional Team Goal: Patient and caregiver will teack-back  BEFAST./Active    Problem: Impaired Cognition  Cognition: Primary Team Goal: Patient will demonstrate adequate attention and  visual / verbal processing to complete ADLs and mild / moderately complex  cognitive tasks for periods of 20 minutes provided min redirections./Active    Problem: Impaired Mobility  Mobility: Primary Team Goal: Pt will perform majority of mobility at a  supervision level with most  appropriate assistive device for increased safety  and indepednence upon discharge./Active    Problem: Impaired Self-care Mgmt/ADL/IADL  Self Care: Primary Team Goal: Pt will complete self carea at a superivsio/CGA  level provided by family.care giver in order to return home safely./Active    Problem: Impaired Swallowing  Swallowing: Primary Team Goal: Patient will incorporate swallow strategies to  maintain adequate mastication and functional oral clearance in order to safely  tolerate regular texture/thin liquid diet in 100% opportunities mod I./Active    Problem: Safety Risk and Restraint  Safety: Primary Team Goal: Patient will display learned safety  precautions./Active    Please review Integrated Patient View Care Plan Flowsheet for Team identified  Problems, Interventions, and Goals.    Signed by: Tamala Ser, RN 10/11/2019 2:53:00 AM

## 2019-10-11 NOTE — Progress Notes (Signed)
PHYSICAL MEDICINE AND REHABILITATION  PROGRESS NOTE  FACE TO Bruce Donath    Date Time: 10/11/19 6:03 PM    Patient Name: Michael White, Michael SR.  Admission date:  10/08/2019  (LOS: 3 days)    Subjective:     Patient denies pain. Slept well. Having regular bowel movements. No new issues.     Functional Status:     Interventions:       Therapeutic Activities:  Pt in bed upon PT arrival, RN finishing with  bladder scan and in and out cath.  Pt agreeable to therapy session, pt's son  present throughout to observe.  With increased time pt comes to sitting EOB with  S and completes squat pivot transfer to w/c with Min A; once in w/c he reports  need to urinate (bladder did not empty with cath and bladder scan reports just  over volume).  Pt is given urinal and is able to position it and void a  small amount.  He practices sit to stand with RW with min A, needing increased  time to correct posterior LOB but able to maintain once forward weight shift is  achieved.  He practices gait training approx 15' x 1 and 10' x 1 each with CGA  using RW, with increased time and frequent cues for big steps.  Pt transfers  back to bed at end of session with Min A to pivot and S with increased time to  attain R side lying.    Medications:   Medication reviewed by me:     Scheduled Meds: PRN Meds:    amLODIPine, 5 mg, Oral, Daily  hydrALAZINE, 50 mg, Oral, Q8H  levETIRAcetam, 750 mg, Oral, Q12H SCH  traZODone, 50 mg, Oral, QHS        Continuous Infusions:   acetaminophen, 650 mg, Q6H PRN  cyclobenzaprine, 5 mg, TID PRN  lidocaine, , PRN  ondansetron, 4 mg, Q8H PRN          Medication Review  1. A complete drug regimen review was completed: Yes  2. Were drug issues were found during review: No   If yes to drug issues found during review:   What was the issue?:    What was the time the issue was identified?:    Was I contacted and action was taken by midnight of the next calendar day once issue was identified?: N/A    Person who  contacted me:    Action taken:     Review of Systems:   A comprehensive review of systems was: No fevers, chills, nausea, vomiting, chest pain, shortness of breath, cough, headache, double vision.  All others negative.    Labs:     Recent Labs   Lab 10/11/19  0601 10/09/19  0534 10/08/19  0352 10/07/19  0409   WBC 7.25 6.30 8.01 8.86   Hgb 13.6 13.0 13.3 13.0   Hematocrit 44.0 41.8 43.1 42.3   Platelets 371* 327 333 366*      Recent Labs   Lab 10/11/19  0601 10/08/19  1919 10/08/19  0352 10/07/19  0409 10/06/19  0349 10/06/19  0349 10/05/19  0313 10/05/19  0313   Sodium 136 135* 134* 136  More results in Results Review 134*  More results in Results Review 137   Potassium 4.6 4.0 4.6 3.9  More results in Results Review 4.0  More results in Results Review 3.8   Chloride 100 101 103 104  More results in Results Review  103  More results in Results Review 105   CO2 25 25 18* 21*  More results in Results Review 21*  More results in Results Review 23   BUN 13 14 13.0 10.0  More results in Results Review 13.0  More results in Results Review 9.0   Creatinine 0.9 0.9 0.8 0.9  More results in Results Review 0.9  More results in Results Review 0.9   Calcium 9.2 9.0 8.8 9.1  More results in Results Review 8.8  More results in Results Review 9.3   Albumin  --  3.7  --   --   --  3.6  --  3.9   Protein, Total  --  6.8  --   --   --  6.6  --  7.1   Bilirubin, Total  --  0.8  --   --   --  1.2  --  1.7*   Alkaline Phosphatase  --  82  --   --   --  88  --  95   ALT  --  14  --   --   --  16  --  18   AST (SGOT)  --  11  --   --   --  18  --  24   Glucose 93 119* 86 101*  More results in Results Review 94  More results in Results Review 95   Magnesium  --   --   --   --   --  1.7  --  1.6   Phosphorus  --   --   --   --   --  2.9  --  3.8   More results in Results Review = values in this interval not displayed.     Recent Labs   Lab 10/06/19  0349 10/05/19  0313   PT INR 1.2* 1.2*     Rads:   Radiological Procedure  reviewed.  Radiology Results (24 Hour)     ** No results found for the last 24 hours. **        Physical Exam:     Vitals:    10/11/19 0800 10/11/19 1353 10/11/19 1429 10/11/19 1644   BP: 154/74 116/74 143/84 119/73   Pulse: 85  97 (!) 101   Resp: 20   18   Temp: 97.5 F (36.4 C)   97.7 F (36.5 C)   TempSrc: Oral   Oral   SpO2: 100%   99%   Weight:       Height:           Intake and Output Summary (Last 24 hours) at Date Time    Intake/Output Summary (Last 24 hours) at 10/11/2019 1803  Last data filed at 10/11/2019 1700  Gross per 24 hour   Intake 175 ml   Output 1205 ml   Net -1030 ml     P.O.: 175 mL (10/10/19 2044)     Urine: 505 mL (10/11/19 1700)  Bladder Scan Volume (mL): 485 mL (10/11/19 1030)  Intermittent/Straight Cath (mL): (Patient refused) (10/11/19 0537)       Cardiac: regular rhythm  Chest / Lungs:  Clear to auscultation.  Abdomen:  + bowel sounds, Soft, non-tender, non-distended.  Extremities: no calf tenderness.    Assessment and Plan:     The following medical/functional conditions and comorbidities may be complicating factors in their comprehensive rehabilitation program.    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 No pharmacologics due to intracranial bleed    #Bowel/Bladder Management: PVRs to rule out urinary retention. Use Senna and Colace    #Muscle cramps: Patient states these are chronic for >12 years which he attributes to constantly moving heavy furniture  Flexeril 5 mg TID  Follow up CK levels    #Skin Care: Keep skin clean and dry    #Comorbities (HTN): Continue Norvasc, Hydralazine    #Psych (hx tobacco dependence-Quit Sep 13, 2019): Psychology services for evaluation and treatment as needed for adjustment/coping   Endorses a lot of stressors including financial and family given his wife has  dementia and he is the bread winner.    #FEN/GI (Dysphagia): Mechanical soft diet, SLP to follow    #Dispo: TBD pending rehab progress      Continue comprehensive and intensive inpatient rehab program, including:   Physical therapy 60-120 min daily, 5-6 times per week, Occupational therapy 60-120 min daily, 5-6 times per week, Case management and Rehabilitation nursing.  Psychology services to evaluate and treat as needed     Signed by: Cheryll Dessert MD  10/11/2019, 6:03 PM    [facility]

## 2019-10-11 NOTE — Rehab Evaluation ([facility]) (Signed)
NAMERENALDO GORNICK  MRN: [id number]  Account: [id number]  Session Start: 10/11/2019 12:45:00 PM  Session Stop: 10/11/2019 1:00:00 PM    Total Treatment Minutes:  Minutes    Therapeutic Recreation  Inpatient Rehabilitation Initial Evaluation    Risks/Benefits of Rehabilitation Discussed with Patient/Caregiver: Yes.    Rehab Diagnosis: revealed stable R thalamus hemorrhagic infarct.  Demographics:            Age: 64Y            Gender: Male  Primary Language: English    Past Medical History: Past Medical History:  ? 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  ? Borderline hypertension    on meds in past; BP normal without meds recently  Past Surgical History:  ? CARDIAC ABLATION   04/15/2013    ablation of slow pathway for AVNRT  History of Present Illness: 64 y.o. male with past medical history significant  of hypertension who has not seen any physician for many years what brought into  emergency room by paramedics when he was found confused by his coworker.  CT of  the head done in emergency room showed acute hemorrhagic infarction in the right  thalamus.  At present patient is awake alert oriented to self however has been  intermittently confused.  He was admitted to the ICU, placed on Cardene drip for hypertensive urgency.  Neurosurgery and neurology were consulted. Etiology of ICH is suspected to be  due to hypertension. Neurosurgery recommends no indication for neurosurgical  intervention.  Weaned off Cardene drip.  Mental status has improved. Patient  reports generalized pain otherwise no specific complaints. Patient will be  started on Keppra for 7 days Blood pressure control goal below 140 systolic  patient was a started on hydralazine and amlodipine with good blood pressure  response.  patient is A/T/O X3 and following commands, his last BM was 5/4, he is passing  gas. Patient is tolerating a mechanical soft diet, he is  working with PT, OT and  SLP, he had an external foley. Patient would benefit from inpatient AR before  going home with his family. Covid as negative 5/3.            Date of Onset: 10/04/19            Date of Admission: 10/08/2019 4:57:00 PM    Medications and Allergies: Significant rehabilitation considerations:   Please see EPIC.  Rehabilitation Precautions/Restrictions:   Risk of aspiration/swallowing. Mechanical soft   Risk for falls    SUBJECTIVE  Premorbid Functional Level: Patient reported Pt was independent per his own  report.  Understanding of Current Condition: Pt is aware he had a stroke.  Pt feels like  it has been a massive impact on him.  Pt with very slow processing and very  fatigued this session.  His awareness of his functional status is thus not fully  clear.  Patient/Caregiver Goals:  Patient's functional goals: " I want to get better and  walk safely again."  Pain: Patient currently without complaints of pain.  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 Holsworth reports he primarily enjoys watching sport, and use to play  basketball.      Interdisciplinary Educational Needs and Learning Preferences:  Education not  assessed/provided this session.    ASSESSMENT  Summary of Deficits and Related Problems: Patient presents with the following  deficits: Decreased Activity Level. Decreased Community Functioning. Decreased  Functional Mobility.  These deficits are secondary to: Impaired Strength. Impaired Balance. Sensory  Loss.  Rehab Potential: Able to participate in an intensive inpatient interdisciplinary  rehabilitation program, Good premorbid functional status, Living in the  community premorbidly  Barriers  to Progress/Discharge: Poor activity tolerance, Functional status,  Medical condition    Long Term Goals: Time frame to achieve long term goal(s): 3 weeks       1. Mr. Fluegge will participate in familiar leisure task with min assist of  caregiver.       2. Mr. Afzal will attend to left, during leisure task with min assist/cues  from caregiver.  Short Term Goals: Not applicable.    Recommendations/Goals for Rehabilitation Discussed with Patient/Caregiver: Yes.    Mr. Cure seen bedside to discuss TR role and goals.  Enjoys sports, and is  motivated to gain strength in his upper body.    PLAN  Therapeutic Recreation services are recommended to address: mobility, endurance    Care Plan  Identified problems from team documentation:  Problem: Impaired Cardiac Function  Cardiac: Primary Team Goal: Patient will display vital signs within  limits./Active  Cardiac: Additional Team Goal: Patient and caregiver will teack-back  BEFAST./Active    Problem: Impaired Cognition  Cognition: Primary Team Goal: Patient will demonstrate adequate attention and  visual / verbal processing to complete ADLs and mild / moderately complex  cognitive tasks for periods of 20 minutes provided min redirections./Active    Problem: Impaired Mobility  Mobility: Primary Team Goal: Pt will perform majority of mobility at a  supervision level with most appropriate assistive device for increased safety  and indepednence upon discharge./Active    Problem: Impaired Self-care Mgmt/ADL/IADL  Self Care: Primary Team Goal: Pt will complete self carea at a superivsio/CGA  level provided by family.care giver in order to return home safely./Active    Problem: Impaired Swallowing  Swallowing: Primary Team Goal: Patient will incorporate swallow strategies to  maintain adequate mastication and functional oral clearance in order to safely  tolerate regular texture/thin liquid diet in 100% opportunities mod I./Active    Problem: Safety Risk and Restraint  Safety:  Primary Team Goal: Patient will display learned safety  precautions./Active    Identified problems from this assessment:     No problems identified at this time.    Please review Integrated Patient View Care Plan Flowsheet for Team identified  Problems, Interventions, and Goals.    Signed by: Idolina Primer, CTRS 10/11/2019 1:00:00 PM

## 2019-10-12 MED ORDER — TAMSULOSIN HCL 0.4 MG PO CAPS
0.40 mg | ORAL_CAPSULE | Freq: Every day | ORAL | Status: DC
Start: 2019-10-12 — End: 2019-10-21
  Administered 2019-10-12 – 2019-10-20 (×9): 0.4 mg via ORAL
  Filled 2019-10-12 (×9): qty 1

## 2019-10-12 NOTE — Rehab Progress Note ([facility]) (Signed)
Michael White  MRN: [id number]  Account: [id number]  Session Start: 10/12/2019 12:00:00 AM  Session Stop: 10/12/2019 12:00:00 AM    Total Treatment Minutes:  Minutes    Rehabilitation Nursing  Inpatient Rehabilitation Shift Assessment    Rehab Diagnosis: revealed stable R thalamus hemorrhagic infarct.  Demographics:            Age: 44Y            Gender: Male  Primary Language: English    Date of Onset:  10/04/19  Date of Admission: 10/08/2019 4:57:00 PM    Rehabilitation Precautions Restrictions:   Risk of aspiration/swallowing. Mechanical soft  Risk for falls    Patient Report: " I have pain when I try to urinate"  Patient/Caregiver Goals:  " I want to get better and walk safely again."    Wounds/Incisions: No wounds or incisions.    Medication Review: No clinically significant medication issues identified this  shift.    Bowel and Bladder Output:                       Bladder (# only)             Bowel (# only)  Number of Episodes  Continent            3                            0  Incontinent          0                            0    Additional Education Provided:    Education Provided: Medication. Name and dosage. Administration. Purpose.       Audience: Patient.       Mode: Explanation.       Response: Verbalized understanding.    TEAM CARE PLAN  Identified problems from team documentation:  Problem: Impaired Cardiac Function  Cardiac: Primary Team Goal: Patient will display vital signs within  limits./Active  Cardiac: Additional Team Goal: Patient and caregiver will teack-back  BEFAST./Active    Problem: Impaired Cognition  Cognition: Primary Team Goal: Patient will demonstrate adequate attention and  visual / verbal processing to complete ADLs and mild / moderately complex  cognitive tasks for periods of 20 minutes provided min redirections./Active    Problem: Impaired Mobility  Mobility: Primary Team Goal: Pt will perform majority of mobility at a  supervision level with most appropriate assistive device  for increased safety  and indepednence upon discharge./Active    Problem: Impaired Self-care Mgmt/ADL/IADL  Self Care: Primary Team Goal: Pt will complete self carea at a superivsio/CGA  level provided by family.care giver in order to return home safely./Active    Problem: Impaired Swallowing  Swallowing: Primary Team Goal: Patient will incorporate swallow strategies to  maintain adequate mastication and functional oral clearance in order to safely  tolerate regular texture/thin liquid diet in 100% opportunities mod I./Active    Problem: Safety Risk and Restraint  Safety: Primary Team Goal: Patient will display learned safety  precautions./Active    Please review Integrated Patient View Care Plan Flowsheet for Team identified  Problems, Interventions, and Goals.    Signed by: Burley Saver, RN 10/12/2019 2:34:00 PM

## 2019-10-12 NOTE — Progress Notes (Signed)
PHYSICAL MEDICINE AND REHABILITATION  PROGRESS NOTE  FACE TO Bruce Donath    Date Time: 10/12/19 5:44 PM    Patient Name: Michael White, Michael SR.  Admission date:  10/08/2019  (LOS: 4 days)    Subjective:     Patient has no new complaints. Denies pain. Tolerating therapies.   Add flomax for urinary retention   Seen with SLP    Functional Status:     Interventions:       Therapeutic Activities:  Pt asleep in bed upon PT arrival and not yet  dressed for the day.  He dons pants with Min A to get the waistband past his  feet, but completes the remainder of LE threading with S and can pull his pants  up with PT providing Min A to maintain standing balance.  With increased time he  transfers into w/c with Min A.  Pt completes gait training 15' x 1, 20' x 1, and  10' x 1, all with CGA using RW and significantly increased time.  Patient was left seated in chair at nurse's station. Handoff to nurse completed.  Chair alarm in place and activated.  Personal items within reach.    Medications:   Medication reviewed by me:     Scheduled Meds: PRN Meds:    amLODIPine, 5 mg, Oral, Daily  hydrALAZINE, 50 mg, Oral, Q8H  levETIRAcetam, 750 mg, Oral, Q12H SCH  tamsulosin, 0.4 mg, Oral, Daily after dinner  traZODone, 50 mg, Oral, QHS        Continuous Infusions:   acetaminophen, 650 mg, Q6H PRN  cyclobenzaprine, 5 mg, TID PRN  lidocaine, , PRN  ondansetron, 4 mg, Q8H PRN          Medication Review  1. A complete drug regimen review was completed: Yes  2. Were drug issues were found during review: No   If yes to drug issues found during review:   What was the issue?:    What was the time the issue was identified?:    Was I contacted and action was taken by midnight of the next calendar day once issue was identified?: N/A    Person who contacted me:    Action taken:     Review of Systems:   A comprehensive review of systems was: No fevers, chills, nausea, vomiting, chest pain, shortness of breath, cough, headache, double vision.  All  others negative.    Labs:     Recent Labs   Lab 10/11/19  0601 10/09/19  0534 10/08/19  0352 10/07/19  0409   WBC 7.25 6.30 8.01 8.86   Hgb 13.6 13.0 13.3 13.0   Hematocrit 44.0 41.8 43.1 42.3   Platelets 371* 327 333 366*      Recent Labs   Lab 10/11/19  0601 10/08/19  1919 10/08/19  0352 10/07/19  0409 10/06/19  0349 10/06/19  0349   Sodium 136 135* 134* 136  More results in Results Review 134*   Potassium 4.6 4.0 4.6 3.9  More results in Results Review 4.0   Chloride 100 101 103 104  More results in Results Review 103   CO2 25 25 18* 21*  More results in Results Review 21*   BUN 13 14 13.0 10.0  More results in Results Review 13.0   Creatinine 0.9 0.9 0.8 0.9  More results in Results Review 0.9   Calcium 9.2 9.0 8.8 9.1  More results in Results Review 8.8   Albumin  --  3.7  --   --   --  3.6   Protein, Total  --  6.8  --   --   --  6.6   Bilirubin, Total  --  0.8  --   --   --  1.2   Alkaline Phosphatase  --  82  --   --   --  88   ALT  --  14  --   --   --  16   AST (SGOT)  --  11  --   --   --  18   Glucose 93 119* 86 101*  More results in Results Review 94   Magnesium  --   --   --   --   --  1.7   Phosphorus  --   --   --   --   --  2.9   More results in Results Review = values in this interval not displayed.     Recent Labs   Lab 10/06/19  0349   PT INR 1.2*     Rads:   Radiological Procedure reviewed.  Radiology Results (24 Hour)     ** No results found for the last 24 hours. **        Physical Exam:     Vitals:    10/12/19 0950 10/12/19 1109 10/12/19 1359 10/12/19 1701   BP: 120/82 118/68 144/83 120/75   Pulse: 98 (!) 111  86   Resp:    17   Temp:    98.2 F (36.8 C)   TempSrc:    Oral   SpO2:    98%   Weight:       Height:           Intake and Output Summary (Last 24 hours) at Date Time    Intake/Output Summary (Last 24 hours) at 10/12/2019 1744  Last data filed at 10/12/2019 1514  Gross per 24 hour   Intake 680 ml   Output 1220 ml   Net -540 ml     P.O.: 240 mL (10/12/19 1320)     Urine: 200 mL (10/12/19  1514)  Bladder Scan Volume (mL): 309 mL (10/12/19 1252)  Intermittent/Straight Cath (mL): (Patient refused) (10/11/19 0537)       Cardiac: regular rhythm  Chest / Lungs:  ctax2  Abdomen:  +BS  Extremities: no calf tenderness.    Assessment and Plan:     The following medical/functional conditions and comorbidities may be complicating factors in their comprehensive rehabilitation program.    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 No pharmacologics due to intracranial bleed    #Bowel/Bladder Management: PVRs to rule out urinary retention. Use Senna and Colace    #Muscle cramps: Patient states these are chronic for >12 years which he attributes to constantly moving heavy furniture  Flexeril 5 mg TID  Follow up CK levels    #Skin Care: Keep skin clean and dry    #Comorbities (HTN): Continue Norvasc, Hydralazine    #Psych (hx tobacco dependence-Quit Sep 13, 2019): Psychology services for evaluation and treatment as needed for adjustment/coping   Endorses a lot of stressors including financial and family given his wife has dementia and he is the bread winner.    #FEN/GI (Dysphagia): Mechanical soft diet, SLP to follow    #Dispo: TBD pending rehab progress  Continue comprehensive and intensive inpatient rehab program, including:   Physical therapy 60-120 min daily, 5-6 times per week, Occupational therapy 60-120 min daily, 5-6 times per week, Case management and Rehabilitation nursing.  Psychology services to evaluate and treat as needed     Signed by: Cheryll Dessert MD  10/12/2019, 5:44 PM    [facility]

## 2019-10-12 NOTE — Rehab IRF Data Coll Rights Priv Act ([facility]) (Signed)
NAMEKAHRON KAUTH  MRN: [id number]  Account: [id number]  Session Start: 10/08/2019 12:00:00 AM  Session Stop: 10/08/2019 12:00:00 AM    Total Treatment Minutes:  Minutes      IRFPPS Data Collection Rights and Rehab Privacy Act Notice: IRF-PAI Data  Collection Rights and Rehab Privacy Act Statement provided/given to patient and  family.    Signed by: Tanda Rockers, PT, PPS Coordiantor 10/08/2019 5:30:00 PM

## 2019-10-12 NOTE — Rehab PPS CMG ([facility]) (Signed)
[name]  MRN: [id number]  Account: [id number]  Session Start: 10/10/2019 12:00:00 AM  Session Stop: 10/10/2019 12:00:00 AM    Total Treatment Minutes:  Minutes    PPS CMG Coordinator  Inpatient Rehabilitation Admission    IRF Admission Date:  10/08/2019      Admission Class: Initial Rehab.  [facility]  Pre-Hospital Living: Home. Pre-Hospital Living  With: (2) Family/Relatives.  Payer Source: Primary: Medicare Fee for Service  Secondary: Not Listed.  Additional Information: None.  Ethnic Group:  Leisure centre manager.  Marital Status:  Marital Status: Separated.    Impairment Group: 01.4 No Paresis  Date of Onset of Impairment: 10/04/2019    Etiologic Diagnosis Code(s):   Rank Code      Description  1    I61.0     Nontraumatic intracerebral hemorrhage in                 hemisphere, subcortical    Comorbidities:   Rank Code      Description  1    I10.      Essential (primary) hypertension    Are there any arthritis conditions recorded for Impairment Group, Etiologic  Diagnosis, or Comorbid Conditions that meet all of the regulatory requirements  for IRF classification (in 42 CFR 412.29(b)(2)(x), (xi), and xii))? No    Cognitive Patterns: Brief Interview for Mental Status (BIMS) was conducted.  Repetition of Three Words: Three words  Able to report correct year: Correct  Able to report correct month: Accurate within 5 days  Able to report correct day of the week: Incorrect or no answer  Able to recall "sock": Yes, no cue required  Able to recall "blue": Yes, no cue required  Able to recall "bed": Yes, after cuing    BIMS SUMMARY SCORE: 13 Cognitively intact Patient was able to complete the Brief  Interview for Mental Status    Self Care Admission Performance                       Performance  GG0130. Self Care  Eating               2 - Substantial/maximal assistance  Oral hygiene         47 - Not attempted due to medical or safety concerns  Toileting hygiene    2 -  Substantial/maximal assistance  Shower/bathe self    3 - Partial/moderate assistance  Upper body dressing  10 - Not attempted due to environ. limits  Lower body dressing  2 - Substantial/maximal assistance  Footwear on/off      1 - Dependent  Mobility Admission Performance                       Performance  GG0170. Mobility  Roll left/right      2 - Substantial/maximal assistance  Sit to lying         2 - Substantial/maximal assistance  Lying to sitting bed 2 - Substantial/maximal assistance  Sit to stand         2 - Substantial/maximal assistance  Chair/bed transfer   1 - Dependent  Toilet transfer      2 - Substantial/maximal assistance  Car transfer         86 - Not attempted due to medical or safety concerns  Walk 10 feet         88 - Not attempted due to medical or  safety concerns  Walk 50 ft 2 turns   88 - Not attempted due to medical or safety concerns  Walk 150 feet        88 - Not attempted due to medical or safety concerns  10 ft uneven surface 88 - Not attempted due to medical or safety concerns  1 step (curb)        88 - Not attempted due to medical or safety concerns  4 steps              88 - Not attempted due to medical or safety concerns  12 steps             88 - Not attempted due to medical or safety concerns  Picking up object    53 - Not attempted due to medical or safety concerns  Use wheelchair?      No  Care Tool Bowel and Bladder: Bladder Continence: Incontinent less than daily  (e.g., once or twice during the 3-day assessment period).  Bowel Continence: Always continent (no documented incontinence).    MEDICAL NEEDS  Height on Admission: 71 inches.  Weight on Admission: 144 pounds.  Swallowing/Nutritional Status: Modiified food consistency/supervision (patient  requires modified food or liquid consistency and/or needs supervision during  eating for safety).    QUALITY INDICATORS  Skin Conditions: Unhealed Pressure Ulcer/Injuries at Stage 1 or Higher on  Admission:  No.    Active Diagnosis:  Comorbidities and Co-existing Conditions:   Patient does not  have PAD, PVD, or Diabetes Mellitus    Medication:  Potential Clinically Significant Medication Issues: No issues found during  review    Signed by: Tanda Rockers, PT, PPS Coordiantor 10/10/2019 4:00:00 PM

## 2019-10-12 NOTE — Rehab Progress Note ([facility]) (Signed)
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  Demographics:            Age: 66Y            Gender: Male  Primary Language: English    Date of Onset:  10/04/19  Date of Admission: 10/08/2019 4:57:00 PM    Rehabilitation Precautions Restrictions:   Risk of aspiration/swallowing. Mechanical soft  Risk for falls    Patient Report: "It's very painful to do cath"  Patient/Caregiver Goals:  " I want to get better and walk safely again."    Wounds/Incisions: No wounds or incisions.    Medication Review: No clinically significant medication issues identified this  shift.    Bowel and Bladder Output:                       Bladder (# only)             Bowel (# only)  Number of Episodes  Continent            2                            0  Incontinent          0                            0    Additional Education Provided:    Education Provided: Precautions. Pain management. Pain scale. Medication  options. Clinical indicators of pain. Plan of care. Safety issues and  interventions. Fall protocol. Medication. Name and dosage. Administration.  Purpose. Side Effects. Interaction. Stroke. What is stroke, Types of stroke,  Signs /T/ Symptoms associated with stroke, Etiology of stroke,  Anatomy/Physiology of the brain, Stroke prevention, Medication compliance,  Control blood pressure, high cholesterol and/or diabetes, Healthy diet, Increase  physical activity, Reduce stress       Audience: Patient.       Mode: Explanation.  Teacher, English as a foreign language provided.  Stroke ed booklet.       Response: Verbalized understanding.  Needs reinforcement.    TEAM CARE PLAN  Identified problems from team documentation:  Problem: Impaired Cardiac Function  Cardiac: Primary Team Goal: Patient will display  vital signs within  limits./Active  Cardiac: Additional Team Goal: Patient and caregiver will teack-back  BEFAST./Active    Problem: Impaired Cognition  Cognition: Primary Team Goal: Patient will demonstrate adequate attention and  visual / verbal processing to complete ADLs and mild / moderately complex  cognitive tasks for periods of 20 minutes provided min redirections./Active    Problem: Impaired Mobility  Mobility: Primary Team Goal: Pt will perform majority of mobility at a  supervision level with most appropriate assistive device for increased safety  and indepednence upon discharge./Active    Problem: Impaired Self-care Mgmt/ADL/IADL  Self Care: Primary Team Goal: Pt will complete self carea at a superivsio/CGA  level provided by family.care giver in order to return home safely./Active    Problem: Impaired Swallowing  Swallowing: Primary Team Goal: Patient will incorporate swallow strategies to  maintain adequate mastication and functional oral clearance in order to safely  tolerate regular texture/thin  liquid diet in 100% opportunities mod I./Active    Problem: Safety Risk and Restraint  Safety: Primary Team Goal: Patient will display learned safety  precautions./Active    Please review Integrated Patient View Care Plan Flowsheet for Team identified  Problems, Interventions, and Goals.    Signed by: Jacqualyn Posey, RN 10/12/2019 1:38:00 AM

## 2019-10-12 NOTE — Rehab Progress Note ([facility]) (Signed)
Michael White  MRN: [id number]  Account: [id number]  Session Start: 10/12/2019 12:00:00 AM  Session Stop: 10/12/2019 12:00:00 AM    Total Treatment Minutes:  Minutes    Rehabilitation Nursing  Inpatient Rehabilitation Shift Assessment    Rehab Diagnosis: revealed stable R thalamus hemorrhagic infarct.  Demographics:            Age: 73Y            Gender: Male  Primary Language: English    Date of Onset:  10/04/19  Date of Admission: 10/08/2019 4:57:00 PM    Rehabilitation Precautions Restrictions:   Risk of aspiration/swallowing. Mechanical soft  Risk for falls    Patient Report: " I am doing okay".  Patient/Caregiver Goals:  " I want to get better and walk safely again."    Wounds/Incisions: No wounds or incisions.    Medication Review: No clinically significant medication issues identified this  shift.    Bowel and Bladder Output:                       Bladder (# only)             Bowel (# only)  Number of Episodes  Continent            3                            0  Incontinent          0                            0    Additional Education Provided:    Education Provided: Pain management. Pain scale. Medication options. Side  effects. Clinical indicators of pain. Plan of care. Safety issues and  interventions. Fall protocol. Supervision requirements. Skin/wound care.  Critical pressure areas. Prevention of skin breakdown. Activities of daily  living. Bed mobility. Functional transfers. Fall prevention/balance training.  Financial planner. Wheelchair mobility. Safety. Stroke. What is stroke,  Types of stroke, Signs /T/ Symptoms associated with stroke, Stroke prevention,  Medication compliance, Control blood pressure, high cholesterol and/or diabetes,  Smoking cessation, Healthy diet, Increase physical activity, Reduce stress,  Limit alcohol intake, Stroke recovery, Emotional adjustments/depression,  Thrombophlebitis (DVT), Shoulder care/pain prevention, Pressure ulcer, Pulmonary  complications, Prevention of  contractures/subluxation, Tone/ROM/spasticity,  Caregiver stress, Supervision/assistance requirements Cardiac. Problem solving  (signs and symptoms). Nutrition and lifestyle changes. Monitoring heart rate.  Monitoring blood pressure. Medication. Name and dosage. Administration. Purpose.  Side Effects. Interaction. Labs.       Audience: Patient.       Mode: Explanation.  Demonstration.  Teacher, English as a foreign language provided.  Stroke ed booklet.       Response: Applied knowledge.  Verbalized understanding.  Needs reinforcement.    TEAM CARE PLAN  Identified problems from team documentation:  Problem: Impaired Cardiac Function  Cardiac: Primary Team Goal: Patient will display vital signs within  limits./Active  Cardiac: Additional Team Goal: Patient and caregiver will teack-back  BEFAST./Active    Problem: Impaired Cognition  Cognition: Primary Team Goal: Patient will demonstrate adequate attention and  visual / verbal processing to complete ADLs and mild / moderately complex  cognitive tasks for periods of 20 minutes provided min redirections./Active    Problem: Impaired Mobility  Mobility: Primary Team Goal: Pt will perform majority of mobility at a  supervision level with most appropriate  assistive device for increased safety  and indepednence upon discharge./Active    Problem: Impaired Self-care Mgmt/ADL/IADL  Self Care: Primary Team Goal: Pt will complete self carea at a superivsio/CGA  level provided by family.care giver in order to return home safely./Active    Problem: Impaired Swallowing  Swallowing: Primary Team Goal: Patient will incorporate swallow strategies to  maintain adequate mastication and functional oral clearance in order to safely  tolerate regular texture/thin liquid diet in 100% opportunities mod I./Active    Problem: Safety Risk and Restraint  Safety: Primary Team Goal: Patient will display learned safety  precautions./Active    Please review Integrated Patient View Care Plan Flowsheet for Team  identified  Problems, Interventions, and Goals.    Signed by: Michael Peeling, RN 10/12/2019 10:14:00 PM

## 2019-10-12 NOTE — Rehab Progress Note ([facility]) (Signed)
NAMEHY SWIATEK  MRN: [id number]  Account: [id number]  Session Start: 10/12/2019 9:00:00 AM  Session Stop: 10/12/2019 10:00:00 AM    Total Treatment Minutes: 60.00 Minutes    Physical Therapy  Inpatient Rehabilitation Treatment Note    Rehab Diagnosis: revealed stable R thalamus hemorrhagic infarct.  Demographics:            Age: 1Y            Gender: Male  Rehabilitation Precautions/Restrictions:   Risk of aspiration/swallowing. Mechanical soft  Risk for falls    OBJECTIVE  Vital Signs:                       Before Activity              After Activity  Vitals  BP Systolic          127                          120  BP Diastolic         82                           82  Pulse                -                            98    Interventions:       Therapeutic Activities:  Pt asleep in bed upon PT arrival and not yet  dressed for the day.  He dons pants with Min A to get the waistband past his  feet, but completes the remainder of LE threading with S and can pull his pants  up with PT providing Min A to maintain standing balance.  With increased time he  transfers into w/c with Min A.  Pt completes gait training 15' x 1, 20' x 1, and  10' x 1, all with CGA using RW and significantly increased time.  Patient was left seated in chair at nurse's station. Handoff to nurse completed.  Chair alarm in place and activated.  Personal items within reach.    ASSESSMENT  Pt is extremely limited by impaired initiation and bradykinesia, needing 10 min  to ambulate the first 15 ft.    3 Hour Rule Minutes: 60 minutes of PT treatment this session count towards  intensity and duration of therapy requirement. Patient was seen for the full  scheduled time of PT treatment this session.  Therapy Mode Minutes: Individual: 60 minutes.    Signed by: Jaquita Rector, PT 10/12/2019 10:00:00 AM

## 2019-10-12 NOTE — Rehab Progress Note ([facility]) (Signed)
NAMEMEIR ELWOOD  MRN: [id number]  Account: [id number]  Session Start: 10/12/2019 11:00:00 AM  Session Stop: 10/12/2019 12:00:00 PM    Total Treatment Minutes: 60.00 Minutes    Occupational Therapy  Inpatient Rehabilitation Treatment Note    Rehab Diagnosis: revealed stable R thalamus hemorrhagic infarct.  Demographics:            Age: 40Y            Gender: Male  Rehabilitation Precautions/Restrictions:   Risk of aspiration/swallowing. Mechanical soft  Risk for falls    Vital Signs:                       Before Activity              After Activity  Vitals  BP Systolic          118                          -  BP Diastolic         68                           -  Pulse                111                          -    Interventions:       Therapeutic Activities:  Patient participates in OT session with focus on  initiation and processing, L attention to facilitate engaged in self care.  Activities today including ball catching/bouncing, and cross midline to  retrieve/put away items with patient requiring min to mod  cueing to remain on  task. Session ended, with patient required hand over hand to initiate transfer  to toilet despite requesting time on commode. Min A for pulling down pants, but  due to time constraints this writer was unable to finish toileting task and  patient was handed off to [employer].   toilet Handoff to nurse completed.    ASSESSMENT  Will benefit from continued training for initiation/processing, and L attention.      3 Hour Rule Minutes: 60 minutes of OT treatment this session count towards  intensity and duration of therapy requirement. Patient was seen for the full  scheduled time of OT treatment this session.  Therapy Mode Minutes: Individual: 60 minutes.    Signed by: Elsie Amis, OTR/L 10/12/2019 12:00:00 PM

## 2019-10-12 NOTE — Rehab Progress Note ([facility]) (Signed)
NAMERED MANDT  MRN: [id number]  Account: [id number]  Session Start: 10/12/2019 2:00:00 PM  Session Stop: 10/12/2019 3:00:00 PM    Total Treatment Minutes: 60.00 Minutes    Speech Language Pathology  Inpatient Rehabilitation Treatment Note    Rehab Diagnosis: revealed stable R thalamus hemorrhagic infarct.  Demographics:            Age: 5Y            Gender: Male  Rehabilitation Precautions/Restrictions:   Risk of aspiration/swallowing. Mechanical soft  Risk for falls    Interventions:       Speech Treatment: Greeted patient at bedside to participate in ST  interventions primarily targeting left attention.  Patient independently  identified rationale for hospitalization and current physical and cognitive  limitations impacting his safety and independence at time of d/c.  Introduced  Careers information officer evidenced by patient's right gaze preference in bed and limited  tracking of visual gaze to locate speakers and items on left side.  Provided  initial verbal prompts, patient able to identify items in left visual quadrant  with 100% accuracy.  Requiring hand over hand cues faded to moderate verbal  cues, patient traced edge of bedside table x 3 in order to sufficiently clean  surface and attend to left side of table for 2-3 minute duration.  Family  arrived towards end of session prompting a brief overview regarding rationale  for ST therapy, cognitive performance and strategies to reinforce left sided  attention.  Patient was returned to or left in bed. Handoff to nurse completed.  Bed alarm in place and activated.  Oriented to call bell and placed within reach.  Personal items within reach.    ASSESSMENT  Demonstrates adequate insight regarding limitations post stroke and concerns for  d/c at home.  Continues to benefit from max structure and cues to consistently  engage with communication partners on the left side and locate items in proximal  environment.    3 Hour Rule Minutes: 60 minutes of SLP treatment this  session count towards  intensity and duration of therapy requirement. Patient was seen for the full  scheduled time of SLP treatment this session.  Therapy Mode Minutes: Individual: 60 minutes.    Signed by: Orvan Seen, M.S., CCC-SLP 10/12/2019 3:00:00 PM

## 2019-10-13 MED ORDER — SENNOSIDES-DOCUSATE SODIUM 8.6-50 MG PO TABS
2.0000 | ORAL_TABLET | Freq: Every day | ORAL | Status: DC
Start: 2019-10-13 — End: 2019-10-21
  Administered 2019-10-13 – 2019-10-21 (×8): 2 via ORAL
  Filled 2019-10-13 (×9): qty 2

## 2019-10-13 MED ORDER — BISACODYL 5 MG PO TBEC
10.00 mg | DELAYED_RELEASE_TABLET | Freq: Every day | ORAL | Status: DC | PRN
Start: 2019-10-13 — End: 2019-10-21
  Administered 2019-10-14: 21:00:00 10 mg via ORAL
  Filled 2019-10-13: qty 2

## 2019-10-13 MED ORDER — FLEET ENEMA 7-19 GM/118ML RE ENEM
1.00 | ENEMA | Freq: Once | RECTAL | Status: AC
Start: 2019-10-13 — End: 2019-10-13
  Administered 2019-10-13: 18:00:00 1 via RECTAL
  Filled 2019-10-13: qty 1

## 2019-10-13 MED ORDER — LACTULOSE 10 GM/15ML PO SOLN
20.00 g | ORAL | Status: DC | PRN
Start: 2019-10-13 — End: 2019-10-21
  Administered 2019-10-13 – 2019-10-17 (×4): 20 g via ORAL
  Filled 2019-10-13 (×4): qty 30

## 2019-10-13 NOTE — Progress Notes (Addendum)
Patient complained of constipation and seen  To straining during attempt to have bowel activity. Inability to perform digital stimulation due to pain.Fleet enema was attempted at 1730.  1800. Large output of formed stools noted.

## 2019-10-13 NOTE — Rehab Progress Note ([facility]) (Signed)
Michael White  MRN: [id number]  Account: [id number]  Session Start: 10/13/2019 10:00:00 AM  Session Stop: 10/13/2019 11:00:00 AM    Total Treatment Minutes: 60.00 Minutes    Speech Language Pathology  Inpatient Rehabilitation Treatment Note    Rehab Diagnosis: revealed stable R thalamus hemorrhagic infarct.  Demographics:            Age: 69Y            Gender: Male  Rehabilitation Precautions/Restrictions:   Risk of aspiration/swallowing. Mechanical soft  Risk for falls    Interventions:       Speech Treatment: Session focused on reinforcing learning and acquisition  of edgeness technique taught in previous session for compensating for L  inattention       Speech Treatment: Pt able to use edgeness technique with 75% accuracy with  progressively fading cues from mod verbal-visual to no cues. 100% accuracy noted  with identifying road signs. Pt continues to perseverate on concerns regarding  his family and resuming prior roles. 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 At the end of session, pt demonstrating  use of strategy to locate room reading 1 sign with 100% accuracy and verbal  reminder to use strategy.  Patient was left seated in chair at nurse's station. Handoff to nurse completed.      ASSESSMENT  Processing is slowed and pt requires paced presentation and rest breaks to  complete  visual attention worksheets requiring IDing signs. Pt with internal  distractions and acknowledgement, validation, and motivational interviewing  techniques focused on change talk needed to redirect his attention    3 Hour Rule Minutes: 60 minutes of SLP treatment this session count towards  intensity and duration of therapy requirement. Patient was seen for the full  scheduled time of SLP treatment this session.  Therapy Mode Minutes: Individual: 60 minutes.    Signed by: Barbara Cower, PhD, CCC/SLP 10/13/2019  11:00:00 AM

## 2019-10-13 NOTE — Progress Notes (Signed)
PHYSICAL MEDICINE AND REHABILITATION  PROGRESS NOTE  FACE TO Bruce Donath    Date Time: 10/13/19 5:45 PM    Patient Name: Michael White, Michael SR.  Admission date:  10/08/2019  (LOS: 5 days)    Subjective:     Patient denies pain. Slept well. Having regular bowel movements. No new issues.   Urine retention improving    Functional Status:     Interventions:       Swallowing:  Swallow intervention assessed utilization of safe swallow  precautions with a regular texture meal trial. SLP session during lunch meal -  chicken salad sandwich, saltine crackers, chocolate chip cookie. Patient  self-fed independently via small bites at a regulated pace without cueing. No  instances of oral pocketing or residuals. Hyolaryngeal excursion achieved on  100% trials, no overt s/s aspiration / penetration indicated.       Speech Treatment: SLP and patient discussed processing delay in relation to  meal completion. He demonstrated functional reasoning skills while identifying  ideal time to complete his meal, while factoring in new processing and attention  deficits. Timer used to promote visual and auditory feedback, and increase task  efficiency. SLP provided minimal verbal prompt / repetition in order to increase  motor task initiation in 100% activity, ie calling spouse. Attention and working  memory targeted with L/R differentiation, requiring minimal verbal visual  reminders for L scanning and to check off completed work throughout task.    Medications:   Medication reviewed by me:     Scheduled Meds: PRN Meds:    amLODIPine, 5 mg, Oral, Daily  hydrALAZINE, 50 mg, Oral, Q8H  levETIRAcetam, 750 mg, Oral, Q12H SCH  senna-docusate, 2 tablet, Oral, Daily  tamsulosin, 0.4 mg, Oral, Daily after dinner  traZODone, 50 mg, Oral, QHS        Continuous Infusions:   acetaminophen, 650 mg, Q6H PRN  bisacodyl, 10 mg, Daily PRN  cyclobenzaprine, 5 mg, TID PRN  lactulose, 20 g, Q4H PRN  lidocaine, , PRN  ondansetron, 4 mg, Q8H PRN           Medication Review  1. A complete drug regimen review was completed: Yes  2. Were drug issues were found during review: No   If yes to drug issues found during review:   What was the issue?:    What was the time the issue was identified?:    Was I contacted and action was taken by midnight of the next calendar day once issue was identified?: N/A    Person who contacted me:    Action taken:     Review of Systems:   A comprehensive review of systems was: No fevers, chills, nausea, vomiting, chest pain, shortness of breath, cough, headache, double vision.  All others negative.    Labs:     Recent Labs   Lab 10/11/19  0601 10/09/19  0534 10/08/19  0352 10/07/19  0409   WBC 7.25 6.30 8.01 8.86   Hgb 13.6 13.0 13.3 13.0   Hematocrit 44.0 41.8 43.1 42.3   Platelets 371* 327 333 366*      Recent Labs   Lab 10/11/19  0601 10/08/19  1919 10/08/19  0352 10/07/19  0409   Sodium 136 135* 134* 136   Potassium 4.6 4.0 4.6 3.9   Chloride 100 101 103 104   CO2 25 25 18* 21*   BUN 13 14 13.0 10.0   Creatinine 0.9 0.9 0.8 0.9   Calcium 9.2 9.0 8.8  9.1   Albumin  --  3.7  --   --    Protein, Total  --  6.8  --   --    Bilirubin, Total  --  0.8  --   --    Alkaline Phosphatase  --  82  --   --    ALT  --  14  --   --    AST (SGOT)  --  11  --   --    Glucose 93 119* 86 101*         Rads:   Radiological Procedure reviewed.  Radiology Results (24 Hour)     ** No results found for the last 24 hours. **        Physical Exam:     Vitals:    10/12/19 2004 10/13/19 0513 10/13/19 0829 10/13/19 1531   BP: 123/64 142/76 135/86 140/68   Pulse: 88 89 88    Resp: 16 16 17     Temp: 97.9 F (36.6 C) 97.5 F (36.4 C)     TempSrc: Oral Oral     SpO2: 99% 99% 98%    Weight:       Height:           Intake and Output Summary (Last 24 hours) at Date Time    Intake/Output Summary (Last 24 hours) at 10/13/2019 1745  Last data filed at 10/13/2019 1345  Gross per 24 hour   Intake 1040 ml   Output 1625 ml   Net -585 ml     P.O.: 240 mL (10/13/19 1345)      Urine: 275 mL (10/13/19 1100)  Bladder Scan Volume (mL): 219 mL (10/13/19 0339)  Intermittent/Straight Cath (mL): (Patient refused) (10/11/19 0537)       Cardiac: regular rhythm  Chest / Lungs:  Non labored breathing   Abdomen:  Soft nontender  Extremities: no calf tenderness.    Assessment and Plan:     The following medical/functional conditions and comorbidities may be complicating factors in their comprehensive rehabilitation program.    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 No pharmacologics due to intracranial bleed    #Bowel/Bladder Management: PVRs to rule out urinary retention. Use Senna and Colace    #Muscle cramps: Patient states these are chronic for >12 years which he attributes to constantly moving heavy furniture  Flexeril 5 mg TID  Follow up CK levels    #Skin Care: Keep skin clean and dry    #Comorbities (HTN): Continue Norvasc, Hydralazine    #Psych (hx tobacco dependence-Quit Sep 13, 2019): Psychology services for evaluation and treatment as needed for adjustment/coping   Endorses a lot of stressors including financial and family given his wife has dementia and he is the bread winner.    #FEN/GI (Dysphagia): Mechanical soft diet, SLP to follow    #Dispo: TBD pending rehab progress      Continue comprehensive and intensive inpatient rehab program, including:   Physical therapy 60-120 min daily, 5-6 times per week, Occupational therapy 60-120 min daily, 5-6 times per week, Case management and Rehabilitation nursing.  Psychology services to evaluate and treat as needed     Signed by: Cheryll Dessert MD  10/13/2019, 5:45 PM    [facility]

## 2019-10-13 NOTE — Rehab Progress Note (Medilinks) (Signed)
NAMEDA AUTHEMENT  MRN: [id number]  Account: [id number]  Session Start: 10/13/2019 12:00:00 AM  Session Stop: 10/13/2019 12:00:00 AM    Total Treatment Minutes:  Minutes    Rehabilitation Nursing  Inpatient Rehabilitation Shift Assessment    Rehab Diagnosis: revealed stable R thalamus hemorrhagic infarct.  Demographics:            Age: 74Y            Gender: Male  Primary Language: English    Date of Onset:  10/04/19  Date of Admission: 10/08/2019 4:57:00 PM    Rehabilitation Precautions Restrictions:   Risk for falls    Patient Report: "Thank you for the help"  Patient/Caregiver Goals:  " I want to get better and walk safely again."    Wounds/Incisions: No wounds or incisions.    Medication Review: No clinically significant medication issues identified this  shift.    Bowel and Bladder Output:                       Bladder (# only)             Bowel (# only)  Number of Episodes  Continent            2                            0  Incontinent          2                            0    Additional Education Provided:    Education Provided: Precautions. Pain management. Pain scale. Medication  options. Side effects. Clinical indicators of pain. Bowel and bladder programs.  Bladder training program. Intermittent catheterization.       Audience: Patient.       Mode: Explanation.  Demonstration.       Response: Applied knowledge.  Verbalized understanding.    TEAM CARE PLAN  Identified problems from team documentation:  Problem: Impaired Cardiac Function  Cardiac: Primary Team Goal: Patient will display vital signs within  limits./Active  Cardiac: Additional Team Goal: Patient and caregiver will teack-back  BEFAST./Active    Problem: Impaired Cognition  Cognition: Primary Team Goal: Patient will demonstrate adequate attention and  visual / verbal processing to complete ADLs and mild / moderately complex  cognitive tasks for periods of 20 minutes provided min redirections./Active    Problem: Impaired Mobility  Mobility:  Primary Team Goal: Pt will perform majority of mobility at a  supervision level with most appropriate assistive device for increased safety  and indepednence upon discharge./Active    Problem: Impaired Psychosocial Skills/Behavior  PsychoSocial: Primary Team Goal: Increase adjustment to changes post-stroke and  his next phase of life/    Problem: Impaired Self-care Mgmt/ADL/IADL  Self Care: Primary Team Goal: Pt will complete self carea at a superivsio/CGA  level provided by family.care giver in order to return home safely./Active    Problem: Impaired Swallowing  Swallowing: Primary Team Goal: Patient will incorporate swallow strategies to  maintain adequate mastication and functional oral clearance in order to safely  tolerate regular texture/thin liquid diet in 100% opportunities mod I./Active    Problem: Safety Risk and Restraint  Safety: Primary Team Goal: Patient will display learned safety  precautions./Active    Please review Integrated Patient View Care Plan Flowsheet for  Team identified  Problems, Interventions, and Goals.    Signed by: Irving Shows, RN 10/13/2019 11:28:00 PM

## 2019-10-13 NOTE — Rehab Evaluation (Medilinks) (Signed)
Michael White  MRN: [id number]  Account: [id number]  Session Start: 10/09/2019 8:00:00 AM  Session Stop: 10/09/2019 9:00:00 AM    Total Treatment Minutes: 60.00 Minutes    Occupational Therapy  Inpatient Rehabilitation Evaluation    Rehab Diagnosis: revealed stable R thalamus hemorrhagic infarct.  Demographics:            Age: 64Y            Gender: Male  Primary Language: English    Past Medical History: Past Medical History:  ? 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  ? Borderline hypertension    on meds in past; BP normal without meds recently  Past Surgical History:  ? CARDIAC ABLATION   04/15/2013    ablation of slow pathway for AVNRT  History of Present Illness: 64 y.o. male with past medical history significant  of hypertension who has not seen any physician for many years what brought into  emergency room by paramedics when he was found confused by his coworker.  CT of  the head done in emergency room showed acute hemorrhagic infarction in the right  thalamus.  At present patient is awake alert oriented to self however has been  intermittently confused.  He was admitted to the ICU, placed on Cardene drip for hypertensive urgency.  Neurosurgery and neurology were consulted. Etiology of ICH is suspected to be  due to hypertension. Neurosurgery recommends no indication for neurosurgical  intervention.  Weaned off Cardene drip.  Mental status has improved. Patient  reports generalized pain otherwise no specific complaints. Patient will be  started on Keppra for 7 days Blood pressure control goal below 140 systolic  patient was a started on hydralazine and amlodipine with good blood pressure  response.  patient is A/T/O X3 and following commands, his last BM was 5/4, he is passing  gas. Patient is tolerating a mechanical soft diet, he is working with PT, OT and  SLP, he had an external foley. Patient would benefit from  inpatient AR before  going home with his family. Covid as negative 5/3.   Date of Onset: 10/04/19   Date of Admission: 10/08/2019 4:57:00 PM  Premorbid Functional Level: Pt was ind w ADLs, IADL s PTA, including functional  ambulation without AD.  Social/Educational History: Pt is single, has 2 children who both live in [facility].  Home Environment: Pt reports he and his spouse (not his legal wife) are in a  room on the first floor.  Per pt report, full bathroom is up a flight of stairs.   Pt is partially the caregiver for his spouse who has (per patient) dementia.  Spouse's daughter is the primary caregiver, and is also the mother of six  children ages 56-14.  Patient lives with his spouse, her daughter, son-in-law and  their six children.  Per pt  he has an adult son in [place] who is visiting this  weekend.  Unknown if there are steps to enter the home.    Medications and Allergies: Significant rehabilitation considerations:   pls refer to [facility] for details  Rehabilitation Precautions/Restrictions:   Risk of aspiration/swallowing. Mechanical soft   Risk for falls    SUBJECTIVE  Patient Report: " this stroke really did me in"  Patient/Caregiver Goals:  Patient's functional goals: " I want to get stronger  and go home to my family, but I can't go back to work  now"  Pain: Patient currently without complaints of pain.  Pain Reassessment: Pain was not reassessed as no pain was reported.    OBJECTIVE  General Observation: Orders noted chart reviewed evaluation completed.  Appreciate hand off from RN, re ; no new medical concerns. pt in bed set up for  breakfast upon OT s arrival.  Understanding of Current Condition: Pt is aware he had a stroke.  Pt feels like  it has been a massive impact on him.  Pt with very slow processing .  Vital Signs:                       Before Activity              After Activity  Vitals  Position/Activity    seated                       seated  BP Systolic          117                          127  BP  Diastolic         70                           80  Pulse                100                          76    Cognition: Pt is A x O x 3. Pt initially asking orientation questions as to name  of hospital, recalled that information as well as OT s name within session. pt  presents w slow processing, slow movement, inability to self monitor for  situational awareness without external structure. Pt is able to provide  information regarding his family, job  and recent medical events. Noted some  stuttering about 15 % of the time during evaluation. For more detailed  information pls refer to SLP evaluation.  Vision:  pt wears reading glasses, reports no new visual deficits, however given  pt's diagnosis, recommend vision screen during subsequent OT sessions. Unable to  complete today due to time constraints.  Perception: noted mod L inattention of body, with occasional spontaneous  attempts of use of L UE. pt needed vc and tactile cues to move L foot during  SPT.    Upper Extremity Status   Tone: B UE normal tone   Range of Motion: R UE AROM WFL anti gravity  L UE AROM anti gravity shoulder flexion 80 degrees, AAROM WFL   L UE elbow flexion/extension WFL  L hand slowed movement patterns,  impaired FMC    L hand is gross A level  on FUEL scale   Fine Motor: L hand FMC impaired. on FUEL scale gross A level. pt has delayed  motor reactions, and L inattention      Strength/Motor Control: Pt requires mod  A for supine to sit to supine w  increased time due to delayed processing speed. Pt is able to sit unsupported at  EOB w L lateral lean. SPT transfer w mod A due to L inattention to body  Sensation: L UE impaired proprioception, light touch. intact for temperature    Interventions:  High Complexity Evaluation: An occupational  profile and medical and therapy  history, which includes review of medical and/or therapy records and extensive  additional review of physical, cognitive, or psychosocial history related to  current  functional performance  An assessment(s) that identify 5 or more performance deficits (i.e., relating to  physical, cognitive, or psychosocial skills) that result in activity limitations  and/or participation restrictions  Patient presents with co morbidities that affect occupational performance.  Significant modification of tasks or assistance (i.e., physical or verbal) with  assessment(s) is necessary to enable patient to complete evaluation component  Clinical decision-making is of high analytic complexity, which includes an  analysis of the patient profile, analysis of data from comprehensive  assessment(s), and consideration of multiple treatment options No treatment  provided today.  Patient was handed off to next therapist. Handoff to nurse completed.    Interdisciplinary Educational Needs and Learning Preferences:       Learning Preference: The patient's preferred learning method is:  Explanation.       Barriers to Learning: Cognitive limitations.       Learning Needs: Equipment, Functional activities/mobility, Plan of care,  Precautions, Stroke    Education Provided: Plan of care.       Audience: Patient.       Mode: Explanation.       Response: Verbalized understanding.    ASSESSMENT  Summary of Deficits and Prognosis: Mr. Rohl presents to AR s/p R thalamic  infarct. PTA, pt was ind with ADLs, IADL s, including caring for his spouse and  working as a Scientist, forensic. Currently pt presents w L inattention, impaired midline  orientation, impaired functional use of L hemi body, delayed processing and  situational awareness.  As a result pt requires assistance with all aspects of  ADL s and functional mob. Pt will benefit from skilled OT in an  interdisciplinary setting to address deficits and work towards goals and  identify safe [place] plan.      Delayed charting in order to reflect appropriate date and time of evaluation.    Rehab Potential: Able to participate in an intensive inpatient  interdisciplinary  rehabilitation program, Good premorbid functional status, Living in the  community premorbidly, Motivated  Barriers to Progress/Discharge: Architectural barriers in home, Functional  status    Short Term Goals:  Time frame to achieve short term goal(s):   1 week from evaluation 10/09/19       1. Pt will complete UB dressing, bathing w min  A  in a timely manner and  mod external structure .       2. Pt will complete LB bathing, dressing at a mod A level.       3. Pt will use L UE at a semi functional level ( FUEL scale) 50 % of the  time during ADL s with no more than 2 vc s.       4. Pt will consistently complete functional transfers at min A level w  LRAD.  Long Term Goals:   Time frame to achieve long term goal(s):   2-3 weeks from eval 10/09/19       1. Pt will utilize L UE at a Functional Assist level (FUEL scale) during  ADL s in order to complete UB dressing at a  mod I level.       2. Pt will utilize L UE at a Functional Assist level (FUEL scale) during  ADL s in order to complete LB bathing, dressing at a supervision level.  3. Pt will demonstrate improved midline orientation in order to complete  functional transfers at supervision level w RLAD.       4. Pt will complete toileting including transfers at a supervision level w  RLAD>       5. Pt's family/care giver will demonstrate ability to provide recommended  supervision assistance and safe environment in order to return home.    CARE Tool  The scores below reflect the patient's usual function prior to therapeutic  intervention.    Self-Care Functional Assessment  Eating: 04 - Supervision: Helper provides supervision for safety or verbal cues  ONLY  Assistive Device(s):  None    Oral Hygiene: 33 - Not attempted due to medical or safety concerns.   unable to stand to complete task due to  por midline orientation and L  inattention, poor processing speed . seated completes at min A level needing  external structure and physical A to  incorporate L UE    Toileting Hygiene: 02 - Substantial/Maximal Assistance: Helper does 51-75% of  the activity  Toileting Equipment:   Scientist, research (physical sciences) Device(s):   Grab bar    Shower/Bathe Self: 03 - Partial/Moderate Assistance: Helper does 26-50% of the  activity  Location:  Seated at sink  Assistive Device(s):   None    Upper Body Dressing:   10 - Not attempted due to environmental limitations.   no shirt available    Lower Body Dressing: 02 - Substantial/Maximal Assistance: Helper does 76-99% of  the activity  Assistive Device(s):   None    Putting On/Taking Off Footwear: 01 - Dependent: Helper does 100% of the activity    Assistive Device(s):   None    Mobility Functional Assessment  Roll Left and Right: 02 - Substantial/Maximal Assistance: Helper does 51-75% of  the activity  Assistive Device(s):   None    Sit to Lying: 02 - Substantial/Maximal Assistance: Helper does 51-75% of the  activity  Assistive Device(s):   None    Lying to Sitting on Side of Bed: 02 - Substantial/Maximal Assistance: Helper  does 51-75% of the activity  Assistive Device(s):   None   increased time    Sit to Stand: 03 - Partial/Moderate Assistance: Helper does 26-50% of the  activity  Assistive Device(s):   None    Chair/Bed-to-Chair Transfer: 02 - Substantial/Maximal Assistance: Helper does  51-75% of the activity  Assistive Device(s):   Arm rest    Toilet Transfer: 02 - Substantial/Maximal Assistance: Helper does 51-75% of the  activity  Assistive Device(s):   None    Picking Up Objects: 88 - Not attempted due to medical or safety concerns.    Self Care Discharge Goals:   Eating: Patient completed the activities by him/herself with no assistance from  a helper.   Oral Hygiene: Patient completed the activities by him/herself with no  assistance from a helper.   Toileting Hygiene: Helper provides verbal cues or touching/steadying assistance  as patient completes activity.   Shower/Bathe Self: Helper provides verbal cues or  touching/steadying assistance  as patient completes activity.   Upper Body Dressing: Patient completed the activities by him/herself with no  assistance from a helper.   Lower Body Dressing: Helper provides verbal cues or touching/steadying  assistance as patient completes activity.   Putting On/Taking Off Footwear: Helper provides verbal cues or  touching/steadying assistance as patient completes activity.      Risks/Benefits of Rehabilitation Discussed with Patient/Caregiver: Yes.  Recommendations/Goals for Rehabilitation Discussed  with Patient/Caregiver: Yes.    PLAN  Occupational Therapy Plan: Occupational Therapy is recommended.  Recommended Frequency/Duration/Intensity: 60-120 min per day, 5-6 times per week  1:1 treat and group PRN  Activities Contributing Toward Care Plan: Recommended  Frequency/Duration/Intensity: ther act, ther ex, ADL, IADL retraining,  functional transfer, mob for ADL s, modalities, NMRE, pt and family education,  and training,  stroke education, DME, D.C planning  Activities Contributing Toward Care Plan:  The patient is not appropriate for group treatment.    Team Care Plan  Please review Integrated Patient View Care Plan Flowsheet for Team identified  Problems, Interventions, and Goals.    Identified problems from team documentation:      Identified problems from this assessment:     Self Care Management : Pt will complete self care at a supervise/CGA level  provided by family.care giver in order to return home safely.    Discipline:  Occupational Therapy    3 Hour Rule Minutes: 60 minutes of OT treatment this session count towards  intensity and duration of therapy requirement. Patient was seen for the full  scheduled time of OT treatment this session.  Therapy Mode Minutes: Individual: 60 minutes.    Signed by: Milinda Antis, OT/L, OTD, SCLV 10/09/2019 9:00:00 AM

## 2019-10-13 NOTE — Progress Notes (Signed)
10/13/19 2317   Output (mL)   Post Void Residual Updated (mL) 232 mL   Unmeasured Output   Urine Occurrence 200

## 2019-10-13 NOTE — Rehab Progress Note ([facility]) (Signed)
Michael White White  MRN: [id number]  Account: [id number]  Session Start: 10/13/2019 12:00:00 AM  Session Stop: 10/13/2019 12:00:00 AM    Total Treatment Minutes:  Minutes    Rehabilitation Nursing  Inpatient Rehabilitation Shift Assessment    Rehab Diagnosis: revealed stable R thalamus hemorrhagic infarct.  Demographics:            Age: 69Y            Gender: Male  Primary Language: English    Date of Onset:  10/04/19  Date of Admission: 10/08/2019 4:57:00 PM    Rehabilitation Precautions Restrictions:   Risk of aspiration/swallowing. Mechanical soft  Risk for falls    Patient Report: " Iam workinng hard  to get better"  Patient/Caregiver Goals:  " I want to get better and walk safely again."    Wounds/Incisions: No wounds or incisions.    Medication Review: No clinically significant medication issues identified this  shift.    Bowel and Bladder Output:                       Bladder (# only)             Bowel (# only)  Number of Episodes  Continent            3                            0  Incontinent          0                            0    Additional Education Provided:    Education Provided: Medication. Medication. Name and dosage. Administration.  Purpose.       Audience: Patient.       Mode: Explanation.       Response: Verbalized understanding.    TEAM CARE PLAN  Identified problems from team documentation:  Problem: Impaired Cardiac Function  Cardiac: Primary Team Goal: Patient will display vital signs within  limits./Active  Cardiac: Additional Team Goal: Patient and caregiver will teack-back  BEFAST./Active    Problem: Impaired Cognition  Cognition: Primary Team Goal: Patient will demonstrate adequate attention and  visual / verbal processing to complete ADLs and mild / moderately complex  cognitive tasks for periods of 20 minutes provided min redirections./Active    Problem: Impaired Mobility  Mobility: Primary Team Goal: Pt will perform majority of mobility at a  supervision level with most appropriate  assistive device for increased safety  and indepednence upon discharge./Active    Problem: Impaired Psychosocial Skills/Behavior  PsychoSocial: Primary Team Goal: Increase adjustment to changes post-stroke and  his next phase of life/    Problem: Impaired Self-care Mgmt/ADL/IADL  Self Care: Primary Team Goal: Pt will complete self carea at a superivsio/CGA  level provided by family.care giver in order to return home safely./Active    Problem: Impaired Swallowing  Swallowing: Primary Team Goal: Patient will incorporate swallow strategies to  maintain adequate mastication and functional oral clearance in order to safely  tolerate regular texture/thin liquid diet in 100% opportunities mod I./Active    Problem: Safety Risk and Restraint  Safety: Primary Team Goal: Patient will display learned safety  precautions./Active    Please review Integrated Patient View Care Plan Flowsheet for Team identified  Problems, Interventions, and Goals.    Signed by:  Burley Saver, RN 10/13/2019 11:36:00 AM

## 2019-10-13 NOTE — Rehab Progress Note ([facility]) (Signed)
NAMEDESTINE AMBROISE  MRN: [id number]  Account: [id number]  Session Start: 10/12/2019 12:00:00 AM  Session Stop: 10/12/2019 12:00:00 AM    Total Treatment Minutes:  Minutes    Case Management  Inpatient Rehabilitation Team Conference    Conference Date/Time: 10/12/2019 2:18:00 PM    Demographics            Age: 39Y            Gender: Male    Admission Date: 10/08/2019 4:57:00 PM  Diagnosis: revealed stable R thalamus hemorrhagic infarct.  Comorbidities:    VITAL SIGNS  Blood Pressure: 118/68 mmHg  Temperature:  degrees  Pulse:  beats per minute  Respirations:  breaths per minute  Pain: 2/10    WEIGHT and NUTRITION  Admission Weight: 144 pounds; Current Weight: 144pounds  Weight Change since Admit: Patient has had no weight change since admission.  Food Consistency:  Liquid Consistency:None    Plan Of Care  Anticipated Discharge Date/Estimated Length of Stay: 10/21/19  Anticipated Discharge Destination: Community discharge with assistance  Fall Risk Level: Yellow (Medium)  Medical Necessity Expected Level Rationale: CVA  Intensity and Duration: an average of 3 hours/5 days per week  Medical Supervision and 24 Hour Rehab Nursing: x  Physical Therapy: x  PT Intensity/Duration: 60-197m/d x 10-14d  Occupational Therapy: x  OT Intensity/Duration: 60-156m/d x 10-14d  Speech and Language Therapy: x  SLP Intensity/Duration: 60-113m/d x 10-14d  Case Management: Paulla Fore, CM  Nurse: Veryl Speak, RN  Occupational Therapy: Elsie Amis, OT  Physical Therapy: Jaquita Rector, PT  Physician: Eppie Gibson, MD  Speech Language Pathology: Jamison Neighbor, SLP  Therapeutic Recreation: Pearson Forster, TR    The following is a list of patient problems that have been identified by the  interdisciplinary team:    Problem: Impaired Cardiac Function  Cardiac Status Update: Patient needs BP monitoring and stroke education.  Interventions:  Administer cardiac medications as prescribed monitoring for side effects: Active  Monitor vital  signs/hemodynamics and report changes: Active  Provide structured rest periods: Active  Patient/family/caregiver education: Active  Cardiac: Primary Team Goal/Status: Patient will display vital signs within  limits. / Active  Cardiac: Additional Team Goal/Status: Patient and caregiver will teack-back  BEFAST. / Active    Problem: Impaired Cognition  Team Identified Barrier to Discharge: Yes  Interventions:  Decrease environmental stimuli: Active  Safety awareness training: Active  Compensatory strategies: Active  Cognition: Primary Team Goal/Status: Patient will demonstrate adequate attention  and visual / verbal processing to complete ADLs and mild / moderately complex  cognitive tasks for periods of 20 minutes provided min redirections. / Active    Problem: Impaired Mobility  Mobility Status Update: Pt with very delayed processing, needing significantly  increased time with all mobility.  When given adequate time he is able to  complete bed mobility with S, stand and transfer with Min A, and ambulate 15'  with CGA using RW.  Interventions:  Transfer training: Active  Gait training: Active  Mobility: Primary Team Goal/Status: Pt will perform majority of mobility at a  supervision level with most appropriate assistive device for increased safety  and indepednence upon discharge. / Active    Problem: Impaired Self-care Mgmt/ADL/IADL  Team Identified Barrier to Discharge: Yes  Interventions:  Adaptive equipment training: Active  Compensatory strategies: Active  Encourage patient to participate in activities of daily living: Active  Self Care: Primary Team Goal/Status: Pt will complete self carea at a  superivsio/CGA level provided by family.care  giver in order to return home  safely. / Active    Problem: Impaired Swallowing  Team Identified Barrier to Discharge: Yes  Interventions:  Monitor for signs and symptoms of aspiration: Active  Encourage/facilitated utilization of compensatory strategies for safe oral  intake:  Active  Swallowing: Primary Team Goal/Status: Patient will incorporate swallow  strategies to maintain adequate mastication and functional oral clearance in  order to safely tolerate regular texture/thin liquid diet in 100% opportunities  mod I. / Active    Problem: Safety Risk and Restraint  Safety Risk Status Update: Patient needs reminders to call for assistance prior  to ambulation.  Interventions:  Identify patient at risk for falls: Active  Call light within reach: Active  Orient patient/family/caregiver to room and equipment: Active  Stay with patient in bathroom: Active  Bed alarm/wheelchair alarm: Active  Anticipate needs, including personal items within reach: Active  Safety: Primary Team Goal/Status: Patient will display learned safety  precautions. / Active    Self Care Functional Status  Eating:  2 - Substantial/maximal assistance  Oral Hygiene:  5 - Setup or clean-up assistance  Toileting:  4 - Supervision or touching assistance  Shower Bathe:  22 - Not attempted due to medical condition or safety concern  Upper Body Dressing:  2 - Substantial/maximal assistance  Lower Body Dressing:  2 - Substantial/maximal assistance  Putting On/Taking Off Footwear:  2 - Substantial/maximal assistance    Mobility Functional Status  Roll Left and Right:  4 - Supervision or touching assistance  Sit to Lying:  4 - Supervision or touching assistance  Lying to Sitting on Side of Bed:  4 - Supervision or touching assistance  Sit to Stand:  3 - Partial/moderate assistance  Chair/Bed-to-Chair Transfer:  3 - Partial/moderate assistance  Toilet Transfer:  26 - Not attempted due to medical condition or safety concern  Car Transfer:  23 - Not attempted due to medical condition or safety concern  Walk 10 Feet:  4 - Supervision or touching assistance  Walk 50 Feet with Two Turns:  88 - Not attempted due to medical condition or  safety concern  Walking 150 Feet:  88 - Not attempted due to medical condition or safety  concern  Walking 10 Feet on Uneven Surface:  88 - Not attempted due to medical condition  or safety concern  1 Step - Curb:  88 - Not attempted due to medical condition or safety concern  4 Steps:  25 - Not attempted due to medical condition or safety concern  12 Steps:  26 - Not attempted due to medical condition or safety concern  Picking Up an Object:  57 - Not attempted due to medical condition or safety  concern  Wheel 50 Feet with Two Turns:  1 - Dependent - 1 Helper  Wheel 150 Feet:  1 - Dependent - 1 Helper      Comments: None    Signed by: Paulla Fore, MSW 10/12/2019 3:16:00 PM    Physician CoSigned By: Consuello Masse 10/13/2019 11:49:49

## 2019-10-13 NOTE — Rehab PSY Consult ([facility]) (Signed)
[name]  MRN: [id number]  Account: [id number]  Session Start: 10/12/2019 3:00:00 PM  Session Stop: 10/12/2019 4:00:00 PM    Total Treatment Minutes: 60.00 Minutes    Psychology Services  Inpatient Rehabilitation Consultation    Rehab Diagnosis: revealed stable R thalamus hemorrhagic infarct.  Demographics:            Age: 64Y            Gender: Male    Past Medical History: Past Medical History:  ? 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  ? Borderline hypertension    on meds in past; BP normal without meds recently  Past Surgical History:  ? CARDIAC ABLATION   04/15/2013    ablation of slow pathway for AVNRT  History of Present Illness: 64 y.o. male with past medical history significant  of hypertension who has not seen any physician for many years what brought into  emergency room by paramedics when he was found confused by his coworker.  CT of  the head done in emergency room showed acute hemorrhagic infarction in the right  thalamus.  At present patient is awake alert oriented to self however has been  intermittently confused.  He was admitted to the ICU, placed on Cardene drip for hypertensive urgency.  Neurosurgery and neurology were consulted. Etiology of ICH is suspected to be  due to hypertension. Neurosurgery recommends no indication for neurosurgical  intervention.  Weaned off Cardene drip.  Mental status has improved. Patient  reports generalized pain otherwise no specific complaints. Patient will be  started on Keppra for 7 days Blood pressure control goal below 140 systolic  patient was a started on hydralazine and amlodipine with good blood pressure  response.  patient is A/T/O X3 and following commands, his last BM was 5/4, he is passing  gas. Patient is tolerating a mechanical soft diet, he is working with PT, OT and  SLP, he had an external foley. Patient would benefit from inpatient AR before  going  home with his family. Covid as negative 5/3.            Date of Onset: 10/04/19            Date of Admission: 10/08/2019 4:57:00 PM    Medications and Allergies: Significant rehabilitation considerations:   Please see EPIC.  Rehabilitation Precautions/Restrictions:   Risk of aspiration/swallowing. Mechanical soft  Risk for falls    OBJECTIVE  Education Level:  13 years  Behavioral Observations and Mental Status:  The patient was seen for an initial  psychological consultation in the patient?s room to assess for adjustment to  medical situation and rehabilitation hospitalization in accordance with the  attending physician?s initial plan of care. The patient was found lying in his  bed and his wife was present. He was alert and cooperative. Michael White was  oriented to person, place, time, date (but not day of the week, stated ?Sunday?  and it was Tuesday) and situation. Affect was anxious, and he made appropriate  eye contact. He was engaging throughout the session and demonstrated spontaneous  speech. Personal grooming and hygiene were good. Speech was of normal rhythm,  volume, rate, and prosody.  Processing speed was slow but adequate for this  session. Behavioral signs of pain were not observed. There was no evidence of  him responding to internal stimuli, and there was no delusional content in his  speech. At the time of the session the patient did not appear to be in or report  any acute distress. Thought process was linear and goal oriented; content was  appropriate. Patient did not express any suicidal ideation, intent, and/or plan.        Interdisciplinary Educational Needs and Learning Preferences:       Learning Preference: The patient's preferred learning method is:  Explanation.       Barriers to Learning: Cognitive limitations.       Learning Needs: Leisure/vocational, Plan of care, Rehabilitation techniques  and procedures, Stroke    Education Provided: Stroke. Stroke recovery, Emotional  adjustments/depression       Audience: Patient and significant other.       Mode: Explanation.       Response: Verbalized understanding.    Interventions:   NPSY HLTH ASSESS/REASSES    ASSESSMENT  Impressions:  Relevant background information was gathered. The patient reported  feeling down and worries related to functional decline and how to provide  financially for his family. The patient was realistic about his inability to  return to work, and stated that he wanted to retire. He was given space to  express frustrations and concerns which largely revolved around planning next  steps in his life and adjusting to retirement. He explored family whom he trusts  to help with planning and other executive functioning tasks. 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 Other  symptoms of psychopathology were not endorsed. He did not report cognitive  decline prior to his stroke, and on a cognitive screener, he scored in the  impaired range (19/30) which errors primarily in executive functioning and  attention tasks (on clock drawing numbers were mostly on the right side).  Potential to Benefit: Good family/social support, Good premorbid functional  status, Good premorbid medical status, Living in the community premorbidly  Barriers to Progress/Discharge: No potential barriers to progress.      Goals:  Time frame to achieve short term goal(s): 2 weeks       1. Increase adjustment to changes post-stroke and his next phase of life    PLAN  Psychology services are recommended to address: Increase adjustment to changes  post-stroke and his next phase of life  Recommendations:  Individual and co-treat as needed    Care Plan  Identified problems from team documentation:  Problem: Impaired Cardiac Function  Cardiac: Primary Team Goal: Patient will display vital signs within  limits./Active  Cardiac: Additional Team Goal: Patient and caregiver will teack-back  BEFAST./Active    Problem:  Impaired Cognition  Cognition: Primary Team Goal: Patient will demonstrate adequate attention and  visual / verbal processing to complete ADLs and mild / moderately complex  cognitive tasks for periods of 20 minutes provided min redirections./Active    Problem: Impaired Mobility  Mobility: Primary Team Goal: Pt will perform majority of mobility at a  supervision level with most appropriate Assistive device for increased safety  and independence upon discharge./Active    Problem: Impaired Self-care Mgmt/ADL/IADL  Self Care: Primary Team Goal: Pt will complete self care at a superivsio/CGA  level provided by family.care giver in order to return home safely./Active    Problem: Impaired Swallowing  Swallowing: Primary Team Goal: Patient will incorporate swallow strategies to  maintain adequate mastication and functional oral clearance in order to safely  tolerate regular texture/thin liquid diet in 100% opportunities mod I./Active    Problem: Safety Risk and Restraint  Safety: Primary  Team Goal: Patient will display learned safety  precautions./Active    Identified problems from this assessment:     Psychosocial : Increase adjustment to changes post-stroke and his next phase of  life    Discipline:  Neuropsychology/Psychology    Please review Integrated Patient View Care Plan Flowsheet for Team identified  Problems, Interventions, and Goals.    Signed by: Raynald Blend, PhD 10/12/2019 4:00:00 PM

## 2019-10-13 NOTE — Rehab Progress Note ([facility]) (Signed)
Michael White  MRN: [id number]  Account: [id number]  Session Start: 10/13/2019 2:00:00 PM  Session Stop: 10/13/2019 3:00:00 PM    Total Treatment Minutes: 60.00 Minutes    Occupational Therapy  Inpatient Rehabilitation Treatment Note    Rehab Diagnosis: revealed stable R thalamus hemorrhagic infarct.  Demographics:            Age: 71Y            Gender: Male  Rehabilitation Precautions/Restrictions:   Risk for falls    Vital Signs: see EPIC    Interventions:       Therapeutic Activities:  Patient participates in OT session with focus on L  attention, processing, and problem solving. Patient uses [place] pegs in  activity, and requires /R/30 minutes to complete sorting. Last half of session  focuses on family training and stroke recovery education. Therapist also  educates patient and son on recommended discharge date and rehab process for  remainder of IRF time and post discharge. Both verbalize understanding. Patient  otherwise returns to bed with min A for stand pivot transfer to bed toward his  right.  Patient was returned to or left in bed.   son present.  Handoff to nurse completed.  Bed alarm in place and activated.  Oriented to call bell and placed within reach.  Personal items within reach.    ASSESSMENT  Benefits from activities to promote processing, and L attention. Currently  discharge plan is to SNF, therefore recommend maximize function prior to  discharge.    3 Hour Rule Minutes: 60 minutes of OT treatment this session count towards  intensity and duration of therapy requirement. Patient was seen for the full  scheduled time of OT treatment this session.  Therapy Mode Minutes: Individual: 60 minutes.    Signed by: Elsie Amis, OTR/L 10/13/2019 3:00:00 PM

## 2019-10-13 NOTE — Rehab Progress Note ([facility]) (Signed)
[name]  MRN: [id number]  Account: [id number]  Session Start: 10/13/2019 1:00:00 PM  Session Stop: 10/13/2019 2:00:00 PM    Total Treatment Minutes: 60.00 Minutes    Speech Language Pathology  Inpatient Rehabilitation Treatment Note    Rehab Diagnosis: revealed stable R thalamus hemorrhagic infarct.  Demographics:            Age: 61Y            Gender: Male  Rehabilitation Precautions/Restrictions:   Risk for falls    Interventions:       Swallowing:  Swallow intervention assessed utilization of safe swallow  precautions with a regular texture meal trial. SLP session during lunch meal -  chicken salad sandwich, saltine crackers, chocolate chip cookie. Patient  self-fed independently via small bites at a regulated pace without cueing. No  instances of oral pocketing or residuals. Hyolaryngeal excursion achieved on  100% trials, no overt s/s aspiration / penetration indicated.       Speech Treatment: SLP and patient discussed processing delay in relation to  meal completion. He demonstrated functional reasoning skills while identifying  ideal time to complete his meal, while factoring in new processing and attention  deficits. Timer used to promote visual and auditory feedback, and increase task  efficiency. SLP provided minimal verbal prompt / repetition in order to increase  motor task initiation in 100% activity, ie calling spouse. Attention and working  memory targeted with L/R differentiation, requiring minimal verbal visual  reminders for L scanning and to check off completed work throughout task.    Patient was left seated in chair at nurse's station. Handoff to nurse completed.  Chair alarm in place and activated.  Oriented to call bell and placed within reach.  Personal items within reach.    ASSESSMENT  Pt upgraded to regular texture solids. SLP updated white board and notified RN.  As compared to earlier speech sessions, he demonstrates increased carryover of L  attention strategies in highly  structured context, and demonstrated good  carryover of strategies to optimize working memory.    3 Hour Rule Minutes: 60 minutes of SLP treatment this session count towards  intensity and duration of therapy requirement. Patient was seen for the full  scheduled time of SLP treatment this session.  Therapy Mode Minutes: Individual: 60 minutes.    Signed by: Jamison Neighbor, M.A., CCC/SLP 10/13/2019 2:00:00 PM

## 2019-10-14 LAB — CBC
Absolute NRBC: 0 10*3/uL (ref 0.00–0.00)
Hematocrit: 41.8 % (ref 37.6–49.6)
Hgb: 13.1 g/dL (ref 12.5–17.1)
MCH: 21.2 pg — ABNORMAL LOW (ref 25.1–33.5)
MCHC: 31.3 g/dL — ABNORMAL LOW (ref 31.5–35.8)
MCV: 67.6 fL — ABNORMAL LOW (ref 78.0–96.0)
MPV: 9.4 fL (ref 8.9–12.5)
Nucleated RBC: 0 /100 WBC (ref 0.0–0.0)
Platelets: 432 10*3/uL — ABNORMAL HIGH (ref 142–346)
RBC: 6.18 10*6/uL — ABNORMAL HIGH (ref 4.20–5.90)
RDW: 22 % — ABNORMAL HIGH (ref 11–15)
WBC: 6.9 10*3/uL (ref 3.10–9.50)

## 2019-10-14 LAB — URINALYSIS REFLEX TO MICROSCOPIC EXAM - REFLEX TO CULTURE
Bilirubin, UA: NEGATIVE
Blood, UA: NEGATIVE
Glucose, UA: NEGATIVE
Ketones UA: NEGATIVE
Leukocyte Esterase, UA: NEGATIVE
Nitrite, UA: NEGATIVE
Protein, UR: NEGATIVE
Specific Gravity UA: 1.02 (ref 1.001–1.035)
Urine pH: 6 (ref 5.0–8.0)
Urobilinogen, UA: NEGATIVE mg/dL (ref 0.2–2.0)

## 2019-10-14 NOTE — Progress Notes (Signed)
NAMEVERLAN GROTZ  MRN: [id number]  Account: [id number]  Session Start: 10/14/2019 3:00:00 PM  Session Stop: 10/14/2019 4:00:00 PM    Total Treatment Minutes:  Minutes    Inpatient Rehabilitation Group Note    Rehab Diagnosis: revealed stable R thalamus hemorrhagic infarct.  Demographics:            Age: 56Y            Gender: Male  Rehabilitation Precautions/Restrictions:   Risk for falls    SUBJECTIVE  Patient Report: "It was good information."    OBJECTIVE  Group Type: Peer mentor support group was held in dayroom setting.  Group Participant(s): Patient    Treatment Provided: Participants actively listened to survivors' stories of  injury, hospitalization, recovery, and community re-entry.    ASSESSMENT  Participant(s) benefitted from support and encouragement of peers in a social  setting.  Benefitted from psychosocial stimulation in a group setting    Signed by: Pearson Forster, CTRS 10/14/2019 4:00:00 PM

## 2019-10-14 NOTE — Rehab Progress Note ([facility]) (Signed)
Michael White  MRN: [id number]  Account: [id number]  Session Start: 10/14/2019 1:00:00 PM  Session Stop: 10/14/2019 2:00:00 PM    Total Treatment Minutes: 60.00 Minutes    Occupational Therapy  Inpatient Rehabilitation Treatment Note    Rehab Diagnosis: revealed stable R thalamus hemorrhagic infarct.  Demographics:            Age: 1Y            Gender: Male  Rehabilitation Precautions/Restrictions:   Risk for falls    Vital Signs:                       Before Activity              After Activity  Vitals  BP Systolic          152                          -  BP Diastolic         82                           -  Pulse                103                          -    Interventions:       Self Care/Home Management:  Received patient received from nursing with no  complaints and having received all necessary medications. Patient appears to be  mildly more confused than previously and requires extra cues and ultimately  assist for dialing phone number of his son. Patient also demonstrates decreased  memory and awareness of this Clinical research associate despite working together consistently for  last three days. He reports need to void and is assisted to stand with  significant left lateral lean noted and decreased ability to self correct  although is able to correct with min assist and tactile cues. Urine is noted to  be bloody and nurse is notified. He is assisted to change pants and underwear  with max assist.  Patient was handed off to next therapist. Handoff to nurse completed.    ASSESSMENT  Continue addressing balance and cognitive deficits impacting performance and  safety with self care.    3 Hour Rule Minutes: 60 minutes of OT treatment this session count towards  intensity and duration of therapy requirement. Patient was seen for the full  scheduled time of OT treatment this session.  Therapy Mode Minutes: Individual: 60 minutes.    Signed by: Elsie Amis, OTR/L 10/14/2019 2:00:00 PM

## 2019-10-14 NOTE — Rehab Progress Note ([facility]) (Signed)
Michael White  MRN: [id number]  Account: [id number]  Session Start: 10/14/2019 8:00:00 AM  Session Stop: 10/14/2019 9:00:00 AM    Total Treatment Minutes: 60.00 Minutes    Speech Language Pathology  Inpatient Rehabilitation Treatment Note    Rehab Diagnosis: revealed stable R thalamus hemorrhagic infarct.  Demographics:            Age: 67Y            Gender: Male  Rehabilitation Precautions/Restrictions:   Risk for falls    Interventions:       Speech Treatment: SLP intervention targeted processing and attention in  order to improve task efficiency.       Speech Treatment: Pt. completed multiple routine tasks this morning,  including several transfers (ie bed/chair, chair/toilet), oral care, toileting,  and grooming. SLP provided verbal priming cues at the initial portion of each  task, reminding patient of task goal (decreased time of completion / decreased  prompts). Patient successfully sequenced and executed each task, with overall  decreased cues as session continued. For example, out of bed transfer required  >8 verbal prompts in span of five minutes although by end of ADL routine pt  required average of 2-3 prompts / task in 4-5 min span. Symbol cancellation  targeting visual attention, completed with 100% acc (40/40) with SLP priming  prompts + two additional cues near end of four minute span.  Patient was left seated in chair at nurse's station. Handoff to nurse completed.  Chair alarm in place and activated.  Personal items within reach.    ASSESSMENT  Pt demonstrated functional carryover of initial priming cues, which explained  task goal and purpose. Pt continues to present with apparent L inattention in  baseline mannerisms and benefitted from tactile cueing to alter head/neck  positioning to midline.    3 Hour Rule Minutes: 60 minutes of SLP treatment this session count towards  intensity and duration of therapy requirement. Patient was seen for the full  scheduled time of SLP treatment this  session.  Therapy Mode Minutes: Individual: 60 minutes.    Signed by: Jamison Neighbor, M.A., CCC/SLP 10/14/2019 9:00:00 AM

## 2019-10-14 NOTE — Progress Notes (Signed)
PHYSICAL MEDICINE AND REHABILITATION  PROGRESS NOTE  FACE TO Bruce Donath    Date Time: 10/14/19 5:21 PM    Patient Name: Michael White, Michael SR.  Admission date:  10/08/2019  (LOS: 6 days)    Subjective:     Patient has no new complaints. Denies pain. Tolerating therapies.       Functional Status:     Interventions:       Speech Treatment: SLP intervention targeted processing and attention in  order to improve task efficiency.       Speech Treatment: Pt. completed multiple routine tasks this morning,  including several transfers (ie bed/chair, chair/toilet), oral care, toileting,  and grooming. SLP provided verbal priming cues at the initial portion of each  task, reminding patient of task goal (decreased time of completion / decreased  prompts). Patient successfully sequenced and executed each task, with overall  decreased cues as session continued. For example, out of bed transfer required  >8 verbal prompts in span of five minutes although by end of ADL routine pt  required average of 2-3 prompts / task in 4-5 min span. Symbol cancellation  targeting visual attention, completed with 100% acc (40/40) with SLP priming  prompts + two additional cues near end of four minute span.    Medications:   Medication reviewed by me:     Scheduled Meds: PRN Meds:    amLODIPine, 5 mg, Oral, Daily  hydrALAZINE, 50 mg, Oral, Q8H  levETIRAcetam, 750 mg, Oral, Q12H SCH  senna-docusate, 2 tablet, Oral, Daily  tamsulosin, 0.4 mg, Oral, Daily after dinner  traZODone, 50 mg, Oral, QHS        Continuous Infusions:   acetaminophen, 650 mg, Q6H PRN  bisacodyl, 10 mg, Daily PRN  cyclobenzaprine, 5 mg, TID PRN  lactulose, 20 g, Q4H PRN  lidocaine, , PRN  ondansetron, 4 mg, Q8H PRN          Medication Review  1. A complete drug regimen review was completed: Yes  2. Were drug issues were found during review: No   If yes to drug issues found during review:   What was the issue?:    What was the time the issue was identified?:    Was I  contacted and action was taken by midnight of the next calendar day once issue was identified?: N/A    Person who contacted me:    Action taken:     Review of Systems:   A comprehensive review of systems was: No fevers, chills, nausea, vomiting, chest pain, shortness of breath, cough, headache, double vision.  All others negative.    Labs:     Recent Labs   Lab 10/14/19  0503 10/11/19  0601 10/09/19  0534 10/08/19  0352   WBC 6.90 7.25 6.30 8.01   Hgb 13.1 13.6 13.0 13.3   Hematocrit 41.8 44.0 41.8 43.1   Platelets 432* 371* 327 333      Recent Labs   Lab 10/11/19  0601 10/08/19  1919 10/08/19  0352   Sodium 136 135* 134*   Potassium 4.6 4.0 4.6   Chloride 100 101 103   CO2 25 25 18*   BUN 13 14 13.0   Creatinine 0.9 0.9 0.8   Calcium 9.2 9.0 8.8   Albumin  --  3.7  --    Protein, Total  --  6.8  --    Bilirubin, Total  --  0.8  --    Alkaline Phosphatase  --  82  --    ALT  --  14  --    AST (SGOT)  --  11  --    Glucose 93 119* 86         Rads:   Radiological Procedure reviewed.  Radiology Results (24 Hour)     ** No results found for the last 24 hours. **        Physical Exam:     Vitals:    10/14/19 0543 10/14/19 0926 10/14/19 1330 10/14/19 1458   BP: 152/86 132/80 152/82 127/78   Pulse: 75 93 (!) 103 (!) 105   Resp: 20      Temp: 97.7 F (36.5 C)      TempSrc: Oral      SpO2: 100% 98%     Weight:       Height:           Intake and Output Summary (Last 24 hours) at Date Time    Intake/Output Summary (Last 24 hours) at 10/14/2019 1721  Last data filed at 10/14/2019 0615  Gross per 24 hour   Intake 370 ml   Output 572 ml   Net -202 ml     P.O.: 170 mL (10/13/19 2100)     Urine: 172 mL (10/14/19 0615)  Bladder Scan Volume (mL): 219 mL (10/13/19 0339)  Intermittent/Straight Cath (mL): (Patient refused) (10/11/19 0537)       Cardiac: regular rhythm  Chest / Lungs: ctax2  Abdomen: +BS  Extremities: no calf tenderness.    Assessment and Plan:     The following medical/functional conditions and comorbidities may be  complicating factors in their comprehensive rehabilitation program.    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 No pharmacologics due to intracranial bleed    #Bowel/Bladder Management: PVRs to rule out urinary retention. Use Senna and Colace    #Muscle cramps: Patient states these are chronic for >12 years which he attributes to constantly moving heavy furniture  Flexeril 5 mg TID  Follow up CK levels    #Skin Care: Keep skin clean and dry    #Comorbities (HTN): Continue Norvasc, Hydralazine    #Psych (hx tobacco dependence-Quit Sep 13, 2019): Psychology services for evaluation and treatment as needed for adjustment/coping   Endorses a lot of stressors including financial and family given his wife has dementia and he is the bread winner.    #FEN/GI (Dysphagia): Mechanical soft diet, SLP to follow    #Dispo: TBD pending rehab progress      Continue comprehensive and intensive inpatient rehab program, including:   Physical therapy 60-120 min daily, 5-6 times per week, Occupational therapy 60-120 min daily, 5-6 times per week, Case management and Rehabilitation nursing.  Psychology services to evaluate and treat as needed     Signed by: Cheryll Dessert MD  10/14/2019, 5:21 PM    [facility]

## 2019-10-14 NOTE — Rehab Progress Note ([facility]) (Signed)
Michael White  MRN: [id number]  Account: [id number]  Session Start: 10/14/2019 12:00:00 AM  Session Stop: 10/14/2019 12:00:00 AM    Total Treatment Minutes:  Minutes    Rehabilitation Nursing  Inpatient Rehabilitation Shift Assessment    Rehab Diagnosis: revealed stable R thalamus hemorrhagic infarct.  Demographics:            Age: 17Y            Gender: Male  Primary Language: English    Date of Onset:  10/04/19  Date of Admission: 10/08/2019 4:57:00 PM    Rehabilitation Precautions Restrictions:   Risk for falls    Patient Report: Nice to meet you  Patient/Caregiver Goals:  " I want to get better and walk safely again."    Wounds/Incisions: No wounds or incisions.    Medication Review: No clinically significant medication issues identified this  shift.    Bowel and Bladder Output:                       Bladder (# only)             Bowel (# only)  Number of Episodes  Continent            3                            0  Incontinent          0                            0    Additional Education Provided:    Education Provided: Plan of care. Functional transfers. Cognitive functioning.  Stroke. Signs /T/ Symptoms associated with stroke, Stroke prevention, Medication  compliance, Control blood pressure, high cholesterol and/or diabetes, Increase  physical activity, Stroke recovery, Emotional adjustments/depression,  Tone/ROM/spasticity, Caregiver stress, Supervision/assistance requirements       Audience: Patient and Caregiver       Mode: Explanation.  Demonstration.  Stroke ed booklet.       Response: Applied knowledge.  Verbalized understanding.  Demonstrated skill.  Needs practice.  Needs reinforcement.    TEAM CARE PLAN  Identified problems from team documentation:  Problem: Impaired Cardiac Function  Cardiac: Primary Team Goal: Patient will display vital signs within  limits./Active  Cardiac: Additional Team Goal: Patient and caregiver will teack-back  BEFAST./Active    Problem: Impaired Cognition  Cognition:  Primary Team Goal: Patient will demonstrate adequate attention and  visual / verbal processing to complete ADLs and mild / moderately complex  cognitive tasks for periods of 20 minutes provided min redirections./Active    Problem: Impaired Mobility  Mobility: Primary Team Goal: Pt will perform majority of mobility at a  supervision level with most appropriate assistive device for increased safety  and indepednence upon discharge./Active    Problem: Impaired Psychosocial Skills/Behavior  PsychoSocial: Primary Team Goal: Increase adjustment to changes post-stroke and  his next phase of life/    Problem: Impaired Self-care Mgmt/ADL/IADL  Self Care: Primary Team Goal: Pt will complete self carea at a superivsio/CGA  level provided by family.care giver in order to return home safely./Active    Problem: Impaired Swallowing  Swallowing: Primary Team Goal: Patient will incorporate swallow strategies to  maintain adequate mastication and functional oral clearance in order to safely  tolerate regular texture/thin liquid diet in 100% opportunities mod I./Active  Problem: Safety Risk and Restraint  Safety: Primary Team Goal: Patient will display learned safety  precautions./Active    Please review Integrated Patient View Care Plan Flowsheet for Team identified  Problems, Interventions, and Goals.    Signed by: Forde Dandy, RN 10/14/2019 4:43:00 PM

## 2019-10-14 NOTE — Progress Notes (Signed)
10/14/19 0615   Output (mL)   Urine 172 mL   Post Void Residual Updated (mL) 271 mL

## 2019-10-15 ENCOUNTER — Inpatient Hospital Stay: Payer: Medicare Other

## 2019-10-15 NOTE — Rehab Progress Note ([facility]) (Signed)
NAMEATHA MCBAIN  MRN: [id number]  Account: [id number]  Session Start: 10/14/2019 2:00:00 PM  Session Stop: 10/14/2019 3:00:00 PM    Total Treatment Minutes: 60.00 Minutes    Physical Therapy  Inpatient Rehabilitation Treatment Note    Rehab Diagnosis: revealed stable R thalamus hemorrhagic infarct.  Demographics:            Age: 70Y            Gender: Male  Rehabilitation Precautions/Restrictions:   Risk of aspiration/swallowing. Mechanical soft  Risk for falls    OBJECTIVE  Vital Signs:                       Before Activity              After Activity  Vitals  BP Systolic          152                          127  BP Diastolic         82                           78  Pulse                103                          105    Interventions:       Therapeutic Activities:  Pt received in bathroom with hand off from OT.  After urinating a small amount he stands from toilet with CGA and grab bar, and  pulls up pants while PT provides Min A for steadying.  When placed in front of  sink he's able to wash his hands independently with increased time; twice taking  increased time to initiate continuation of task.  Session focus primarily on  gait training with RW.  He ambulates 40' x 2 and 95' x 1, all with CG/SBA.  Pt's  son present during session and provided with stroke education.  Patient was handed off to next therapist.    ASSESSMENT  Pt noted to be a little more confused today as compared to earlier in the week,  agree with RN in suspicion of possible UTI.  Despite this pt demos slight  improvements in initiation and bradykinesia, able to ambulate longer functional  distances today.    3 Hour Rule Minutes: 60 minutes of PT treatment this session count towards  intensity and duration of therapy requirement. Patient was seen for the full  scheduled time of PT treatment this session.  Therapy Mode Minutes: Individual: 60 minutes.    Signed by: Jaquita Rector, PT 10/14/2019 3:00:00 PM

## 2019-10-15 NOTE — Rehab Progress Note ([facility]) (Signed)
Michael White  MRN: [id number]  Account: [id number]  Session Start: 10/15/2019 12:00:00 AM  Session Stop: 10/15/2019 12:00:00 AM    Total Treatment Minutes:  Minutes    Rehabilitation Nursing  Inpatient Rehabilitation Shift Assessment    Rehab Diagnosis: revealed stable R thalamus hemorrhagic infarct.  Demographics:            Age: 48Y            Gender: Male  Primary Language: English    Date of Onset:  10/04/19  Date of Admission: 10/08/2019 4:57:00 PM    Rehabilitation Precautions Restrictions:   Risk of aspiration/swallowing. Mechanical soft  Risk for falls    Patient Report: "I'm feeling homesick"  Patient/Caregiver Goals:  " I want to get better and walk safely again."    Wounds/Incisions: No wounds or incisions.    Medication Review: No clinically significant medication issues identified this  shift.    Bowel and Bladder Output:                       Bladder (# only)             Bowel (# only)  Number of Episodes  Continent            2                            0  Incontinent          0                            0    Additional Education Provided:    Education Provided: Precautions. Pain management. Pain scale. Medication  options. Side effects. Clinical indicators of pain. Safety issues and  interventions. Fall protocol. Repeated re-orientation. Impulsivity. Medication.  Name and dosage. Administration. Purpose. Side Effects.       Audience: Patient.       Mode: Explanation.       Response: Verbalized understanding.  Needs reinforcement.    TEAM CARE PLAN  Identified problems from team documentation:  Problem: Impaired Cardiac Function  Cardiac: Primary Team Goal: Patient will display vital signs within  limits./Active  Cardiac: Additional Team Goal: Patient and caregiver will teack-back  BEFAST./Active    Problem: Impaired Cognition  Cognition: Primary Team Goal: Patient will demonstrate adequate attention and  visual / verbal processing to complete ADLs and mild / moderately complex  cognitive tasks for  periods of 20 minutes provided min redirections./Active    Problem: Impaired Mobility  Mobility: Primary Team Goal: Pt will perform majority of mobility at a  supervision level with most appropriate assistive device for increased safety  and indepednence upon discharge./Active    Problem: Impaired Psychosocial Skills/Behavior  PsychoSocial: Primary Team Goal: Increase adjustment to changes post-stroke and  his next phase of life/    Problem: Impaired Self-care Mgmt/ADL/IADL  Self Care: Primary Team Goal: Pt will complete self carea at a superivsio/CGA  level provided by family.care giver in order to return home safely./Active    Problem: Impaired Swallowing  Swallowing: Primary Team Goal: Patient will incorporate swallow strategies to  maintain adequate mastication and functional oral clearance in order to safely  tolerate regular texture/thin liquid diet in 100% opportunities mod I./Active    Problem: Safety Risk and Restraint  Safety: Primary Team Goal: Patient will display learned safety  precautions./Active  Please review Integrated Patient View Care Plan Flowsheet for Team identified  Problems, Interventions, and Goals.    Signed by: Mariel Kansky, RN 10/15/2019 11:20:00 PM

## 2019-10-15 NOTE — Rehab Progress Note ([facility]) (Signed)
Michael White  MRN: [id number]  Account: [id number]  Session Start: 10/15/2019 2:00:00 PM  Session Stop: 10/15/2019 3:00:00 PM    Total Treatment Minutes: 60.00 Minutes    Speech Language Pathology  Inpatient Rehabilitation Treatment Note    Rehab Diagnosis: revealed stable R thalamus hemorrhagic infarct.  Demographics:            Age: 54Y            Gender: Male  Rehabilitation Precautions/Restrictions:   Risk for falls    Interventions:       Speech Treatment: SLP intervention targeted attention, processing and  orientation.       Speech Treatment: Patient assisted out of bed, transferred to wheelchair  provided CGA / minimal physical assistance. Notable positive change regarding  sequencing cueing and task initiation as compared to initial evaluation. He is  responding well to visual placement cueing during transfers and minimal tactile  prompt during stand pivot turn. SLP suggested to call his daughter or son, as he  was increasingly perseverating on visiting hours and the location of his kids.  SLP provided written cues (phone number / call out #), in order for pt to  successfully manage the telephone. Although processing and motor initiation are  slowly improving, patient demonstrated significant confusion, altered mental  status and disorientation today. He perseverated on many abstract concepts, and  verbalized feelings of being lost, being watched constantly, and many moments of  d?j? vu. SLP provided patient with May calendar, wrote in concrete events from  recent timeline, ie day of hospital admission, rehab admission. With max guiding  prompts he acknowledged relevant timeline dates, although did not reduce any  confusion.    Patient was left seated in chair at nurse's station.  Patient was handed off to next therapist. Handoff to nurse completed.  Chair alarm in place and activated.  Personal items within reach.    ASSESSMENT  Significant decline re: orientation and self awareness this afternoon. CT  head  completed prior to ST session, pending results at this time. SLP communicated  new onset of patient?s altered mental status with RN Tania, PT and OT.    3 Hour Rule Minutes: 60 minutes of SLP treatment this session count towards  intensity and duration of therapy requirement. Patient was seen for the full  scheduled time of SLP treatment this session.  Therapy Mode Minutes: Individual: 60 minutes.    Signed by: Jamison Neighbor, M.A., CCC/SLP 10/15/2019 3:00:00 PM

## 2019-10-15 NOTE — Progress Notes (Signed)
PHYSICAL MEDICINE AND REHABILITATION  PROGRESS NOTE  FACE TO Bruce Donath    Date Time: 10/15/19 5:12 PM    Patient Name: Michael White, Michael SR.  Admission date:  10/08/2019  (LOS: 7 days)    Subjective:     Patient seen this afternoon  Case discussed with therapy team as well as RN, functionally has been doing the same but cognition much worse today, head CT ordered which showed no new abnormalities  Will check labs in am, discontinue trazodone and flexeril which can cause confusion   Urine done last night does not show UTI  Completed keppra course - monitor for seizures     Functional Status:     Interventions:       Speech Treatment: SLP intervention targeted attention, processing and  orientation.       Speech Treatment: Patient assisted out of bed, transferred to wheelchair  provided CGA / minimal physical assistance. Notable positive change regarding  sequencing cueing and task initiation as compared to initial evaluation. He is  responding well to visual placement cueing during transfers and minimal tactile  prompt during stand pivot turn. SLP suggested to call his daughter or son, as he  was increasingly perseverating on visiting hours and the location of his kids.  SLP provided written cues (phone number / call out #), in order for pt to  successfully manage the telephone. Although processing and motor initiation are  slowly improving, patient demonstrated significant confusion, altered mental  status and disorientation today. He perseverated on many abstract concepts, and  verbalized feelings of being lost, being watched constantly, and many moments of  d?j? vu. SLP provided patient with May calendar, wrote in concrete events from  recent timeline, ie day of hospital admission, rehab admission. With max guiding  prompts he acknowledged relevant timeline dates, although did not reduce any  confusion.    Medications:   Medication reviewed by me:     Scheduled Meds: PRN Meds:    amLODIPine, 5 mg, Oral,  Daily  hydrALAZINE, 50 mg, Oral, Q8H  senna-docusate, 2 tablet, Oral, Daily  tamsulosin, 0.4 mg, Oral, Daily after dinner        Continuous Infusions:   acetaminophen, 650 mg, Q6H PRN  bisacodyl, 10 mg, Daily PRN  lactulose, 20 g, Q4H PRN  lidocaine, , PRN  ondansetron, 4 mg, Q8H PRN          Medication Review  1. A complete drug regimen review was completed: Yes  2. Were drug issues were found during review: No   If yes to drug issues found during review:   What was the issue?:    What was the time the issue was identified?:    Was I contacted and action was taken by midnight of the next calendar day once issue was identified?: N/A    Person who contacted me:    Action taken:     Review of Systems:   A comprehensive review of systems was: No fevers, chills, nausea, vomiting, chest pain, shortness of breath, cough, headache, double vision.  All others negative.    Labs:     Recent Labs   Lab 10/14/19  0503 10/11/19  0601 10/09/19  0534   WBC 6.90 7.25 6.30   Hgb 13.1 13.6 13.0   Hematocrit 41.8 44.0 41.8   Platelets 432* 371* 327      Recent Labs   Lab 10/11/19  0601 10/08/19  1919   Sodium 136 135*   Potassium  4.6 4.0   Chloride 100 101   CO2 25 25   BUN 13 14   Creatinine 0.9 0.9   Calcium 9.2 9.0   Albumin  --  3.7   Protein, Total  --  6.8   Bilirubin, Total  --  0.8   Alkaline Phosphatase  --  82   ALT  --  14   AST (SGOT)  --  11   Glucose 93 119*         Rads:   Radiological Procedure reviewed.  Radiology Results (24 Hour)     Procedure Component Value Units Date/Time    CT Head WO Contrast [[id number]] Collected: 10/15/19 1358    Order Status: Completed Updated: 10/15/19 1402    Narrative:      HISTORY: Follow-up intracranial hematoma    TECHNIQUE:  Non enhanced Computed tomography the head was performed at 5  mm slice thickness. A combination of automatic exposure control,  adjustment of the mA a and/or KVP according to the patient's size and or  use of iterative reconstruction technique was  utilized.    PRIORS: 10/04/2019.    FINDINGS:   There is essentially complete resolution of right thalamic and  intraventricular hemorrhage.    There is diffuse atrophy with small vessels ischemic changes. There is  chronic lacunar infarct involving the left thalamus. The ventricular  system is normal in size, shape and contour. There is no midline shift  or herniation. There are no extra-axial fluid collections.   The visualized portions of paranasal sinuses are within normal limits.  The bony structures are unremarkable.      Impression:       Essentially, complete resolution of intracranial  hemorrhages.    Georgana Curio, MD   10/15/2019 2:00 PM        Physical Exam:     Vitals:    10/15/19 0527 10/15/19 0841 10/15/19 1500 10/15/19 1551   BP: 126/76 138/82 127/79 124/82   Pulse: 90   (!) 102   Resp: 16      Temp: 98.1 F (36.7 C)      TempSrc: Oral      SpO2: 98%      Weight:       Height:           Intake and Output Summary (Last 24 hours) at Date Time    Intake/Output Summary (Last 24 hours) at 10/15/2019 1712  Last data filed at 10/15/2019 1338  Gross per 24 hour   Intake 1060 ml   Output 575 ml   Net 485 ml     P.O.: 240 mL (10/15/19 1338)     Urine: 200 mL (10/15/19 1338)  Bladder Scan Volume (mL): 100 mL (10/15/19 0527)  Intermittent/Straight Cath (mL): (Patient refused) (10/11/19 0537)       Cardiac: regular rhythm  Chest / Lungs: non labored breathing   Abdomen: soft nontender   Extremities: no calf tenderness.  Neuro: confusion     Assessment and Plan:     The following medical/functional conditions and comorbidities may be complicating factors in their comprehensive rehabilitation program.    #Right thalamic hemorrhage in the setting of hypertensive emergency with deficits in mobility and ADLs: OOB, fall precautions  Keppra for seizure prophylaxis x7days - completed     #Pain Management: Tylenol PRN    #DVT Prophylaxis: SCDs. No pharmacologics due to intracranial bleed    #Bowel/Bladder Management:  PVRs to rule out urinary retention. Use Senna and  Colace    #Muscle cramps: Patient states these are chronic for >12 years which he attributes to constantly moving heavy furniture  Flexeril 5 mg - discontinue due to increased confusion   Follow up CK levels    #Skin Care: Keep skin clean and dry    #Comorbities (HTN): Continue Norvasc, Hydralazine    #Psych (hx tobacco dependence-Quit Sep 13, 2019): Psychology services for evaluation and treatment as needed for adjustment/coping   Endorses a lot of stressors including financial and family given his wife has dementia and he is the bread winner.    #FEN/GI (Dysphagia): Mechanical soft diet, SLP to follow    #Dispo: TBD pending rehab progress      Continue comprehensive and intensive inpatient rehab program, including:   Physical therapy 60-120 min daily, 5-6 times per week, Occupational therapy 60-120 min daily, 5-6 times per week, Case management and Rehabilitation nursing.  Psychology services to evaluate and treat as needed     Signed by: Cheryll Dessert MD  10/15/2019, 5:12 PM    [facility]

## 2019-10-15 NOTE — Rehab Progress Note (Medilinks) (Signed)
Michael White  MRN: [id number]  Account: [id number]  Session Start: 10/15/2019 12:00:00 AM  Session Stop: 10/15/2019 12:00:00 AM    Total Treatment Minutes:  Minutes    Rehabilitation Nursing  Inpatient Rehabilitation Shift Assessment    Rehab Diagnosis: revealed stable R thalamus hemorrhagic infarct.  Demographics:            Age: 64Y            Gender: Male  Primary Language: English    Date of Onset:  10/04/19  Date of Admission: 10/08/2019 4:57:00 PM    Rehabilitation Precautions Restrictions:   Risk of aspiration/swallowing. Mechanical soft  Risk for falls    Patient Report: "I am just a little confused."  Patient/Caregiver Goals:  " I want to get better and walk safely again."    Wounds/Incisions: No wounds or incisions.    Medication Review: No clinically significant medication issues identified this  shift.    Bowel and Bladder Output:                       Bladder (# only)             Bowel (# only)  Number of Episodes  Continent            3                            1  Incontinent          0                            0    Additional Education Provided:    Education Provided: Precautions. Pain management. Pain scale. Medication  options. Plan of care. Safety. Medication. Name and dosage. Purpose. Stroke.  Signs /T/ Symptoms associated with stroke       Audience: Patient.       Mode: Explanation.       Response: Needs reinforcement.    TEAM CARE PLAN  Identified problems from team documentation:  Problem: Impaired Cardiac Function  Cardiac: Primary Team Goal: Patient will display vital signs within  limits./Active  Cardiac: Additional Team Goal: Patient and caregiver will teack-back  BEFAST./Active    Problem: Impaired Cognition  Cognition: Primary Team Goal: Patient will demonstrate adequate attention and  visual / verbal processing to complete ADLs and mild / moderately complex  cognitive tasks for periods of 20 minutes provided min redirections./Active    Problem: Impaired Mobility  Mobility: Primary  Team Goal: Pt will perform majority of mobility at a  supervision level with most appropriate assistive device for increased safety  and indepednence upon discharge./Active    Problem: Impaired Psychosocial Skills/Behavior  PsychoSocial: Primary Team Goal: Increase adjustment to changes post-stroke and  his next phase of life/    Problem: Impaired Self-care Mgmt/ADL/IADL  Self Care: Primary Team Goal: Pt will complete self carea at a superivsio/CGA  level provided by family.care giver in order to return home safely./Active    Problem: Impaired Swallowing  Swallowing: Primary Team Goal: Patient will incorporate swallow strategies to  maintain adequate mastication and functional oral clearance in order to safely  tolerate regular texture/thin liquid diet in 100% opportunities mod I./Active    Problem: Safety Risk and Restraint  Safety: Primary Team Goal: Patient will display learned safety  precautions./Active    Please review Integrated Patient View Care  Plan Flowsheet for Team identified  Problems, Interventions, and Goals.    Signed by: Norris Cross, RN 10/15/2019 6:05:00 PM

## 2019-10-15 NOTE — Rehab Progress Note (Medilinks) (Signed)
Michael White  MRN: [id number]  Account: [id number]  Session Start: 10/15/2019 3:00:00 PM  Session Stop: 10/15/2019 4:00:00 PM    Total Treatment Minutes: 60.00 Minutes    Physical Therapy  Inpatient Rehabilitation Treatment Note    Rehab Diagnosis: revealed stable R thalamus hemorrhagic infarct.  Demographics:            Age: 47Y            Gender: Male  Rehabilitation Precautions/Restrictions:   Fall risk  Impulsive, has telesitter    OBJECTIVE  Vital Signs:                       Before Activity              After Activity  Vitals  BP Systolic          127                          124  BP Diastolic         79                           82  Pulse                -                            102    Interventions:       Therapeutic Activities:  Pt sitting up in w/c at nurse's station,  appreciate hand off from RN and SLP.Marland Kitchen  He practices gait training 200' with SBA  using RW, and several trials of approx 150-200' with CG/Min A without AD.  During session pt reports urgent need to urinate, and is able to ambulate into  the bathroom with SBA and RW but does need moderate assist with managing  clothing before and after urinating a small amount.  Increased time to wash  hands while standing at sink.  Pt also negotiates 4 (6") stairs with CGA using B  rails.  Patient was left seated in chair at nurse's station. Handoff to nurse completed.  Chair alarm in place and activated.  Personal items within reach.    ASSESSMENT  While pt does have some episodes of confusion today, he also continues to have  improved processing and more awareness of his functional impairments and current  situation.    3 Hour Rule Minutes: 60 minutes of PT treatment this session count towards  intensity and duration of therapy requirement. Patient was seen for the full  scheduled time of PT treatment this session.  Therapy Mode Minutes: Individual: 60 minutes.    Signed by: Jaquita Rector, PT 10/15/2019 4:00:00 PM

## 2019-10-15 NOTE — Rehab Progress Note (Medilinks) (Signed)
NAMEJERRELLE White  MRN: [id number]  Account: [id number]  Session Start: 10/14/2019 12:00:00 AM  Session Stop: 10/14/2019 12:00:00 AM    Total Treatment Minutes:  Minutes    Rehabilitation Nursing  Inpatient Rehabilitation Shift Assessment    Rehab Diagnosis: revealed stable R thalamus hemorrhagic infarct.  Demographics:            Age: 43Y            Gender: Male  Primary Language: English    Date of Onset:  10/04/19  Date of Admission: 10/08/2019 4:57:00 PM    Rehabilitation Precautions Restrictions:   Risk of aspiration/swallowing. Mechanical soft  Risk for falls    Patient Report: " I am doing okay".  Patient/Caregiver Goals:  " I want to get better and walk safely again."    Wounds/Incisions: No wounds or incisions.    Medication Review: No clinically significant medication issues identified this  shift.    Bowel and Bladder Output:                       Bladder (# only)             Bowel (# only)  Number of Episodes  Continent            3                            0  Incontinent          0                            0    Additional Education Provided:    Education Provided: Pain management. Pain scale. Medication options. Side  effects. Clinical indicators of pain. Plan of care. Safety issues and  interventions. Fall protocol. Supervision requirements. Skin/wound care.  Critical pressure areas. Prevention of skin breakdown. Activities of daily  living. Bed mobility. Functional transfers. Fall prevention/balance training.  Financial planner. Wheelchair mobility. Safety. Stroke. What is stroke,  Types of stroke, Signs /T/ Symptoms associated with stroke, Stroke prevention,  Medication compliance, Control blood pressure, high cholesterol and/or diabetes,  Smoking cessation, Healthy diet, Increase physical activity, Reduce stress,  Limit alcohol intake, Stroke recovery, Emotional adjustments/depression,  Thrombophlebitis (DVT), Shoulder care/pain prevention, Pressure ulcer, Pulmonary  complications, Prevention of  contractures/subluxation, Tone/ROM/spasticity,  Caregiver stress, Supervision/assistance requirements Cardiac. Problem solving  (signs and symptoms). Nutrition and lifestyle changes. Monitoring heart rate.  Monitoring blood pressure. Medication. Name and dosage. Administration. Purpose.  Side Effects. Interaction. Labs.       Audience: Patient.       Mode: Explanation.  Demonstration.  Teacher, English as a foreign language provided.  Stroke ed booklet.       Response: Applied knowledge.  Verbalized understanding.  Needs reinforcement.    TEAM CARE PLAN  Identified problems from team documentation:  Problem: Impaired Cardiac Function  Cardiac: Primary Team Goal: Patient will display vital signs within  limits./Active  Cardiac: Additional Team Goal: Patient and caregiver will teack-back  BEFAST./Active    Problem: Impaired Cognition  Cognition: Primary Team Goal: Patient will demonstrate adequate attention and  visual / verbal processing to complete ADLs and mild / moderately complex  cognitive tasks for periods of 20 minutes provided min redirections./Active    Problem: Impaired Mobility  Mobility: Primary Team Goal: Pt will perform majority of mobility at a  supervision level with most appropriate  assistive device for increased safety  and indepednence upon discharge./Active    Problem: Impaired Self-care Mgmt/ADL/IADL  Self Care: Primary Team Goal: Pt will complete self carea at a superivsio/CGA  level provided by family.care giver in order to return home safely./Active    Problem: Impaired Swallowing  Swallowing: Primary Team Goal: Patient will incorporate swallow strategies to  maintain adequate mastication and functional oral clearance in order to safely  tolerate regular texture/thin liquid diet in 100% opportunities mod I./Active    Problem: Safety Risk and Restraint  Safety: Primary Team Goal: Patient will display learned safety  precautions./Active    Please review Integrated Patient View Care Plan Flowsheet for Team  identified  Problems, Interventions, and Goals.    Signed by: Candida Peeling, RN 10/14/2019 10:31:00 PM

## 2019-10-16 LAB — CBC AND DIFFERENTIAL
Absolute NRBC: 0 10*3/uL (ref 0.00–0.00)
Basophils Absolute Automated: 0.06 10*3/uL (ref 0.00–0.08)
Basophils Automated: 0.8 %
Eosinophils Absolute Automated: 0.16 10*3/uL (ref 0.00–0.44)
Eosinophils Automated: 2.2 %
Hematocrit: 41.2 % (ref 37.6–49.6)
Hgb: 12.9 g/dL (ref 12.5–17.1)
Immature Granulocytes Absolute: 0.02 10*3/uL (ref 0.00–0.07)
Immature Granulocytes: 0.3 %
Lymphocytes Absolute Automated: 2.42 10*3/uL (ref 0.42–3.22)
Lymphocytes Automated: 32.8 %
MCH: 21.1 pg — ABNORMAL LOW (ref 25.1–33.5)
MCHC: 31.3 g/dL — ABNORMAL LOW (ref 31.5–35.8)
MCV: 67.3 fL — ABNORMAL LOW (ref 78.0–96.0)
MPV: 8.5 fL — ABNORMAL LOW (ref 8.9–12.5)
Monocytes Absolute Automated: 0.91 10*3/uL — ABNORMAL HIGH (ref 0.21–0.85)
Monocytes: 12.3 %
Neutrophils Absolute: 3.8 10*3/uL (ref 1.10–6.33)
Neutrophils: 51.6 %
Nucleated RBC: 0 /100 WBC (ref 0.0–0.0)
Platelets: 442 10*3/uL — ABNORMAL HIGH (ref 142–346)
RBC: 6.12 10*6/uL — ABNORMAL HIGH (ref 4.20–5.90)
RDW: 22 % — ABNORMAL HIGH (ref 11–15)
WBC: 7.37 10*3/uL (ref 3.10–9.50)

## 2019-10-16 LAB — HEMOLYSIS INDEX: Hemolysis Index: 11 (ref 0–18)

## 2019-10-16 LAB — COMPREHENSIVE METABOLIC PANEL
ALT: 27 U/L (ref 0–55)
AST (SGOT): 20 U/L (ref 5–34)
Albumin/Globulin Ratio: 1.1 (ref 0.9–2.2)
Albumin: 4 g/dL (ref 3.5–5.0)
Alkaline Phosphatase: 84 U/L (ref 38–106)
Anion Gap: 12 (ref 5.0–15.0)
BUN: 19 mg/dL (ref 9–28)
Bilirubin, Total: 0.6 mg/dL (ref 0.2–1.2)
CO2: 26 mEq/L (ref 22–29)
Calcium: 9.9 mg/dL (ref 8.5–10.5)
Chloride: 99 mEq/L — ABNORMAL LOW (ref 100–111)
Creatinine: 0.9 mg/dL (ref 0.7–1.3)
Globulin: 3.6 g/dL (ref 2.0–3.6)
Glucose: 84 mg/dL (ref 70–100)
Potassium: 4.2 mEq/L (ref 3.5–5.1)
Protein, Total: 7.6 g/dL (ref 6.0–8.3)
Sodium: 137 mEq/L (ref 136–145)

## 2019-10-16 LAB — GFR: EGFR: >60

## 2019-10-16 LAB — AMMONIA: Ammonia: 33 umol/L (ref 18–72)

## 2019-10-16 LAB — CK: Creatine Kinase (CK): 87 U/L (ref 47–267)

## 2019-10-16 LAB — VITAMIN B12: Vitamin B-12: 518 pg/mL (ref 211–911)

## 2019-10-16 LAB — VITAMIN D,25 OH,TOTAL: Vitamin D, 25 OH, Total: 29 ng/mL — ABNORMAL LOW (ref 30–100)

## 2019-10-16 LAB — FOLATE: Folate: 10.9 ng/mL

## 2019-10-16 MED ORDER — QUETIAPINE FUMARATE 25 MG PO TABS
25.0000 mg | ORAL_TABLET | Freq: Every evening | ORAL | Status: DC | PRN
Start: 2019-10-16 — End: 2019-10-18
  Administered 2019-10-16 – 2019-10-17 (×2): 25 mg via ORAL
  Filled 2019-10-16 (×2): qty 1

## 2019-10-16 MED ORDER — QUETIAPINE FUMARATE 25 MG PO TABS
12.5000 mg | ORAL_TABLET | Freq: Four times a day (QID) | ORAL | Status: DC | PRN
Start: 2019-10-16 — End: 2019-10-18
  Administered 2019-10-16: 14:00:00 12.5 mg via ORAL
  Filled 2019-10-16: qty 1

## 2019-10-16 NOTE — Progress Notes (Signed)
PROGRESS NOTE -- FACE-TO-FACE ENCOUNTER  PHYSICAL MEDICINE AND REHABILITATION      Date Time: 10/16/19 2:50 PM  Patient Name: Michael White, Michael SR.    Admission date:  10/08/2019      Plan:   Continue PT, OT in acute rehabilitation medical setting.  Continue comprehensive and intensive inpatient rehab program, including:   Physical therapy 60-120 min daily, 5-6 times per week, Occupational therapy 60-120 min daily, 5-6 times per week, Case management and Rehabilitation nursing    We will try Seroquel 12.5 mg every 12 hours for hallucinations.  We will also try Seroquel 25 mg as needed for sleep/hallucinations at night.    Monitor for seizures.    Assessment:   Continue with acute inpatient rehabilitation with close medical supervision for Hemorrhagic stroke.     Patient Active Problem List   Diagnosis    Essential hypertension    Nicotine abuse    Partial tear of right rotator cuff    H/O cardiac radiofrequency ablation    Premature atrial contractions    Premature ventricular contractions    Acute CVA (cerebrovascular accident)    Acute right thalamic hemorrhagic infarct    Hypertensive urgency    Acute metabolic encephalopathy    Cerebrovascular accident (CVA) of right thalamus       Subjective:   Patient is complaining of hallucinations.  Did not sleep well according to nursing.  Rounded with nursing.    Medications:   Medications reviewed by me:     Scheduled Meds: PRN Meds:    amLODIPine, 5 mg, Oral, Daily  hydrALAZINE, 50 mg, Oral, Q8H  senna-docusate, 2 tablet, Oral, Daily  tamsulosin, 0.4 mg, Oral, Daily after dinner        Continuous Infusions:   acetaminophen, 650 mg, Q6H PRN  bisacodyl, 10 mg, Daily PRN  lactulose, 20 g, Q4H PRN  lidocaine, , PRN  ondansetron, 4 mg, Q8H PRN  QUEtiapine, 12.5 mg, Q6H PRN  QUEtiapine, 25 mg, QHS PRN          Medication Review  1. A complete drug regimen review was completed: Yes  2. Were any drug issues found during review?: No   If yes    Was I contacted and action  was taken by midnight of the next calendar day once issue was identified?: N/A    Person who contacted me: N/A   What was the issue?: N/A   Action taken: N/A   What was the time the issue was identified?: N/A    Review of Systems:   No new issues overnight.  No new pain.    Physical Exam:     Vitals:    10/16/19 0538 10/16/19 0852 10/16/19 0902 10/16/19 1421   BP: 135/73 153/81 153/81 163/87   Pulse: 82  98 99   Resp: 17  18    Temp: 98.6 F (37 C)  97.7 F (36.5 C)    TempSrc: Oral  Oral    SpO2: 99%  99%    Weight:       Height:           Intake and Output Summary (Last 24 hours) at Date Time    Intake/Output Summary (Last 24 hours) at 10/16/2019 1450  Last data filed at 10/16/2019 1410  Gross per 24 hour   Intake 680 ml   Output 1675 ml   Net -995 ml     P.O.: 240 mL (10/16/19 1410)     Urine: 75 mL (10/16/19  1214)  Bladder Scan Volume (mL): 195 mL (10/16/19 1005)  Intermittent/Straight Cath (mL): (Patient refused) (10/11/19 0537)       Chest:  symmetric movement of chest  NAD.    Labs:   No results for input(s): GLUCOSEWHOLE in the last 24 hours.    Recent Labs   Lab 10/16/19  0654 10/14/19  0503 10/11/19  0601   WBC 7.37 6.90 7.25   Hgb 12.9 13.1 13.6   Hematocrit 41.2 41.8 44.0   Platelets 442* 432* 371*        Recent Labs   Lab 10/16/19  0654 10/11/19  0601   Sodium 137 136   Potassium 4.2 4.6   Chloride 99* 100   CO2 26 25   BUN 19 13   Creatinine 0.9 0.9   Calcium 9.9 9.2   Albumin 4.0  --    Protein, Total 7.6  --    Bilirubin, Total 0.6  --    Alkaline Phosphatase 84  --    ALT 27  --    AST (SGOT) 20  --    Glucose 84 93             Results     Procedure Component Value Units Date/Time    Hemolysis index [[id number]] Collected: 10/16/19 0654     Updated: 10/16/19 1427     Hemolysis Index 11    Narrative:      Rescheduled by [id number] at 10/16/2019 04:55 Reason: Patient Declined Blood   Draw    Vitamin B12 [[id number]] Collected: 10/16/19 0654    Specimen: Blood Updated: 10/16/19 1423    Narrative:       Rescheduled by [id number] at 10/16/2019 04:55 Reason: Patient Declined Blood   Draw  Rescheduled by [id number] at 10/16/2019 04:55 Reason: Patient Declined Blood   Draw    Vitamin D,25 OH, Total [[id number]] Collected: 10/16/19 0654    Specimen: Blood Updated: 10/16/19 1423    Narrative:      Rescheduled by [id number] at 10/16/2019 04:55 Reason: Patient Declined Blood   Draw  Rescheduled by [id number] at 10/16/2019 04:55 Reason: Patient Declined Blood   Draw    Folate [[id number]] Collected: 10/16/19 0654    Specimen: Blood Updated: 10/16/19 1423    Narrative:      Rescheduled by [id number] at 10/16/2019 04:55 Reason: Patient Declined Blood   Draw  Rescheduled by [id number] at 10/16/2019 04:55 Reason: Patient Declined Blood   Draw    GFR [[id number]] Collected: 10/16/19 0654     Updated: 10/16/19 0917     EGFR >60.0    Narrative:      Rescheduled by [id number] at 10/16/2019 04:55 Reason: Patient Declined Blood   Draw    Comprehensive metabolic panel [[id number]]  (Abnormal) Collected: 10/16/19 0654    Specimen: Blood Updated: 10/16/19 0917     Glucose 84 mg/dL      BUN 19 mg/dL      Creatinine 0.9 mg/dL      Sodium 643 mEq/L      Potassium 4.2 mEq/L      Chloride 99 mEq/L      CO2 26 mEq/L      Calcium 9.9 mg/dL      Protein, Total 7.6 g/dL      Albumin 4.0 g/dL      AST (SGOT) 20 U/L      ALT 27 U/L      Alkaline Phosphatase 84 U/L      Bilirubin, Total 0.6 mg/dL  Globulin 3.6 g/dL      Albumin/Globulin Ratio 1.1     Anion Gap 12.0    Narrative:      Rescheduled by [id number] at 10/16/2019 04:55 Reason: Patient Declined Blood   Draw    Creatine Kinase (CK) [[id number]] Collected: 10/16/19 0654    Specimen: Blood Updated: 10/16/19 0917     Creatine Kinase (CK) 87 U/L     Narrative:      Rescheduled by [id number] at 10/16/2019 04:55 Reason: Patient Declined Blood   Draw    Ammonia [[id number]] Collected: 10/16/19 0654    Specimen: Blood Updated: 10/16/19 0715     Ammonia 33 umol/L     Narrative:      Rescheduled by [id number] at 10/16/2019 04:55 Reason: Patient  Declined Blood   Draw    CBC and differential [[id number]]  (Abnormal) Collected: 10/16/19 0654    Specimen: Blood Updated: 10/16/19 0708     WBC 7.37 x10 3/uL      Hgb 12.9 g/dL      Hematocrit 31.5 %      Platelets 442 x10 3/uL      RBC 6.12 x10 6/uL      MCV 67.3 fL      MCH 21.1 pg      MCHC 31.3 g/dL      RDW 22 %      MPV 8.5 fL      Neutrophils 51.6 %      Lymphocytes Automated 32.8 %      Monocytes 12.3 %      Eosinophils Automated 2.2 %      Basophils Automated 0.8 %      Immature Granulocytes 0.3 %      Nucleated RBC 0.0 /100 WBC      Neutrophils Absolute 3.80 x10 3/uL      Lymphocytes Absolute Automated 2.42 x10 3/uL      Monocytes Absolute Automated 0.91 x10 3/uL      Eosinophils Absolute Automated 0.16 x10 3/uL      Basophils Absolute Automated 0.06 x10 3/uL      Immature Granulocytes Absolute 0.02 x10 3/uL      Absolute NRBC 0.00 x10 3/uL     Narrative:      Rescheduled by [id number] at 10/16/2019 04:55 Reason: Patient Declined Blood   Draw             Rads:   Radiological Procedure reviewed.  Radiology Results (24 Hour)     ** No results found for the last 24 hours. **          Signed by: Virl Cagey MD    [facility]      If there are questions or concerns about the content of this note or information contained within the body of this dictation they should be addressed directly with the author for clarification.

## 2019-10-16 NOTE — Rehab Progress Note ([facility]) (Signed)
Michael White  MRN: [id number]  Account: [id number]  Session Start: 10/16/2019 12:00:00 AM  Session Stop: 10/16/2019 12:00:00 AM    Total Treatment Minutes:  Minutes    Rehabilitation Nursing  Inpatient Rehabilitation Shift Assessment    Rehab Diagnosis: revealed stable R thalamus hemorrhagic infarct.  Demographics:            Age: 39Y            Gender: Male  Primary Language: English    Date of Onset:  10/04/19  Date of Admission: 10/08/2019 4:57:00 PM    Rehabilitation Precautions Restrictions:   Risk of aspiration/swallowing. Mechanical soft  Risk for falls    Patient Report: " I am fine "  Patient/Caregiver Goals:  " I want to get better and walk safely again."    Wounds/Incisions: No wounds or incisions.    Medication Review: No clinically significant medication issues identified this  shift.    Bowel and Bladder Output:                       Bladder (# only)             Bowel (# only)  Number of Episodes  Continent            5                            0  Incontinent          0                            0    Additional Education Provided:    Education Provided: Safety.       Audience: Patient.       Mode: Explanation.       Response: Verbalized understanding.    TEAM CARE PLAN  Identified problems from team documentation:  Problem: Impaired Cardiac Function  Cardiac: Primary Team Goal: Patient will display vital signs within  limits./Active  Cardiac: Additional Team Goal: Patient and caregiver will teack-back  BEFAST./Active    Problem: Impaired Cognition  Cognition: Primary Team Goal: Patient will demonstrate adequate attention and  visual / verbal processing to complete ADLs and mild / moderately complex  cognitive tasks for periods of 20 minutes provided min redirections./Active    Problem: Impaired Mobility  Mobility: Primary Team Goal: Pt will perform majority of mobility at a  supervision level with most appropriate assistive device for increased safety  and indepednence upon  discharge./Active    Problem: Impaired Psychosocial Skills/Behavior  PsychoSocial: Primary Team Goal: Increase adjustment to changes post-stroke and  his next phase of life/    Problem: Impaired Self-care Mgmt/ADL/IADL  Self Care: Primary Team Goal: Pt will complete self carea at a superivsio/CGA  level provided by family.care giver in order to return home safely./Active    Problem: Impaired Swallowing  Swallowing: Primary Team Goal: Patient will incorporate swallow strategies to  maintain adequate mastication and functional oral clearance in order to safely  tolerate regular texture/thin liquid diet in 100% opportunities mod I./Active    Problem: Safety Risk and Restraint  Safety: Primary Team Goal: Patient will display learned safety  precautions./Active    Please review Integrated Patient View Care Plan Flowsheet for Team identified  Problems, Interventions, and Goals.    Signed by: Zorita Pang, RN 10/16/2019 6:45:00 PM

## 2019-10-16 NOTE — Progress Notes (Signed)
Patient refused blood draws this morning.

## 2019-10-16 NOTE — Rehab Progress Note ([facility]) (Signed)
Michael White  MRN: [id number]  Account: [id number]  Session Start: 10/15/2019 11:00:00 AM  Session Stop: 10/15/2019 12:00:00 PM    Total Treatment Minutes: 60.00 Minutes    Occupational Therapy  Inpatient Rehabilitation Progress Note    Rehab Diagnosis: revealed stable R thalamus hemorrhagic infarct.  Demographics:            Age: 62Y            Gender: Male  Rehabilitation Precautions/Restrictions:   Risk for falls    SUBJECTIVE  Pain: Patient currently without complaints of pain.    OBJECTIVE  Vital Signs:                       Before Activity              After Activity  Vitals  BP Systolic          -                            125  BP Diastolic         -                            80  Pulse                -                            98    CARE Tool  Self-Care Functional Assessment  Eating: 05 - Setup or Clean Up Assistance: Helper sets up or cleans up prior to  or following an activity  Assistive Device(s):  None   Poor processing impacting ability to prepare items (open containers, cut food).      Oral Hygiene: 03 - Partial/Moderate Assistance: Helper does 1-25% of the  activity  Assistive Device(s):   None  Context for oral hygiene:   Standing   Assist for balance standing at sink. Extra time.    Toileting Hygiene: 03 - Partial/Moderate Assistance: Helper does 26-50% of the  activity  Toileting Equipment:   Toilet, Urinal  Assistive Device(s):   Grab bar   Assist for balance when standing to void and manage clothing.  Assist for peri  hygiene after BM.    Shower/Bathe Self: 03 - Partial/Moderate Assistance: Helper does 26-50% of the  activity  Location:  Shower.  Assistive Device(s):   Hand held shower,  shower seat.   Assist for thoroughness with washing feet. Significant extra time due to  decreased processing and memory (patient washes multiple body parts multiple  times). Assist to wash buttocks.    Upper Body Dressing:   10 - Not attempted due to environmental limitations.   Patient does not have UB  clothing available. Wears gown only.    Lower Body Dressing: 03 - Partial/Moderate Assistance: Helper does 1-25% of the  activity  Assistive Device(s):   None   Patient threads pants/underwear, but requires significant extra time and min A  for balance when standing to hike over hips.    Putting On/Taking Off Footwear: 01 - Dependent: Helper does 100% of the activity    Assistive Device(s):   None    Toilet Transfer: 03 - Partial/Moderate Assistance: Helper does 26-50% of the  activity  Assistive Device(s):   Grab bars    Picking  Up Objects: 62 - Not attempted due to medical or safety concerns.    Interventions:       Self Care/Home Management:  Patient participates in OT session with focus  on morning routine including showering, toileting, and dressing. Patient  completes these as described in CARETOOL. Of note, patient's cognition appears  impaired; more confused/disoriented with decreased memory.  Patient was left seated in chair at nurse's station. Handoff to nurse completed.  Chair alarm in place and activated.  Personal items within reach.  Pain Reassessment: Pain was not reassessed as no pain was reported.  Equipment Provided: None at this time. Recommend SNF placement following this  rehab stay.    Education Provided:    Education Provided: Activities of daily living. Functional transfers. Safety.       Audience: Patient and son Tripp Goins. .       Mode: Explanation.       Response: Verbalized understanding.    ASSESSMENT  Patient appears to have cognitive decline in last two days requiring even more  time for processing and with decreased memory, and orientation.    Long Term Goals: Pt will utilize L UE at a Functional Assist level (FUEL scale)  durign ADL s in order to complete UB dressing at a  mod I level.  Pt will utilize L UE at a Functional Assist level (FUEL scale) durign ADL s in  order to complete LB bathing, dressing at a supervision level.  Pt will demonstrate improved midline orientation in  order to complete functional  transfers at supervision level w RLAD.  Pt wll complete toileting including trnasfers at a supervision level w RLAD>  Pt's family/care giver will demonstrate ability to provide recommended  supervision assistance and safe environemnt in order to return home.  2-3 weeks from eval 10/09/19  Short Term Goals: Pt will complete UB dressing, bathing w min  A  in a timely  manner and mod external structure .  Pt will complete LB bathing, dressing at a mod A level.  Pt will use L UE at a semi functional level ( FUEL scale) 50 % of the time  during ADL s with no more han 2 vc s.  Pt wll consistently complete functional transfers at min A level w LRAD.  1 week from evaluation 10/09/19    Progress Towards Goals: SHORT TERM GOAL REVIEW:       1. Pt will complete UB dressing, bathing w min  A  in a timely manner and  mod external structure . - Not Met Unable to rate as patient wears hospital  gowns.       2. Pt will complete LB bathing, dressing at a mod A level. - Met       3. Pt will use L UE at a semi functional level ( FUEL scale) 50 % of the  time during ADL s with no more han 2 vc s. - Met       4. Pt wll consistently complete functional transfers at min A level w LRAD.  - Not Met: min to mod A for balance and midline orientation.  Time frame to achieve short term goal(s):  1 week from evaluation 10/09/19   LONG TERM GOAL REVIEW:       1. Pt will utilize L UE at a Functional Assist level (FUEL scale) durign  ADL s in order to complete UB dressing at a  mod I level. - Not Met: LUE is at  Functional Assist level, however unable to rate UB dressing.       2. Pt will utilize L UE at a Functional Assist level (FUEL scale) durign  ADL s in order to complete LB bathing, dressing at a supervision level. - Not  Met Assist required for thoroughness of bathing, and for balance during LB  dressing.       New Goal: Pt will complete LBdressing at a min A level.       3. Pt will demonstrate improved midline  orientation in order to complete  functional transfers at supervision level w RLAD. - Not Met min to mod A for  midline orientation        New Goal: Pt will demonstrate improved midline orientation in order to  complete functional transfers at consistent min A level w LRAD.       4. Pt wll complete toileting including trnasfers at a supervision level w  RLAD> - Not Met min A required with use of grab bar from wheelchair level.       5. Pt's family/care giver will demonstrate ability to provide recommended  supervision assistance and safe environemnt in order to return home. - Not Met  Patient's son has been educated on patient's functional level and needs for  setup/assistance, however he is unable to discharge safely home at this time.       New Goal: Goal D/C  Time frame to achieve long term goal(s):  2-3 weeks from eval 10/09/19      PLAN  Continued Occupational Therapy is recommended.  Recommended Frequency/Duration/Intensity: 60-120 min per day, 5-6 times per week  1:1 treat and group PRN  Continued Activities Contributing Toward Care Plan: Recommended  Frequency/Duration/Intensity: ther act, ther ex, ADL, IADL retraining,  functional transfer, mob for ADL s, modalities, NMRE, pt and family education,  and training,  stroke education, DME, D.C planning  Activities Contributing Toward Care Plan:  Patient is appropriate for treatment in mobility group. Patient will benefit  from group therapy to practice and carry over skills learned in individual  sessions with new partners, clinicians and contexts, experience modelling by  peers    Care Plan  Identified problems from team documentation:  Problem: Impaired Cardiac Function  Cardiac: Primary Team Goal: Patient will display vital signs within  limits./Active  Cardiac: Additional Team Goal: Patient and caregiver will teack-back  BEFAST./Active    Problem: Impaired Cognition  Cognition: Primary Team Goal: Patient will demonstrate adequate attention and  visual /  verbal processing to complete ADLs and mild / moderately complex  cognitive tasks for periods of 20 minutes provided min redirections./Active    Problem: Impaired Mobility  Mobility: Primary Team Goal: Pt will perform majority of mobility at a  supervision level with most appropriate assistive device for increased safety  and indepednence upon discharge./Active    Problem: Impaired Psychosocial Skills/Behavior  PsychoSocial: Primary Team Goal: Increase adjustment to changes post-stroke and  his next phase of life/    Problem: Impaired Self-care Mgmt/ADL/IADL  Self Care: Primary Team Goal: Pt will complete self carea at a superivsio/CGA  level provided by family.care giver in order to return home safely./Active    Problem: Impaired Swallowing  Swallowing: Primary Team Goal: Patient will incorporate swallow strategies to  maintain adequate mastication and functional oral clearance in order to safely  tolerate regular texture/thin liquid diet in 100% opportunities mod I./Active    Problem: Safety Risk and Restraint  Safety: Primary Team Goal: Patient will display  learned safety  precautions./Active    Add/Update Problems from this Treatment:  Update of existing problem:   Self Care Management: Patient requires min to mod A for self care primarily  secondary to cognitive deficits and decreased midline orientation.    Discipline:  Occupational Therapy    Please review Integrated Patient View Care Plan Flowsheet for Team identified  Problems, Interventions, and Goals.    3 Hour Rule Minutes: 60 minutes of OT treatment this session count towards  intensity and duration of therapy requirement. Patient was seen for the full  scheduled time of OT treatment this session.  Therapy Mode Minutes: Individual: 60 minutes.    Signed by: Elsie Amis, OTR/L 10/15/2019 12:00:00 PM

## 2019-10-17 LAB — GLUCOSE WHOLE BLOOD - POCT
Whole Blood Glucose POCT: 150 mg/dL — ABNORMAL HIGH (ref 70–100)
Whole Blood Glucose POCT: 168 mg/dL — ABNORMAL HIGH (ref 70–100)

## 2019-10-17 MED ORDER — VITAMIN D (ERGOCALCIFEROL) 1.25 MG (50000 UT) PO CAPS
50000.00 [IU] | ORAL_CAPSULE | ORAL | Status: DC
Start: 2019-10-17 — End: 2019-10-21
  Administered 2019-10-17: 16:00:00 50000 [IU] via ORAL
  Filled 2019-10-17 (×2): qty 1

## 2019-10-17 NOTE — Progress Notes (Signed)
PROGRESS NOTE -- FACE-TO-FACE ENCOUNTER  PHYSICAL MEDICINE AND REHABILITATION      Date Time: 10/17/19 10:41 AM  Patient Name: Michael White, Michael SR.    Admission date:  10/08/2019    Plan:     Continue PT, OT in acute rehabilitation medical setting.  Continue comprehensive and intensive inpatient rehab program, including:   Physical therapy 60-120 min daily, 5-6 times per week, Occupational therapy 60-120 min daily, 5-6 times per week, Case management and Rehabilitation nursing    No new issues overnight.    Vitamin D deficiency: We will start ergocalciferol 50,000 units p.o. every Sunday.    Continue with as needed Seroquel for hallucinations.    Calm this morning on rounds    Assessment:     Medically stable to continue with acute inpatient rehab for Hemorrhagic stroke.    Patient Active Problem List   Diagnosis    Essential hypertension    Nicotine abuse    Partial tear of right rotator cuff    H/O cardiac radiofrequency ablation    Premature atrial contractions    Premature ventricular contractions    Acute CVA (cerebrovascular accident)    Acute right thalamic hemorrhagic infarct    Hypertensive urgency    Acute metabolic encephalopathy    Cerebrovascular accident (CVA) of right thalamus       Subjective:   No new pain.    Medications:   Medication reviewed by me:     Scheduled Meds: PRN Meds:    amLODIPine, 5 mg, Oral, Daily  hydrALAZINE, 50 mg, Oral, Q8H  senna-docusate, 2 tablet, Oral, Daily  tamsulosin, 0.4 mg, Oral, Daily after dinner        Continuous Infusions:   acetaminophen, 650 mg, Q6H PRN  bisacodyl, 10 mg, Daily PRN  lactulose, 20 g, Q4H PRN  lidocaine, , PRN  ondansetron, 4 mg, Q8H PRN  QUEtiapine, 12.5 mg, Q6H PRN  QUEtiapine, 25 mg, QHS PRN          Medication Review  1. A complete drug regimen review was completed: Yes  2. Were any drug issues found during review?: No   If yes    Was I contacted and action was taken by midnight of the next calendar day once issue was identified?: N/A     Person who contacted me: N/A   What was the issue?: N/A   Action taken: N/A   What was the time the issue was identified?: N/A    Review of Systems:   No new problems with pain or sleep.    Physical Exam:     Vitals:    10/16/19 1751 10/16/19 2118 10/17/19 0524 10/17/19 1006   BP: 139/77 142/80 132/86 113/73   Pulse: 85 90 96 (!) 105   Resp: 18 16 18 18    Temp: 98.2 F (36.8 C) 98.4 F (36.9 C) 98.1 F (36.7 C) 97.3 F (36.3 C)   TempSrc: Oral Oral Oral Oral   SpO2: 99% 99% 100% 98%   Weight:   63.5 kg (139 lb 15.9 oz)    Height:           Intake and Output Summary (Last 24 hours) at Date Time    Intake/Output Summary (Last 24 hours) at 10/17/2019 1041  Last data filed at 10/17/2019 0524  Gross per 24 hour   Intake 720 ml   Output 1275 ml   Net -555 ml     P.O.: 240 mL (10/16/19 2200)     Urine:  400 mL (10/17/19 0524)  Bladder Scan Volume (mL): 97 mL (10/16/19 1539)  Intermittent/Straight Cath (mL): (Patient refused) (10/11/19 0537)       NAD  Chest:  symmetric movement of chest    Labs:   No results for input(s): GLUCOSEWHOLE in the last 24 hours.    Recent Labs   Lab 10/16/19  0654 10/14/19  0503 10/11/19  0601   WBC 7.37 6.90 7.25   Hgb 12.9 13.1 13.6   Hematocrit 41.2 41.8 44.0   Platelets 442* 432* 371*        Recent Labs   Lab 10/16/19  0654 10/11/19  0601   Sodium 137 136   Potassium 4.2 4.6   Chloride 99* 100   CO2 26 25   BUN 19 13   Creatinine 0.9 0.9   Calcium 9.9 9.2   Albumin 4.0  --    Protein, Total 7.6  --    Bilirubin, Total 0.6  --    Alkaline Phosphatase 84  --    ALT 27  --    AST (SGOT) 20  --    Glucose 84 93                Recent Labs   Lab 10/16/19  0654   Creatine Kinase (CK) 87        Results     Procedure Component Value Units Date/Time    Vitamin D,25 OH, Total [[id number]]  (Abnormal) Collected: 10/16/19 0654    Specimen: Blood Updated: 10/16/19 1516     Vitamin D, 25 OH, Total 29 ng/mL     Narrative:      Rescheduled by [id number] at 10/16/2019 04:55 Reason: Patient Declined Blood    Draw    Folate [[id number]] Collected: 10/16/19 0654    Specimen: Blood Updated: 10/16/19 1516     Folate 10.9 ng/mL     Narrative:      Rescheduled by [id number] at 10/16/2019 04:55 Reason: Patient Declined Blood   Draw    Vitamin B12 [[id number]] Collected: 10/16/19 0654    Specimen: Blood Updated: 10/16/19 1516     Vitamin B-12 518 pg/mL     Narrative:      Rescheduled by [id number] at 10/16/2019 04:55 Reason: Patient Declined Blood   Draw          Rads:   Radiological Procedure reviewed.  Radiology Results (24 Hour)     ** No results found for the last 24 hours. **          Signed by: Virl Cagey MD    [facility]    If there are questions or concerns about the content of this note or information contained within the body of this dictation they should be addressed directly with the author for clarification.

## 2019-10-17 NOTE — Rehab Progress Note ([facility]) (Signed)
Michael White  MRN: [id number]  Account: [id number]  Session Start: 10/17/2019 12:00:00 AM  Session Stop: 10/17/2019 12:00:00 AM    Total Treatment Minutes:  Minutes    Rehabilitation Nursing  Inpatient Rehabilitation Shift Assessment    Rehab Diagnosis: revealed stable R thalamus hemorrhagic infarct.  Demographics:            Age: 35Y            Gender: Male  Primary Language: English    Date of Onset:  10/04/19  Date of Admission: 10/08/2019 4:57:00 PM    Rehabilitation Precautions Restrictions:   Risk of aspiration/swallowing. Mechanical soft  Risk for falls    Patient Report: "I'm doing fine"  Patient/Caregiver Goals:  " I want to get better and walk safely again."    Wounds/Incisions: No wounds or incisions.    Medication Review: No clinically significant medication issues identified this  shift.    Bowel and Bladder Output:                       Bladder (# only)             Bowel (# only)  Number of Episodes  Continent            3                            0  Incontinent          0                            0    Additional Education Provided:    Education Provided: Precautions. Safety issues and interventions. Fall protocol.  Repeated re-orientation. Skin/wound care. Critical pressure areas. Prevention of  skin breakdown. Functional transfers. Medication. Name and dosage.  Administration. Purpose. Side Effects.       Audience: Patient.       Mode: Explanation.       Response: Verbalized understanding.  Needs reinforcement.    TEAM CARE PLAN  Identified problems from team documentation:  Problem: Impaired Cardiac Function  Cardiac: Primary Team Goal: Patient will display vital signs within  limits./Active  Cardiac: Additional Team Goal: Patient and caregiver will teack-back  BEFAST./Active    Problem: Impaired Cognition  Cognition: Primary Team Goal: Patient will demonstrate adequate attention and  visual / verbal processing to complete ADLs and mild / moderately complex  cognitive tasks for periods of 20  minutes provided min redirections./Active    Problem: Impaired Mobility  Mobility: Primary Team Goal: Pt will perform majority of mobility at a  supervision level with most appropriate assistive device for increased safety  and indepednence upon discharge./Active    Problem: Impaired Psychosocial Skills/Behavior  PsychoSocial: Primary Team Goal: Increase adjustment to changes post-stroke and  his next phase of life/    Problem: Impaired Self-care Mgmt/ADL/IADL  Self Care: Primary Team Goal: Pt will complete self carea at a superivsio/CGA  level provided by family.care giver in order to return home safely./Active    Problem: Impaired Swallowing  Swallowing: Primary Team Goal: Patient will incorporate swallow strategies to  maintain adequate mastication and functional oral clearance in order to safely  tolerate regular texture/thin liquid diet in 100% opportunities mod I./Active    Problem: Safety Risk and Restraint  Safety: Primary Team Goal: Patient will display learned safety  precautions./Active  Please review Integrated Patient View Care Plan Flowsheet for Team identified  Problems, Interventions, and Goals.    Signed by: Mariel Kansky, RN 10/17/2019 1:30:00 AM

## 2019-10-17 NOTE — Rehab Progress Note ([facility]) (Signed)
Michael White  MRN: [id number]  Account: [id number]  Session Start: 10/17/2019 12:00:00 AM  Session Stop: 10/17/2019 12:00:00 AM    Total Treatment Minutes:  Minutes    Rehabilitation Nursing  Inpatient Rehabilitation Shift Assessment    Rehab Diagnosis: revealed stable R thalamus hemorrhagic infarct.  Demographics:            Age: 7Y            Gender: Male  Primary Language: English    Date of Onset:  10/04/19  Date of Admission: 10/08/2019 4:57:00 PM    Rehabilitation Precautions Restrictions:   Risk of aspiration/swallowing. Mechanical soft  Risk for falls    Patient Report: " I am fine "  Patient/Caregiver Goals:  " I want to get better and walk safely again."    Wounds/Incisions: No wounds or incisions.    Medication Review: No clinically significant medication issues identified this  shift.    Bowel and Bladder Output:                       Bladder (# only)             Bowel (# only)  Number of Episodes  Continent            4                            0  Incontinent          0                            0    Additional Education Provided:    Education Provided: Safety.       Audience: Patient.       Mode: Explanation.       Response: Verbalized understanding.    TEAM CARE PLAN  Identified problems from team documentation:  Problem: Impaired Cardiac Function  Cardiac: Primary Team Goal: Patient will display vital signs within  limits./Active  Cardiac: Additional Team Goal: Patient and caregiver will teack-back  BEFAST./Active    Problem: Impaired Cognition  Cognition: Primary Team Goal: Patient will demonstrate adequate attention and  visual / verbal processing to complete ADLs and mild / moderately complex  cognitive tasks for periods of 20 minutes provided min redirections./Active    Problem: Impaired Mobility  Mobility: Primary Team Goal: Pt will perform majority of mobility at a  supervision level with most appropriate assistive device for increased safety  and indepednence upon  discharge./Active    Problem: Impaired Psychosocial Skills/Behavior  PsychoSocial: Primary Team Goal: Increase adjustment to changes post-stroke and  his next phase of life/    Problem: Impaired Self-care Mgmt/ADL/IADL  Self Care: Primary Team Goal: Pt will complete self carea at a superivsio/CGA  level provided by family.care giver in order to return home safely./Active    Problem: Impaired Swallowing  Swallowing: Primary Team Goal: Patient will incorporate swallow strategies to  maintain adequate mastication and functional oral clearance in order to safely  tolerate regular texture/thin liquid diet in 100% opportunities mod I./Active    Problem: Safety Risk and Restraint  Safety: Primary Team Goal: Patient will display learned safety  precautions./Active    Please review Integrated Patient View Care Plan Flowsheet for Team identified  Problems, Interventions, and Goals.    Signed by: Zorita Pang, RN 10/17/2019 4:34:00 PM

## 2019-10-18 LAB — BASIC METABOLIC PANEL
Anion Gap: 9 (ref 5.0–15.0)
BUN: 19 mg/dL (ref 9–28)
CO2: 27 mEq/L (ref 22–29)
Calcium: 9.5 mg/dL (ref 8.5–10.5)
Chloride: 100 mEq/L (ref 100–111)
Creatinine: 0.9 mg/dL (ref 0.7–1.3)
Glucose: 88 mg/dL (ref 70–100)
Potassium: 4.3 mEq/L (ref 3.5–5.1)
Sodium: 136 mEq/L (ref 136–145)

## 2019-10-18 LAB — CBC
Absolute NRBC: 0 10*3/uL (ref 0.00–0.00)
Hematocrit: 38.5 % (ref 37.6–49.6)
Hgb: 12.1 g/dL — ABNORMAL LOW (ref 12.5–17.1)
MCH: 21.1 pg — ABNORMAL LOW (ref 25.1–33.5)
MCHC: 31.4 g/dL — ABNORMAL LOW (ref 31.5–35.8)
MCV: 67.2 fL — ABNORMAL LOW (ref 78.0–96.0)
MPV: 9.1 fL (ref 8.9–12.5)
Nucleated RBC: 0 /100 WBC (ref 0.0–0.0)
Platelets: 434 10*3/uL — ABNORMAL HIGH (ref 142–346)
RBC: 5.73 10*6/uL (ref 4.20–5.90)
RDW: 21 % — ABNORMAL HIGH (ref 11–15)
WBC: 6.47 10*3/uL (ref 3.10–9.50)

## 2019-10-18 LAB — GFR: EGFR: >60

## 2019-10-18 MED ORDER — TRAZODONE HCL 50 MG PO TABS
50.0000 mg | ORAL_TABLET | Freq: Every evening | ORAL | Status: DC | PRN
Start: 2019-10-18 — End: 2019-10-21
  Administered 2019-10-18: 23:00:00 50 mg via ORAL
  Filled 2019-10-18: qty 1

## 2019-10-18 MED ORDER — SERTRALINE HCL 50 MG PO TABS
50.0000 mg | ORAL_TABLET | Freq: Every day | ORAL | Status: DC
Start: 2019-10-18 — End: 2019-10-21
  Administered 2019-10-18 – 2019-10-21 (×4): 50 mg via ORAL
  Filled 2019-10-18 (×4): qty 1

## 2019-10-18 NOTE — Rehab Progress Note ([facility]) (Signed)
[name]  MRN: [id number]  Account: [id number]  Session Start: 10/18/2019 2:00:00 PM  Session Stop: 10/18/2019 3:00:00 PM    Total Treatment Minutes: 60.00 Minutes    Physical Therapy  Inpatient Rehabilitation Progress Note    Rehab Diagnosis: revealed stable R thalamus hemorrhagic infarct.  Demographics:            Age: 84Y            Gender: Male  Rehabilitation Precautions/Restrictions:   Fall risk  impulsive, has telesitter    SUBJECTIVE  Patient Report: "I'm feeling very homesick today."  Patient/Caregiver Goals:  "I want to walk so I can go home to my wife."  Pain: Patient currently without complaints of pain.    OBJECTIVE  Vital Signs:                       Before Activity              After Activity  Vitals  BP Systolic          128                          131  BP Diastolic         76                           86  Pulse                103                          109      Interventions:       Therapeutic Activities:  Pt in bed upon PT arrival, agreeable to therapy  session and cleared by RN.  He comes to sitting at EOB with S and pivots to w/c  with CGA, able to complete task in a reasonable amount of time.  He practices  gait training CG/SBA with RW x 50' and without AD Min A 50' x 1 and 40' x 1;  increased time needed for ambulation and frequent cueing to increase step size.  Min A to transfer back to bed at end of session.  Patient was returned to or left in bed. Handoff to nurse completed.  Bed alarm in place and activated.  Oriented to call bell and placed within reach.  Personal items within reach.  Telesitter in use.      Lower Extremity Orthosis: Patient does not present with foot drop or ankle  instability and does not need an orthotic.  Equipment Provided: None at this time.      ASSESSMENT  Pt demos inconsistent improvements in initiation but continues to benefit from  cueing to keep activities very structured and automatic.    CARE Tool  Mobility Functional Assessment  Roll Left and  Right: 04 - Supervision: Helper provides supervision for safety or  verbal cues ONLY  Assistive Device(s):   Bed rail    Sit to Lying: 04 - Supervision: Helper provides supervision for safety or verbal  cues ONLY  Assistive Device(s):   Bed rail    Lying to Sitting on Side of Bed: 04 - Supervision: Helper provides supervision  for safety or verbal cues ONLY  Assistive Device(s):   Bed rail    Sit to Stand: 04 - Touching Assistance: Helper provides  touching/steadying/contact guard  Assistive Device(s):   Rolling walker    Chair/Bed-to-Chair Transfer: 04 - Touching Assistance: Helper provides  touching/steadying/contact guard  Assistive Device(s):   Rolling walker    Car Transfer: 88 - Not attempted due to medical or safety concerns.    Walk 10 Feet: 04 - Touching Assistance: Helper provides  touching/steadying/contact guard   RW    Walk 50 Feet with Two Turns: 04 - Touching Assistance: Helper provides  touching/steadying/contact guard   RW    Walk 150 Feet:  88 - Not attempted due to medical or safety concerns.    Walking 10 Feet on Uneven Surface: 88 - Not attempted due to medical or safety  concerns.    1 Step (curb):  04 - Touching Assistance: Helper provides  touching/steadying/contact guard   B rails    4 Steps:  04 - Touching Assistance: Helper provides touching/steadying/contact  guard   B rails    12 Steps: 95 - Not attempted due to medical or safety concerns.    Wheelchair: Patient does not use a wheelchair or scooter.    Long Term Goals: Pt will be independent with bed mobility on a flat bed without  bed rails for increased safety and independence upon discharge.  Pt will perform stand pivot transfers with most appropriate assistive device  with supervision for increased safety and independence upon discharge.  Pt will ambulate 150 supervision with most appropriate assistive device for  increased safety and independence upon discharge.  Pt will ascend/descend 12 stairs with one rail supervision for  increased safety  and independence upon discharge.  Pt and his family will participate in education/training to allow for a safe  discharge.  3 weeks from initial evaluation on 10/09/19.  Short Term Goals: Pt will perform supine to sit with head of bed raised with  supervision and use of bed rail.  Pt will perform stand pivot transfer with RW with CGA from bed to wheelchair.  Pt will ambulate 25' with min A using RW.  Pt will ascend/descend 4 stairs with min A and B UE on rails.  1 week from initial evaluation on 10/09/19.    Progress Towards Goals: SHORT TERM GOAL REVIEW:       1. Pt will perform supine to sit with head of bed raised with supervision  and use of bed rail. - Met       2. Pt will perform stand pivot transfer with RW with CGA from bed to  wheelchair. - Met       3. Pt will ambulate 25' with min A using RW. - Met       4. Pt will ascend/descend 4 stairs with min A and B UE on rails. - Met  Time frame to achieve short term goal(s):  STGs met    PLAN  Continued Physical Therapy is recommended.  Recommended Frequency/Duration/Intensity: 60-120 minutes per day, 5-6 days per  week for 3 weeks from initial evaluation on 10/09/19.  Activities Contributing Toward Care Plan: therapeutic exercise, therapeutic  activity, balance training, bed mobility training, strength training, gait  training, stair negotiation training, neuro re-ed, functional endurance, stroke  education, DME assessment, discharge planning in a 1:1 or small group setting in  a multidiscplinary environment.  The patient is not appropriate for group treatment.    Care Plan  Identified problems from team documentation:  Problem: Impaired Cardiac Function  Cardiac: Primary Team Goal: Patient will display vital signs within  limits./Active  Cardiac: Additional  Team Goal: Patient and caregiver will teach-back  BEFAST./Active    Problem: Impaired Cognition  Cognition: Primary Team Goal: Patient will demonstrate adequate attention and  visual / verbal  processing to complete ADLs and mild / moderately complex  cognitive tasks for periods of 20 minutes provided min redirections./Active    Problem: Impaired Mobility  Mobility: Primary Team Goal: Pt will perform majority of mobility at a  supervision level with most appropriate assistive device for increased safety  and independence upon discharge./Active    Problem: Impaired Psychosocial Skills/Behavior  PsychoSocial: Primary Team Goal: Increase adjustment to changes post-stroke and  his next phase of life/    Problem: Impaired Self-care Mgmt/ADL/IADL  Self Care: Primary Team Goal: Pt will complete self care at a supervision/CGA  level provided by family.care giver in order to return home safely./Active    Problem: Impaired Swallowing  Swallowing: Primary Team Goal: Patient will incorporate swallow strategies to  maintain adequate mastication and functional oral clearance in order to safely  tolerate regular texture/thin liquid diet in 100% opportunities mod I./Active    Problem: Safety Risk and Restraint  Safety: Primary Team Goal: Patient will display learned safety  precautions./Active    Add/Update Problems from this Treatment:  Update of existing problem:   Mobility: Supervision bed mobility, CGA stand pivot transfers, CGA gait  household distances with RW or Min A without AD, CGA to negotiate 4 stairs with  B rails.    Discipline:  Physical Therapy    Please review Integrated Patient View Care Plan Flowsheet for Team identified  Problems, Interventions, and Goals.    3 Hour Rule Minutes: 60 minutes of PT treatment this session count towards  intensity and duration of therapy requirement. Patient was seen for the full  scheduled time of PT treatment this session.  Therapy Mode Minutes: Individual: 60 minutes.    Signed by: Jaquita Rector, PT 10/18/2019 3:00:00 PM

## 2019-10-18 NOTE — Discharge Instr - AVS First Page (Addendum)
Reason for your Hospital Admission:  Intracerebral hemorrhage      Instructions for after your discharge:  Discharge to SNF

## 2019-10-18 NOTE — Consults (Signed)
FOOT AND ANKLE SURGERY CONSULTATION      Date Time: 10/18/19 1:51 PM  Patient Name: Michael White, Michael SR.  Attending Physician: Percell Locus*  Consulting Physician: Gearlean Alf, DPM      Reason for Consultation:   Calluses, long nails    Assessment:   Principal Problem:    Acute right thalamic hemorrhagic infarct  Resolved Problems:    * No resolved hospital problems. *    64 y.o. 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    Plan:   - Discussed with patient, not having pain currently in either foot, no concerns.  - Will leave contact info in chart, patient can follow up in office of Dr. Selena Batten for routine foot care.  - No indications for abx  - WBAT to the BLE  - DVT ppx: per primary team  - Plan discussed with attending, Dr. Selena Batten. Thank you for the consult Percell Locus*.    Carlena Hurl, DPM  Resident - Foot and Ankle Surgery      History of Present Illness:   Michael Lucken. is a 64 y.o. male who presents to the hospital with thickened and elongated nails. Has not seen a podiatrist in the past. Reports foot pain in the past but not now. No issues with feet at present.     Review of Systems:   A comprehensive review of systems was: Negative except as noted in the HPI.    Physical Exam:   BP 121/84    Pulse 99    Temp 98.2 F (36.8 C) (Oral)    Resp 16    Ht 1.803 m (5\' 11" )    Wt 63.5 kg (139 lb 15.9 oz)    SpO2 97%    BMI 19.52 kg/m     General: AAOx3, NAD, well appearance    Lower Extremity Exam:  Derm:  - Right: No open lesions, no interdigital maceration, no clinical signs of infection thickened, elongated mycotic nails  - Left: No open lesions, no interdigital maceration, no clinical signs of infection thickened, elongated mycotic nails    Vasc:  - Right: DP palpable, PT palpable. Cap refill < 3 sec to digits.  - Left: DP palpable, PT palpable. Cap refill < 3 sec to digits.    Neuro:  - Right: Gross sensation intact. Gross motor function intact.  - Left:  Gross sensation intact. Gross motor function intact.    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    Borderline hypertension     on meds in past; BP normal without meds recently       Past Surgical History:     Past Surgical History:   Procedure Laterality Date    CARDIAC ABLATION  04/15/2013    ablation of slow pathway for AVNRT       Family History:     Family History   Problem Relation Age of Onset    Heart disease Father         unknown what kind but not MI       Social History:     Social History     Socioeconomic History  Marital status: Legally Separated     Spouse name: Not on file    Number of children: Not on file    Years of education: Not on file    Highest education level: Not on file   Occupational History    Not on file   Tobacco Use    Smoking status: Former Smoker     Packs/day: 0.50     Years: 14.00     Pack years: 7.00     Types: Cigarettes     Quit date: 08/28/2012     Years since quitting: 7.1    Smokeless tobacco: Never Used   Substance and Sexual Activity    Alcohol use: Yes     Comment: occ beer some weekends    Drug use: No    Sexual activity: Not on file   Other Topics Concern    Not on file   Social History Narrative    Not on file     Social Determinants of Health     Financial Resource Strain:     Difficulty of Paying Living Expenses:    Food Insecurity:     Worried About Programme researcher, broadcasting/film/video in the Last Year:     Barista in the Last Year:    Transportation Needs:     Freight forwarder (Medical):     Lack of Transportation (Non-Medical):    Physical Activity:     Days of Exercise per Week:     Minutes of Exercise per Session:    Stress:      Feeling of Stress :    Social Connections:     Frequency of Communication with Friends and Family:     Frequency of Social Gatherings with Friends and Family:     Attends Religious Services:     Active Member of Clubs or Organizations:     Attends Banker Meetings:     Marital Status:    Intimate Partner Violence:     Fear of Current or Ex-Partner:     Emotionally Abused:     Physically Abused:     Sexually Abused:        Allergies:   No Known Allergies    Medications:     Prior to Admission medications    Medication Sig Start Date End Date Taking? Authorizing Provider   amLODIPine (NORVASC) 5 MG tablet Take 1 tablet (5 mg total) by mouth daily 10/08/19   Jolyn Lent, MD   hydrALAZINE (APRESOLINE) 50 MG tablet Take 1 tablet (50 mg total) by mouth every 8 (eight) hours 10/07/19   Jolyn Lent, MD       Labs:     Results     Procedure Component Value Units Date/Time    GFR [[id number]] Collected: 10/18/19 0557     Updated: 10/18/19 0709     EGFR >60.0    Basic Metabolic Panel [[id number]] Collected: 10/18/19 0557    Specimen: Blood Updated: 10/18/19 0709     Glucose 88 mg/dL      BUN 19 mg/dL      Creatinine 0.9 mg/dL      Calcium 9.5 mg/dL      Sodium 657 mEq/L      Potassium 4.3 mEq/L      Chloride 100 mEq/L      CO2 27 mEq/L      Anion Gap 9.0    CBC without differential [[id number]]  (Abnormal) Collected: 10/18/19 0557  Specimen: Blood Updated: 10/18/19 0642     WBC 6.47 x10 3/uL      Hgb 12.1 g/dL      Hematocrit 16.1 %      Platelets 434 x10 3/uL      RBC 5.73 x10 6/uL      MCV 67.2 fL      MCH 21.1 pg      MCHC 31.4 g/dL      RDW 21 %      MPV 9.1 fL      Nucleated RBC 0.0 /100 WBC      Absolute NRBC 0.00 x10 3/uL           Radiology:     Radiology Results (24 Hour)     ** No results found for the last 24 hours. **

## 2019-10-18 NOTE — Rehab Progress Note ([facility]) (Signed)
NAMEDEVESH White  MRN: [id number]  Account: [id number]  Session Start: 10/18/2019 12:00:00 AM  Session Stop: 10/18/2019 12:00:00 AM    Total Treatment Minutes:  Minutes    Rehabilitation Nursing  Inpatient Rehabilitation Shift Assessment    Rehab Diagnosis: revealed stable R thalamus hemorrhagic infarct.  Demographics:            Age: 11Y            Gender: Male  Primary Language: English    Date of Onset:  10/04/19  Date of Admission: 10/08/2019 4:57:00 PM    Rehabilitation Precautions Restrictions:   Risk of aspiration/swallowing. Mechanical soft  Risk for falls    Patient Report: "I need to use the bathroom."  Patient/Caregiver Goals:  " I want to get better and walk safely again."    Wounds/Incisions: No wounds or incisions.    Medication Review: No clinically significant medication issues identified this  shift.    Bowel and Bladder Output:                       Bladder (# only)             Bowel (# only)  Number of Episodes  Continent            3                            2  Incontinent          1                            0    Additional Education Provided:    Education Provided: Precautions. Plan of care. Safety issues and interventions.  Fall protocol. Supervision requirements. Medication. Administration. Name and  dosage. Purpose. Side Effects. Interaction.       Audience: Patient.       Mode: Explanation.       Response: Verbalized understanding.  Needs reinforcement.    TEAM CARE PLAN  Identified problems from team documentation:  Problem: Impaired Cardiac Function  Cardiac: Primary Team Goal: Patient will display vital signs within  limits./Active  Cardiac: Additional Team Goal: Patient and caregiver will teack-back  BEFAST./Active    Problem: Impaired Cognition  Cognition: Primary Team Goal: Patient will demonstrate adequate attention and  visual / verbal processing to complete ADLs and mild / moderately complex  cognitive tasks for periods of 20 minutes provided min redirections./Active    Problem:  Impaired Mobility  Mobility: Primary Team Goal: Pt will perform majority of mobility at a  supervision level with most appropriate assistive device for increased safety  and indepednence upon discharge./Active    Problem: Impaired Psychosocial Skills/Behavior  PsychoSocial: Primary Team Goal: Increase adjustment to changes post-stroke and  his next phase of life/    Problem: Impaired Self-care Mgmt/ADL/IADL  Self Care: Primary Team Goal: Pt will complete self carea at a superivsio/CGA  level provided by family.care giver in order to return home safely./Active    Problem: Impaired Swallowing  Swallowing: Primary Team Goal: Patient will incorporate swallow strategies to  maintain adequate mastication and functional oral clearance in order to safely  tolerate regular texture/thin liquid diet in 100% opportunities mod I./Active    Problem: Safety Risk and Restraint  Safety: Primary Team Goal: Patient will display learned safety  precautions./Active    Please review Integrated Patient View  Care Plan Flowsheet for Team identified  Problems, Interventions, and Goals.    Signed by: Rosemary Holms, RN 10/18/2019 1:30:00 AM

## 2019-10-18 NOTE — Progress Notes (Signed)
PROGRESS NOTE -- FACE-TO-FACE ENCOUNTER  PHYSICAL MEDICINE AND REHABILITATION      Date Time: 10/18/19 3:57 PM  Patient Name: Michael White, Michael SR.    Admission date:  10/08/2019  Functional Status/Update:   I reviewed patient's therapy notes to assess functional status and ongoing need for therapies.   OT: Patient participates in OT session with focus on L  attention and problem solving utilizing puzzle activity. Patient completes 40%  of puzzle with 75% accuracy, but requires extra time. Patient presents today  with modd alterations and repeatedly asks to go home. He is educated by this  Clinical research associate, CM, and MD that safest discharge at this time is to SNF for continued  Therapies.    SLP: Apathetic and flat affect remained throughout session. Pt  vocalized desire to return home often. SLP incorporated calendar into  conversation, as visual means to increase understanding and retention of medical  timeline. Patient answered 4/4 questions regarding current location, situation  while utilizing calendar provided priming cues. With calendar placed in front of  him, he recognized and identified upcoming d/c date. Pt followed two part  (verbal and written) directions within concrete card sorting task provided  overall minimal priming cueing. Increased verbal cueing, up to moderate assist,  necessary while following three part directions within single familiar task (ie  using telephone). Pt continues to present with mod/severe processing delay, and  benefits from multiple verbal prompts to initiate motor based tasks.    Subjective/ROS:   Patient was seen and examined today. He is participating in OT.  Appears sad and flat.  He states he would like to go home.  He was encouraged to try and maximize his time here and with therapies before returning home, as he still has some work to do.  He agreed. Therapist and nurse report overgrown toenails. He reports having the same dreams every night.    Medications:   Medications reviewed  by me:     Scheduled Meds: PRN Meds:    amLODIPine, 5 mg, Oral, Daily  hydrALAZINE, 50 mg, Oral, Q8H  senna-docusate, 2 tablet, Oral, Daily  sertraline, 50 mg, Oral, Daily  tamsulosin, 0.4 mg, Oral, Daily after dinner  vitamin D (ergocalciferol), 50,000 Units, Oral, Q Sun (AM)        Continuous Infusions:   acetaminophen, 650 mg, Q6H PRN  bisacodyl, 10 mg, Daily PRN  lactulose, 20 g, Q4H PRN  lidocaine, , PRN  ondansetron, 4 mg, Q8H PRN  traZODone, 50 mg, QHS PRN              Physical Exam:     Vitals:    10/18/19 1321 10/18/19 1422 10/18/19 1448 10/18/19 1545   BP: 121/84 128/76 131/86 134/70   Pulse:  (!) 103 (!) 109 100   Resp:    18   Temp:    98.4 F (36.9 C)   TempSrc:    Oral   SpO2:    98%   Weight:       Height:         General: NAD, well nourished  HEENT: NC/AT, mouth mucosa pink and moist  Heart: RRR  Lungs:CTAB  Abdomen: Soft, non-tender, +BS  Extremities: no edema, palpable pulses  Neuro: Awake  Psych: Flat Affect    Intake and Output Summary (Last 24 hours) at Date Time    Intake/Output Summary (Last 24 hours) at 10/18/2019 1557  Last data filed at 10/18/2019 1321  Gross per 24 hour   Intake  840 ml   Output 800 ml   Net 40 ml     P.O.: 240 mL (10/18/19 1300)     Urine: 200 mL (10/18/19 1321)  Bladder Scan Volume (mL): 0 mL (10/17/19 1500)  Intermittent/Straight Cath (mL): (Patient refused) (10/11/19 0537)         Labs:   No results for input(s): GLUCOSEWHOLE in the last 24 hours.    Recent Labs   Lab 10/18/19  0557 10/16/19  0654 10/14/19  0503   WBC 6.47 7.37 6.90   Hgb 12.1* 12.9 13.1   Hematocrit 38.5 41.2 41.8   Platelets 434* 442* 432*        Recent Labs   Lab 10/18/19  0557 10/16/19  0654   Sodium 136 137   Potassium 4.3 4.2   Chloride 100 99*   CO2 27 26   BUN 19 19   Creatinine 0.9 0.9   Calcium 9.5 9.9   Albumin  --  4.0   Protein, Total  --  7.6   Bilirubin, Total  --  0.6   Alkaline Phosphatase  --  84   ALT  --  27   AST (SGOT)  --  20   Glucose 88 84             Results     Procedure  Component Value Units Date/Time    GFR [[id number]] Collected: 10/18/19 0557     Updated: 10/18/19 0709     EGFR >60.0    Basic Metabolic Panel [[id number]] Collected: 10/18/19 0557    Specimen: Blood Updated: 10/18/19 0709     Glucose 88 mg/dL      BUN 19 mg/dL      Creatinine 0.9 mg/dL      Calcium 9.5 mg/dL      Sodium 782 mEq/L      Potassium 4.3 mEq/L      Chloride 100 mEq/L      CO2 27 mEq/L      Anion Gap 9.0    CBC without differential [[id number]]  (Abnormal) Collected: 10/18/19 0557    Specimen: Blood Updated: 10/18/19 0642     WBC 6.47 x10 3/uL      Hgb 12.1 g/dL      Hematocrit 57.8 %      Platelets 434 x10 3/uL      RBC 5.73 x10 6/uL      MCV 67.2 fL      MCH 21.1 pg      MCHC 31.4 g/dL      RDW 21 %      MPV 9.1 fL      Nucleated RBC 0.0 /100 WBC      Absolute NRBC 0.00 x10 3/uL              Rads:   Radiological Procedure reviewed.  Radiology Results (24 Hour)     ** No results found for the last 24 hours. **          Assessment/Plan:     Continue PT, OT in acute rehabilitation medical setting.  Continue comprehensive and intensive inpatient rehab program, including: Physical therapy 60-120 min daily, 5-6 times per week, Occupational therapy 60-120 min daily, 5-6 times per week, Case management and Rehabilitation nursing for the following:    Hemorrhagic stroke.   Patient Active Problem List   Diagnosis    Essential hypertension    Nicotine abuse    Partial tear of right rotator cuff    H/O  cardiac radiofrequency ablation    Premature atrial contractions    Premature ventricular contractions    Acute CVA (cerebrovascular accident)    Acute right thalamic hemorrhagic infarct    Hypertensive urgency    Acute metabolic encephalopathy    Cerebrovascular accident (CVA) of right thalamus     #Right thalamic hemorrhage in the setting of hypertensive emergency with deficits in mobility and ADLs: OOB, fall precautions  Keppra for seizure prophylaxis-completed  5/14 CT Head shows resolution of  intracranial hemorrhage    #Pain Management: Tylenol PRN    #DVT Prophylaxis: SCDs. No pharmacologics due to intracranial bleed    #Bowel/Bladder Management: PVRs to rule out urinary retention. Use Senna and Colace    #Muscle cramps: Patient states these are chronic for >12 years which he attributes to constantly moving heavy furniture  Flexeril [place]'d 5/9  CK levels normal    #Skin Care: Keep skin clean and dry    #Comorbities (HTN): Continue Norvasc, Hydralazine    #Psych (hx tobacco dependence-Quit Sep 13, 2019): Psychology services for evaluation and treatment as needed for adjustment/coping   Endorses a lot of stressors including financial and family given his wife has Alzheimer's and he is the bread winner.  5/17 Start Zoloft for depression    #Hyperkeratosis: Podiatry consulted    #FEN/GI (Dysphagia): Mechanical soft diet, SLP to follow    #Dispo: TBD pending rehab progress    Medication Review  A complete drug regimen review was completed: Yes  A complete drug regimen review was completed and no drug issues were found.    Discussed plan with patient. All questions were answered.  Signed by: Clover Mealy DO    [facility]

## 2019-10-18 NOTE — Rehab Progress Note ([facility]) (Signed)
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  Demographics:            Age: 24Y            Gender: Male  Primary Language: English    Date of Onset:  10/04/19  Date of Admission: 10/08/2019 4:57:00 PM    Rehabilitation Precautions Restrictions:   Risk of aspiration/swallowing. Mechanical soft  Risk for falls    Patient Report: "I am fine"  Patient/Caregiver Goals:  " I want to get better and walk safely again."    Wounds/Incisions: No wounds or incisions.    Medication Review: No clinically significant medication issues identified this  shift.    Bowel and Bladder Output:                       Bladder (# only)             Bowel (# only)  Number of Episodes  Continent            2                            0  Incontinent          1                            0    Additional Education Provided:    Education Provided: Precautions. Pain management. Pain scale. Medication  options. Side effects. Safety issues and interventions. Fall protocol.  Medication. Name and dosage. Administration. Purpose. Side Effects.       Audience: Patient.       Mode: Explanation.       Response: Verbalized understanding.  Needs reinforcement.    TEAM CARE PLAN  Identified problems from team documentation:  Problem: Impaired Cardiac Function  Cardiac: Primary Team Goal: Patient will display vital signs within  limits./Active  Cardiac: Additional Team Goal: Patient and caregiver will teack-back  BEFAST./Active    Problem: Impaired Cognition  Cognition: Primary Team Goal: Patient will demonstrate adequate attention and  visual / verbal processing to complete ADLs and mild / moderately complex  cognitive tasks for periods of 20 minutes provided min redirections./Active    Problem: Impaired  Mobility  Mobility: Primary Team Goal: Pt will perform majority of mobility at a  supervision level with most appropriate assistive device for increased safety  and indepednence upon discharge./Active    Problem: Impaired Psychosocial Skills/Behavior  PsychoSocial: Primary Team Goal: Increase adjustment to changes post-stroke and  his next phase of life/    Problem: Impaired Self-care Mgmt/ADL/IADL  Self Care: Primary Team Goal: Pt will complete self carea at a superivsio/CGA  level provided by family.care giver in order to return home safely./Active    Problem: Impaired Swallowing  Swallowing: Primary Team Goal: Patient will incorporate swallow strategies to  maintain adequate mastication and functional oral clearance in order to safely  tolerate regular texture/thin liquid diet in 100% opportunities mod I./Active    Problem: Safety Risk and Restraint  Safety: Primary Team Goal: Patient will display learned safety  precautions./Active    Please review Integrated Patient View Care  Plan Flowsheet for Team identified  Problems, Interventions, and Goals.    Signed by: Oscar La, RN 10/18/2019 1:15:00 PM

## 2019-10-18 NOTE — Rehab Progress Note ([facility]) (Signed)
[name]  MRN: [id number]  Account: [id number]  Session Start: 10/18/2019 12:00:00 AM  Session Stop: 10/18/2019 12:00:00 AM    Total Treatment Minutes:  Minutes    Therapeutic Recreation  Inpatient Rehabilitation Exception Note    Patient was unable to complete planned session for the following reasons:  Patient not tolerate secondary to fatigue.  Will attempt to see patient tomorrow,  as scheduled    Signed by: Idolina Primer, CTRS 10/18/2019 10:00:00 AM

## 2019-10-18 NOTE — Rehab Progress Note ([facility]) (Signed)
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  Demographics:            Age: 8Y            Gender: Male  Rehabilitation Precautions/Restrictions:   Risk for falls    SUBJECTIVE  Patient Report: Per physician report, head CT on 5/14 indicated no new  abnormalities. UA negative for UTI. Appreciate RN report stating no onset of  confusion this morning.  Patient/Caregiver Goals:  " I want to get better and walk safely again."  Pain: Patient currently without complaints of pain.    OBJECTIVE    Interventions:       Speech Treatment: SLP intervention targeted processing, attention and  initiation.       Speech Treatment: Apathetic and flat affect remained throughout session. Pt  vocalized desire to return home often. SLP incorporated calendar into  conversation, as visual means to increase understanding and retention of medical  timeline. Patient answered 4/4 questions regarding current location, situation  while utilizing calendar provided priming cues. With calendar placed in front of  him, he recognized and identified upcoming d/c date. Pt followed two part  (verbal and written) directions within concrete card sorting task provided  overall minimal priming cueing. Increased verbal cueing, up to moderate assist,  necessary while following three part directions within single familiar task (ie  using telephone). Pt continues to present with mod/severe processing delay, and  benefits from multiple verbal prompts to initiate motor based tasks.  Patient was left seated in chair at nurse's station.  Patient was handed off to next therapist. Handoff to nurse completed.  Chair alarm in place and activated.  Personal items within reach.  Pain Reassessment: Pain was  not reassessed as no pain was reported.    ASSESSMENT  Patient has demonstrated minimal functional gains as compared to start of care.  He demonstrated a slight decline in status late last week, with present  confusion and hallucinations, although currently altered mental status has  subsided. He continues to require moderate cues to initiate motor based tasks,  and performance is most effective when directions are < 2 steps. Current  barriers to continued progress are heavily impacted by emotional state. SLP  notified neuropsych and rest of team members. Regarding swallow function, he  safely transitioned from mech soft to regular texture solids and is tolerating  upgraded diet well.    Long Term Goals: Patient will incorporate swallow strategies to maintain  adequate mastication and functional oral clearance in order to safely tolerate  regular texture/thin liquid diet in 100% opportunities mod I.  Patient will demonstrate adequate attention and visual / verbal processing to  complete ADLs and mild / moderately complex cognitive tasks for periods of 20  minutes provided min redirections.  Pt. will demonstrate use of working memory strategies (verbal rehearsal,  repetition, clarification) in order to acquire and retain task instructions and  strategies in environmental navigation and simple visuo-spatial activities with  min A.  2-3 weeks from IE on 5/8  Short Term Goals: Patient will demonstrate improved initiation and processing by  following 2-3 step directions given moderate cues to increase safety upon d/c.  Patient will demonstrate adequate left visual attention to identify items in  his  proximal, mildly distracting environment 80% of the time provided verbal prompts  as needed.  Patient will implement safe swallow techniques, specifically reduced intake  pace, alternate liquids/solids within regular meal trial provided supervision as  needed.  1 week from IE    Progress Toward Goals:   SHORT TERM GOAL  REVIEW:       1. Patient will demonstrate improved initiation and processing by following  2-3 step directions given moderate cues to increase safety upon d/c. - Met       2. Patient will demonstrate adequate left visual attention to identify  items in his proximal, mildly distracting environment 80% of the time provided  verbal prompts as needed. - Not Met: Minimal carryover of strategies to improve  L attention       3. Patient will implement safe swallow techniques, specifically reduced  intake pace, alternate liquids/solids within regular meal trial provided  supervision as needed. - Met  Time frame to achieve short term goal(s):  STGs addressed. Will focus on LTGs  until d/c   LONG TERM GOAL REVIEW:       1. Patient will incorporate swallow strategies to maintain adequate  mastication and functional oral clearance in order to safely tolerate regular  texture/thin liquid diet in 100% opportunities mod I. - Met       2. Patient will demonstrate adequate attention and visual / verbal  processing to complete ADLs and mild / moderately complex cognitive tasks for  periods of 20 minutes provided min redirections. - Not Met: Continues to require  moderate A to initiate and execute familiar, concrete tasks. Goal modified.       New Goal: Patient will demonstrate adequate attention and visual / verbal  processing to complete ADLs  of 10 minutes provided min/moderate verbal  prompting/cues.       3. Pt. will demonstrate use of working memory strategies (verbal rehearsal,  repetition, clarification) in order to acquire and retain task instructions and  strategies in environmental navigation and simple visuo-spatial activities with  min A. - Not Met: Not attainable at this time. Will focus on processing  initiation until d/c  Time frame to achieve long term goal(s):  Until 5/20    PLAN  Continued Speech Language Pathology is recommended to address:  Recommended Frequency/Duration/Intensity: 1:1 or group settings, 60 -  120  minutes/day, 5-6x/week until 5/20  Continued Activities Contributing Toward Care Plan: cognitive re-training,  cognitive compensatory strategy trianing,  collaborative d/c planning, stroke  education      Care Plan  Identified problems from team documentation:  Problem: Impaired Cardiac Function  Cardiac: Primary Team Goal: Patient will display vital signs within  limits./Active  Cardiac: Additional Team Goal: Patient and caregiver will teack-back  BEFAST./Active    Problem: Impaired Cognition  Cognition: Primary Team Goal: Patient will demonstrate adequate attention and  visual / verbal processing to complete ADLs and mild / moderately complex  cognitive tasks for periods of 20 minutes provided min redirections./Active    Problem: Impaired Mobility  Mobility: Primary Team Goal: Pt will perform majority of mobility at a  supervision level with most appropriate assistive device for increased safety  and indepednence upon discharge./Active    Problem: Impaired Psychosocial Skills/Behavior  PsychoSocial: Primary Team Goal: Increase adjustment to changes post-stroke and  his next phase of life/    Problem: Impaired Self-care Mgmt/ADL/IADL  Self Care: Primary Team Goal: Pt will complete self carea at a superivsio/CGA  level provided by family.care  giver in order to return home safely./Active    Problem: Impaired Swallowing  Swallowing: Primary Team Goal: Patient will incorporate swallow strategies to  maintain adequate mastication and functional oral clearance in order to safely  tolerate regular texture/thin liquid diet in 100% opportunities mod I./Active    Problem: Safety Risk and Restraint  Safety: Primary Team Goal: Patient will display learned safety  precautions./Active    Add/Update Problems from this Treatment:  Update of existing problem:   Cognition: Continues to require moderate A to initiate and execute familiar,  concrete tasks.Moderate delayed processing within 2 step tasks, requiring  consistent  prompting for ADL task drive.    Discipline:  Speech/Language Pathology    Please review Integrated Patient View Care Plan Flowsheet for Team identified  Problems, Interventions, and Goals.    3 Hour Rule Minutes: 60 minutes of SLP treatment this session count towards  intensity and duration of therapy requirement. Patient was seen for the full  scheduled time of SLP treatment this session.  Therapy Mode Minutes: Individual: 60 minutes.    Signed by: Jamison Neighbor, M.A., CCC/SLP 10/18/2019 11:00:00 AM

## 2019-10-18 NOTE — Rehab Progress Note ([facility]) (Signed)
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  Demographics:            Age: 73Y            Gender: Male  Rehabilitation Precautions/Restrictions:   Risk of aspiration/swallowing. Mechanical soft  Risk for falls    Vital Signs:                       Before Activity              After Activity  Vitals  BP Systolic          -                            118  BP Diastolic         -                            67  Pulse                -                            106    Interventions:       Therapeutic Activities:  Patient participates in OT session with focus on L  attention and problem solving utilizing puzzle activity. Patient completes 40%  of puzzle with 75% accuracy, but requires extra time. Patient presents today  with modd alterations and repeatedly asks to go home. He is educated by this  Clinical research associate, CM, and MD that safest discharge at this time is to SNF for continued  therapies.    He completes sock don/doffing with min A to manage over toe nails. Patient  demonstrates enough insight to ask for Podiatrist.  Patient was left seated in chair at nurse's station. Handoff to nurse completed.  Chair alarm in place and activated.  Personal items within reach.    ASSESSMENT  Patient will benefit from Zoom visit with his wife to lift mood, as well as from  Neuro[facility] for coping. Patient otherwise demo's overall progress with self care,  however remains limited by cognitive deficts, and L inattention.    3 Hour Rule Minutes: 60 minutes of OT treatment this session count towards  intensity and duration of therapy requirement. Patient was seen for the full  scheduled time of OT treatment this session.  Therapy Mode Minutes: Individual: 60 minutes.    Signed by: Elsie Amis, OTR/L 10/18/2019 12:00:00 PM

## 2019-10-19 NOTE — Rehab Progress Note ([facility]) (Signed)
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  Demographics:            Age: 26Y            Gender: Male  Rehabilitation Precautions/Restrictions:   Fall risk  impulsive, has telesitter    OBJECTIVE  Vital Signs:                       Before Activity              After Activity  Vitals  BP Systolic          128                          -  BP Diastolic         86                           -  Pulse                90                           -    Interventions:       Therapeutic Activities:  Pt resting in bed upon PT arrival, agreeable to  therapy session and cleared by RN; received hand off that pt would like to be  able to go outside.  While outside pt practices multiple trials of ambulating  household distances with Min A without AD.  He practices reaching to the ground  to retrieve items with CGA, and engages in bean bag toss with CG/Min A.  Pt  partway through transferring back to bed following session when he reports the  need to urinate.  He transfers to toilet with SBA using grab bar, and manages  clothing with steadying assist for balance.  After using bathroom pt transfers  back to bed with CGA using arm rest/bed rail for steadying.  Patient was returned to or left in bed. Handoff to nurse completed.  Bed alarm in place and activated.  Oriented to call bell and placed within reach.  Personal items within reach.  Telesitter in use.    ASSESSMENT  Pt demos increases retropulsion due to fear of falling when standing and walking  unsupported, but with increased practice is able to better achieve midline  during functional movements.    3 Hour Rule Minutes: 70 minutes of PT treatment this session count towards  intensity and duration of therapy requirement.  Patient was seen for the full  scheduled time of PT treatment this session.  Therapy Mode Minutes: Individual: 70 minutes.    Signed by: Jaquita Rector, PT 10/19/2019 12:10:00 PM

## 2019-10-19 NOTE — Rehab Progress Note ([facility]) (Signed)
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  Demographics:            Age: 58Y            Gender: Male  Primary Language: English    Date of Onset:  10/04/19  Date of Admission: 10/08/2019 4:57:00 PM    Rehabilitation Precautions Restrictions:   Fall risk  impulsive, has telesitter    Patient Report: "Im fine."  Patient/Caregiver Goals:  "I want to walk so I can go home to my wife."    Wounds/Incisions: No wounds or incisions.    Medication Review: No clinically significant medication issues identified this  shift.    Bowel and Bladder Output:                       Bladder (# only)             Bowel (# only)  Number of Episodes  Continent            2                            0  Incontinent          0                            0    Additional Education Provided:    Education Provided: Precautions. Plan of care. Safety issues and interventions.  Fall protocol. Supervision requirements. Medication. Name and dosage.  Administration. Purpose. Side Effects. Interaction.       Audience: Patient.       Mode: Explanation.       Response: Verbalized understanding.  Needs reinforcement.    TEAM CARE PLAN  Identified problems from team documentation:  Problem: Impaired Cardiac Function  Cardiac: Primary Team Goal: Patient will display vital signs within  limits./Active  Cardiac: Additional Team Goal: Patient and caregiver will teack-back  BEFAST./Active    Problem: Impaired Cognition  Cognition: Primary Team Goal: Patient will demonstrate adequate attention and  visual / verbal processing to complete ADLs and mild / moderately complex  cognitive tasks for periods of 20 minutes provided min redirections./Active    Problem: Impaired Mobility  Mobility: Primary Team Goal:  Pt will perform majority of mobility at a  supervision level with most appropriate assistive device for increased safety  and indepednence upon discharge./Active    Problem: Impaired Psychosocial Skills/Behavior  PsychoSocial: Primary Team Goal: Increase adjustment to changes post-stroke and  his next phase of life/    Problem: Impaired Self-care Mgmt/ADL/IADL  Self Care: Primary Team Goal: Pt will complete self carea at a superivsio/CGA  level provided by family.care giver in order to return home safely./Active    Problem: Impaired Swallowing  Swallowing: Primary Team Goal: Patient will incorporate swallow strategies to  maintain adequate mastication and functional oral clearance in order to safely  tolerate regular texture/thin liquid diet in 100% opportunities mod I./Active    Problem: Safety Risk and Restraint  Safety: Primary Team Goal: Patient will display learned safety  precautions./Active    Please review Integrated Patient View Care Plan Flowsheet for Team  identified  Problems, Interventions, and Goals.    Signed by: Michael Holms, RN 10/19/2019 11:17:00 PM

## 2019-10-19 NOTE — Progress Notes (Signed)
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  Demographics:            Age: 37Y            Gender: Male  Rehabilitation Precautions/Restrictions:   Fall risk  impulsive, has telesitter    OBJECTIVE  Group Type: Stroke education class was held in dayroom setting.  Group Participant(s): Patient    Treatment Provided: Participant(s) received education on the following topics:  what is stroke, types of stroke, signs /T/ symptoms associated with stroke,  anatomy/physiology of the brain, stroke prevention, stroke recovery, nutrition    Participants received education via explanation, printed material, class  Participant(s) verbalized understanding    ASSESSMENT  Participant(s) benefitted from support and encouragement of peers in a social  setting.  Participant(s) shared personal experiences.  Participant(s) had good participation.    Signed by: Trenda Moots, M.A., CCC/SLP 10/19/2019 4:00:00 PM

## 2019-10-19 NOTE — Rehab Progress Note ([facility]) (Signed)
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  Demographics:            Age: 1Y            Gender: Male    Medications and Allergies: Significant rehabilitation considerations:   Please see EPIC.  Rehabilitation Precautions/Restrictions:   Fall risk  impulsive, has telesitter    OBJECTIVE  Mental Status and Behavioral Observation: The patient was seen for an initial  psychological consultation in the patient?s room to assess for adjustment to  medical situation and rehabilitation hospitalization in accordance with the  attending physician?s initial plan of care. The patient was found lying in his  bed. He was alert and cooperative. Michael [name] was oriented to person, place,  time, date, and situation. Affect was euthymic, and he made appropriate eye  contact. He was engaging throughout the session and demonstrated spontaneous  speech. Personal grooming and hygiene were good. Speech was of normal rhythm,  volume, rate, and prosody.  Processing speed was slow but adequate for this  session. Behavioral signs of pain were not observed. There was no evidence of  him responding to internal stimuli, and there was no delusional content in his  speech. At the time of the session the patient did not appear to be in or report  any acute distress. Thought process was linear and goal oriented; content was  appropriate. Patient did not express any suicidal ideation, intent, and/or plan.    Interventions:   NPSY INDIVID HLTH INTERV INT   NPSY INDIVID HLTH INTERV ADD    ASSESSMENT  Impressions:  During this session, the patient discussed worries and nightmares  related to anxiety. Anxiety was processed and management techniques were  explored. The patient was given  space to express his worries and concerns.  Mindfulness meditation techniques were practiced. Person-centered and ACT  techniques were utilized in this session.    PLAN  Continued Psychology services are recommended to address: Increase adjustment to  changes post-stroke and his next phase of life  Recommendations:  Individual and co-treat as needed    Signed by: Raynald Blend, PhD 10/19/2019 11:00:00 AM

## 2019-10-19 NOTE — Rehab Progress Note ([facility]) (Signed)
Michael [name]  MRN: [id number]  Account: [id number]  Session Start: 10/19/2019 12:00:00 AM  Session Stop: 10/19/2019 12:00:00 AM    Total Treatment Minutes:  Minutes    Rehabilitation Nursing  Inpatient Rehabilitation Shift Assessment    Rehab Diagnosis: revealed stable R thalamus hemorrhagic infarct.  Demographics:            Age: 13Y            Gender: Male  Primary Language: English    Date of Onset:  10/04/19  Date of Admission: 10/08/2019 4:57:00 PM    Rehabilitation Precautions Restrictions:   Fall risk  impulsive, has telesitter    Patient Report: "I'm ok."  Patient/Caregiver Goals:  "I want to walk so I can go home to my wife."    Wounds/Incisions: No wounds or incisions.    Medication Review: No clinically significant medication issues identified this  shift.    Bowel and Bladder Output:                       Bladder (# only)             Bowel (# only)  Number of Episodes  Continent            3                            0  Incontinent          0                            0    Additional Education Provided:    Education Provided: Precautions. Pain management. Pain scale. Plan of care.  Safety issues and interventions. Fall protocol. Supervision requirements.  Skin/wound care. Prevention of skin breakdown. Bed mobility. Safety. Medication.  Name and dosage. Administration. Purpose. Side Effects. Cardiac. Monitoring  heart rate. Monitoring blood pressure. Stroke. Medication compliance, Control  blood pressure, high cholesterol and/or diabetes, Healthy diet       Audience: Patient.       Mode: Explanation.       Response: Needs reinforcement.    TEAM CARE PLAN  Identified problems from team documentation:  Problem: Impaired Cardiac Function  Cardiac: Primary Team Goal: Patient will display vital signs within  limits./Active  Cardiac: Additional Team Goal: Patient and caregiver will teack-back  BEFAST./Active    Problem: Impaired Cognition  Cognition: Primary Team Goal: Patient will demonstrate adequate  attention and  visual / verbal processing to complete ADLs and mild / moderately complex  cognitive tasks for periods of 20 minutes provided min redirections./Active    Problem: Impaired Mobility  Mobility: Primary Team Goal: Pt will perform majority of mobility at a  supervision level with most appropriate assistive device for increased safety  and indepednence upon discharge./Active    Problem: Impaired Psychosocial Skills/Behavior  PsychoSocial: Primary Team Goal: Increase adjustment to changes post-stroke and  his next phase of life/    Problem: Impaired Self-care Mgmt/ADL/IADL  Self Care: Primary Team Goal: Pt will complete self carea at a superivsio/CGA  level provided by family.care giver in order to return home safely./Active    Problem: Impaired Swallowing  Swallowing: Primary Team Goal: Patient will incorporate swallow strategies to  maintain adequate mastication and functional oral clearance in order to safely  tolerate regular texture/thin liquid diet in 100% opportunities mod I./Active  Problem: Safety Risk and Restraint  Safety: Primary Team Goal: Patient will display learned safety  precautions./Active    Please review Integrated Patient View Care Plan Flowsheet for Team identified  Problems, Interventions, and Goals.    Signed by: Haze Rushing, RN 10/19/2019 1:09:00 AM

## 2019-10-19 NOTE — Rehab Progress Note ([facility]) (Signed)
Michael [name]  MRN: [id number]  Account: [id number]  Session Start: 10/19/2019 12:00:00 AM  Session Stop: 10/19/2019 12:00:00 AM    Total Treatment Minutes:  Minutes    Rehabilitation Nursing  Inpatient Rehabilitation Shift Assessment    Rehab Diagnosis: revealed stable R thalamus hemorrhagic infarct.  Demographics:            Age: 27Y            Gender: Male  Primary Language: English    Date of Onset:  10/04/19  Date of Admission: 10/08/2019 4:57:00 PM    Rehabilitation Precautions Restrictions:   Fall risk  impulsive, has telesitter    Patient Report: Good morning, I'm feeling good  Patient/Caregiver Goals:  "I want to walk so I can go home to my wife."    Wounds/Incisions: No wounds or incisions.    Medication Review: No clinically significant medication issues identified this  shift.    Bowel and Bladder Output:                       Bladder (# only)             Bowel (# only)  Number of Episodes  Continent            3                            0  Incontinent          0                            0    Additional Education Provided:    Education Provided: Fall prevention/balance training. Cognitive functioning.  Stroke. Signs /T/ Symptoms associated with stroke, Stroke prevention, Medication  compliance, Control blood pressure, high cholesterol and/or diabetes, Increase  physical activity, Stroke recovery, Emotional adjustments/depression, Pressure  ulcer, Pulmonary complications, Supervision/assistance requirements       Audience: Patient.       Mode: Explanation.  Demonstration.  Stroke ed booklet.  Class.       Response: Applied knowledge.  Verbalized understanding.  Demonstrated skill.  Needs practice.  Needs reinforcement.    TEAM CARE PLAN  Identified problems from team documentation:  Problem: Impaired Cardiac Function  Cardiac: Primary Team Goal: Patient will display vital signs within  limits./Active  Cardiac: Additional Team Goal: Patient and caregiver will teack-back  BEFAST./Active    Problem:  Impaired Cognition  Cognition: Primary Team Goal: Patient will demonstrate adequate attention and  visual / verbal processing to complete ADLs and mild / moderately complex  cognitive tasks for periods of 20 minutes provided min redirections./Active    Problem: Impaired Mobility  Mobility: Primary Team Goal: Pt will perform majority of mobility at a  supervision level with most appropriate assistive device for increased safety  and indepednence upon discharge./Active    Problem: Impaired Psychosocial Skills/Behavior  PsychoSocial: Primary Team Goal: Increase adjustment to changes post-stroke and  his next phase of life/    Problem: Impaired Self-care Mgmt/ADL/IADL  Self Care: Primary Team Goal: Pt will complete self carea at a superivsio/CGA  level provided by family.care giver in order to return home safely./Active    Problem: Impaired Swallowing  Swallowing: Primary Team Goal: Patient will incorporate swallow strategies to  maintain adequate mastication and functional oral clearance in order to safely  tolerate regular texture/thin liquid diet in  100% opportunities mod I./Active    Problem: Safety Risk and Restraint  Safety: Primary Team Goal: Patient will display learned safety  precautions./Active    Please review Integrated Patient View Care Plan Flowsheet for Team identified  Problems, Interventions, and Goals.    Signed by: Forde Dandy, RN 10/19/2019 5:02:00 PM

## 2019-10-19 NOTE — Rehab Progress Note ([facility]) (Signed)
NAMERITHY MANDLEY  MRN: [id number]  Account: [id number]  Session Start: 10/19/2019 8:00:00 AM  Session Stop: 10/19/2019 9:00:00 AM    Total Treatment Minutes: 60.00 Minutes    Speech Language Pathology  Inpatient Rehabilitation Treatment Note    Rehab Diagnosis: revealed stable R thalamus hemorrhagic infarct.  Demographics:            Age: 60Y            Gender: Male  Rehabilitation Precautions/Restrictions:   Fall risk  impulsive, has telesitter    Interventions:       Speech Treatment: SLP intervention targeted processing and initiation, in  order to improve ADL efficiency and reduce caregiver dependence.       Speech Treatment: Significant improved mood. SLP provided priming cues at  the start of each task, re-iterating  goal of decreased SLP cueing for increased  timely initiation. Pt. completed many self care tasks provided consistent SLP  priming - incidental verbal cues to maintain timely initiation and sequencing.  Although self care tasks (toileting, grooming, LB dressing) completed over  extended period of time >40 minutes, verbal cueing necessary to maintain  attention and drive has decreased as compared to start of care. Similar cueing  provided while organizing 15 kitchen items into appropriate location; single  verbal prompt over seven minute time span.  Patient was returned to or left in bed. Handoff to nurse completed.  Bed alarm in place and activated.  Oriented to call bell and placed within reach.  Personal items within reach.  Telesitter in use.    ASSESSMENT  No barrier to SNF discharge on May 20. Change in mood and outlook positively  impacted processing and engagement in therapeutic activity. Slight decreased  cueing necessary to maintain functional  task initiation and drive in ADL  routine.    3 Hour Rule Minutes: 60 minutes of SLP treatment this session count towards  intensity and duration of therapy requirement. Patient was seen for the full  scheduled time of SLP treatment this  session.  Therapy Mode Minutes: Individual: 60 minutes.    Signed by: Jamison Neighbor, M.A., CCC/SLP 10/19/2019 9:00:00 AM

## 2019-10-19 NOTE — Progress Notes (Signed)
PROGRESS NOTE -- FACE-TO-FACE ENCOUNTER  PHYSICAL MEDICINE AND REHABILITATION      Date Time: 10/19/19 6:23 PM  Patient Name: Michael [name], Michael SR.    Admission date:  10/08/2019  Functional Status/Update:   I reviewed patient's therapy notes to assess functional status and ongoing need for therapies.   OT: Patient participates in OT session with focus on L  attention and problem solving utilizing puzzle activity. Patient completes 40%  of puzzle with 75% accuracy, but requires extra time. Patient presents today  with modd alterations and repeatedly asks to go home. He is educated by this  Clinical research associate, CM, and MD that safest discharge at this time is to SNF for continued  Therapies.    PT: Pt resting in bed upon PT arrival, agreeable to  therapy session and cleared by RN; received hand off that pt would like to be  able to go outside.  While outside pt practices multiple trials of ambulating  household distances with Min A without AD.  He practices reaching to the ground  to retrieve items with CGA, and engages in bean bag toss with CG/Min A.  Pt  partway through transferring back to bed following session when he reports the  need to urinate.  He transfers to toilet with SBA using grab bar, and manages  clothing with steadying assist for balance.  After using bathroom pt transfers  back to bed with CGA using arm rest/bed rail for steadying.    SLP: Participant(s) received education on the following topics:  what is stroke, types of stroke, signs /T/ symptoms associated with stroke,  anatomy/physiology of the brain, stroke prevention, stroke recovery, nutrition    Subjective/ROS:   Patient was seen and examined today. He is in better spirits after speaking with his brother and sister.  No acute issues.    Medications:   Medications reviewed by me:     Scheduled Meds: PRN Meds:    amLODIPine, 5 mg, Oral, Daily  hydrALAZINE, 50 mg, Oral, Q8H  senna-docusate, 2 tablet, Oral, Daily  sertraline, 50 mg, Oral, Daily  tamsulosin,  0.4 mg, Oral, Daily after dinner  vitamin D (ergocalciferol), 50,000 Units, Oral, Q Sun (AM)        Continuous Infusions:   acetaminophen, 650 mg, Q6H PRN  bisacodyl, 10 mg, Daily PRN  lactulose, 20 g, Q4H PRN  lidocaine, , PRN  ondansetron, 4 mg, Q8H PRN  traZODone, 50 mg, QHS PRN              Physical Exam:     Vitals:    10/19/19 0933 10/19/19 1119 10/19/19 1421 10/19/19 1653   BP: 137/74 128/86 135/76 (!) 145/92   Pulse: 97 90 (!) 102 94   Resp: 16   20   Temp:    98.1 F (36.7 C)   TempSrc:    Oral   SpO2: 100%   99%   Weight:       Height:         General: NAD, well nourished  HEENT: NC/AT, mouth mucosa pink and moist  Heart: RRR  Lungs:CTAB  Abdomen: Soft, non-tender, +BS  Extremities: no edema, palpable pulses  Neuro: Awake  Psych: Flat Affect    Intake and Output Summary (Last 24 hours) at Date Time    Intake/Output Summary (Last 24 hours) at 10/19/2019 1823  Last data filed at 10/19/2019 1800  Gross per 24 hour   Intake 900 ml   Output 450 ml   Net 450  ml     P.O.: 120 mL (10/19/19 1700)     Urine: 0 mL (10/19/19 1800)  Bladder Scan Volume (mL): 75 mL (10/19/19 1736)  Intermittent/Straight Cath (mL): (Patient refused) (10/11/19 0537)         Labs:   No results for input(s): GLUCOSEWHOLE in the last 24 hours.    Recent Labs   Lab 10/18/19  0557 10/16/19  0654 10/14/19  0503   WBC 6.47 7.37 6.90   Hgb 12.1* 12.9 13.1   Hematocrit 38.5 41.2 41.8   Platelets 434* 442* 432*        Recent Labs   Lab 10/18/19  0557 10/16/19  0654   Sodium 136 137   Potassium 4.3 4.2   Chloride 100 99*   CO2 27 26   BUN 19 19   Creatinine 0.9 0.9   Calcium 9.5 9.9   Albumin  --  4.0   Protein, Total  --  7.6   Bilirubin, Total  --  0.6   Alkaline Phosphatase  --  84   ALT  --  27   AST (SGOT)  --  20   Glucose 88 84             Results     ** No results found for the last 24 hours. **             Rads:   Radiological Procedure reviewed.  Radiology Results (24 Hour)     ** No results found for the last 24 hours. **           Assessment/Plan:     Continue PT, OT in acute rehabilitation medical setting.  Continue comprehensive and intensive inpatient rehab program, including: Physical therapy 60-120 min daily, 5-6 times per week, Occupational therapy 60-120 min daily, 5-6 times per week, Case management and Rehabilitation nursing for the following:    Hemorrhagic stroke.   Patient Active Problem List   Diagnosis    Essential hypertension    Nicotine abuse    Partial tear of right rotator cuff    H/O cardiac radiofrequency ablation    Premature atrial contractions    Premature ventricular contractions    Acute CVA (cerebrovascular accident)    Acute right thalamic hemorrhagic infarct    Hypertensive urgency    Acute metabolic encephalopathy    Cerebrovascular accident (CVA) of right thalamus     #Right thalamic hemorrhage in the setting of hypertensive emergency with deficits in mobility and ADLs: OOB, fall precautions  Keppra for seizure prophylaxis-completed  5/14 CT Head shows resolution of intracranial hemorrhage    #Pain Management: Tylenol PRN    #DVT Prophylaxis: SCDs. No pharmacologics due to intracranial bleed    #Bowel/Bladder Management: PVRs to rule out urinary retention. Use Senna and Colace    #Muscle cramps: Patient states these are chronic for >12 years which he attributes to constantly moving heavy furniture  Flexeril Ballico'd 5/9  CK levels normal    #Skin Care: Keep skin clean and dry    #Comorbities (HTN): Continue Norvasc, Hydralazine    #Psych (hx tobacco dependence-Quit Sep 13, 2019): Psychology services for evaluation and treatment as needed for adjustment/coping   Endorses a lot of stressors including financial and family given his wife has Alzheimer's and he is the bread winner.  5/17 Start Zoloft for depression    #Hyperkeratosis: Podiatry consulted    #FEN/GI (Dysphagia): Mechanical soft diet, SLP to follow    #Dispo: [place] to home  10/21/19  Team conference was held 10/19/19. Patient' status and  progress  discussed at length today with the interdisciplinary team during team conference ;report with detailed status, progress, and goals will be placed in [facility]     Face to face discussion with the patient and coordination of care  took more than  35 minutes; more than 50% of time was spent on coordination of care with interdisciplinary team including physical therapist, occupational therapist, SLP, recreational therapist ,rehabilitation nursing, dietician , neuropsychologist ,and case manager       Medication Review  A complete drug regimen review was completed: Yes  A complete drug regimen review was completed and no drug issues were found.    Discussed plan with patient. All questions were answered.  Signed by: Clover Mealy DO    [facility]

## 2019-10-20 MED ORDER — ACETAMINOPHEN 325 MG PO TABS
650.00 mg | ORAL_TABLET | Freq: Four times a day (QID) | ORAL | Status: AC | PRN
Start: 2019-10-20 — End: ?

## 2019-10-20 MED ORDER — HYDRALAZINE HCL 50 MG PO TABS
50.00 mg | ORAL_TABLET | Freq: Three times a day (TID) | ORAL | Status: AC
Start: 2019-10-20 — End: ?

## 2019-10-20 MED ORDER — BISACODYL 5 MG PO TBEC
10.00 mg | DELAYED_RELEASE_TABLET | Freq: Every day | ORAL | Status: AC | PRN
Start: 2019-10-20 — End: ?

## 2019-10-20 MED ORDER — TAMSULOSIN HCL 0.4 MG PO CAPS
0.40 mg | ORAL_CAPSULE | Freq: Every day | ORAL | Status: AC
Start: 2019-10-21 — End: ?

## 2019-10-20 MED ORDER — ONDANSETRON HCL 4 MG PO TABS
4.00 mg | ORAL_TABLET | Freq: Three times a day (TID) | ORAL | Status: AC | PRN
Start: 2019-10-20 — End: ?

## 2019-10-20 MED ORDER — SERTRALINE HCL 50 MG PO TABS
50.00 mg | ORAL_TABLET | Freq: Every day | ORAL | Status: AC
Start: 2019-10-21 — End: ?

## 2019-10-20 MED ORDER — LACTULOSE 10 GM/15ML PO SOLN
20.00 g | ORAL | Status: AC | PRN
Start: 2019-10-20 — End: ?

## 2019-10-20 MED ORDER — TRAZODONE HCL 50 MG PO TABS
50.00 mg | ORAL_TABLET | Freq: Every evening | ORAL | Status: AC | PRN
Start: 2019-10-20 — End: ?

## 2019-10-20 MED ORDER — VITAMIN D (ERGOCALCIFEROL) 1.25 MG (50000 UT) PO CAPS
50000.00 [IU] | ORAL_CAPSULE | ORAL | Status: AC
Start: 2019-10-24 — End: ?

## 2019-10-20 MED ORDER — SENNOSIDES-DOCUSATE SODIUM 8.6-50 MG PO TABS
2.00 | ORAL_TABLET | Freq: Every day | ORAL | Status: AC
Start: 2019-10-21 — End: ?

## 2019-10-20 MED ORDER — AMLODIPINE BESYLATE 5 MG PO TABS
5.00 mg | ORAL_TABLET | Freq: Every day | ORAL | 3 refills | Status: AC
Start: 2019-10-21 — End: ?

## 2019-10-20 NOTE — Rehab Progress Note ([facility]) (Signed)
Michael White  MRN: [id number]  Account: [id number]  Session Start: 10/20/2019 9:00:00 AM  Session Stop: 10/20/2019 10:00:00 AM    Total Treatment Minutes:  Minutes    Therapeutic Recreation  Inpatient Rehabilitation Progress Note    Rehab Diagnosis: revealed stable R thalamus hemorrhagic infarct.  Demographics:            Age: 35Y            Gender: Male  Rehabilitation Precautions/Restrictions:   Fall risk  impulsive, has telesitter    SUBJECTIVE  Patient Reports: I do feel like I have turned a corner  Pain: Patient currently without complaints of pain.    OBJECTIVE    Interventions: The following treatment was provided:  Avocational Activity-Physical:  Engaged in tossing basketball back and forth.  Taken to outdoor [employer].  Pain Reassessment: Pain was not reassessed as no pain was reported.    Education Provided:  No education provided this session.    ASSESSMENT  Michael White greeted in room, and agreeable to therapy.  Engaged in [place]  activity on unit, awaiting meds.  Cues for initial set up, and set up at nursing  station at the end of the session as well.  Nice participation.  Enjoyed being  outdoors, and tossing basketball back and forth.    PLAN  Therapeutic Recreation services are recommended to address: mobility    Signed by: Idolina Primer, CTRS 10/20/2019 10:00:00 AM

## 2019-10-20 NOTE — Discharge Summary (Signed)
IRF DISCHARGE SUMMARY  PROGRESS NOTE: Patient seen and examined on day of discharge.  Medications reviewed. No new complaints. All questions answered.      Date of admission: 10/08/2019    Date of discharge: 10/21/19    Attending physician: Dr. 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 CT of the head done in emergency room showed acute hemorrhagic infarction in the right  thalamus. He was admitted to the ICU, placed on Cardene drip for hypertensive urgency. Neurosurgery and neurology were consulted. No intervention recommended at this time. Etiology of ICH is suspected to be due to hypertension (highest BP at admission was 201/127) Weaned off Cardene drip. Mental status has improved. Patient will be started on Keppra for 7 days Blood pressure control goal below 140 systolic patient was a started on hydralazine and amlodipine with good blood pressure  Response.  Patient was seen by PT/OT/SLP and deemed appropriate for transfer to [facility] on 10/08/19.     Hospital Course:  #Right thalamic hemorrhage in the setting of hypertensive emergencywith deficits in mobility and ADLs: OOB, fall precautions  Keppra for seizure prophylaxis-completed  5/14 CT Head shows resolution of intracranial hemorrhage    #Pain Management:Tylenol PRN    #DVT Prophylaxis:SCDs. No pharmacologics due to intracranial bleed    #Bowel/Bladder Management: PVRs to rule out urinary retention. Use Senna and Colace    #Muscle cramps: Resolved  Flexeril Brewster'd 5/9  CK levels normal    #Skin Care: Keep  skin clean and dry    #Comorbities (HTN):Continue Norvasc, Hydralazine  -5/19 placed on 24hr Tele due to skipped beats heard on auscultation during exam. Rule out dysrhythmia. Patient asymptomatic    #Psych(hx tobacco dependence-Quit Sep 13, 2019): Psychology services for evaluation and treatment as needed for adjustment/coping  Endorses a lot of stressors including financial and family given his wife has Alzheimer's and he is the bread winner.  5/17 Start Zoloft for depression    #Hyperkeratosis: Podiatry consulted    #FEN/GI(Dysphagia):Upgraded to regular diet, SLP to follow    Physical Exam  Blood pressure 155/81, pulse 85, temperature 97.7 F (36.5 C), temperature source Oral, resp. rate 18, height 1.803 m (5\' 11" ), weight 63.5 kg (139 lb 15.9 oz), SpO2 100 %.    Functional status on Discharge:  PT: Roll Left and Right: 04 - Supervision: Helper provides supervision for safety or  verbal cues ONLY  Assistive Device(s):   Bed rail   increased time needed    Sit to Lying: 04 - Supervision: Helper provides supervision for safety or verbal  cues ONLY  Assistive Device(s):   Bed rail   increased time needed    Lying to Sitting on Side of Bed: 04 - Supervision: Helper provides supervision  for safety or verbal cues ONLY  Assistive Device(s):   Bed rail   increased time needed    Sit to Stand: 04 - Touching Assistance: Helper provides  touching/steadying/contact guard  Assistive Device(s):   None    Chair/Bed-to-Chair Transfer: 04 - Touching Assistance: Helper provides  touching/steadying/contact guard  Assistive Device(s):   None   CG/SBA for stand pivot transfer    Car Transfer: 04 - Touching Assistance: Helper provides  touching/steadying/contact guard    Walk 10 Feet: 04 - Touching Assistance: Helper provides  touching/steadying/contact guard   without AD    Walk 50 Feet with Two Turns: 04 - Touching Assistance: Helper provides  touching/steadying/contact guard   without AD    Walk 150 Feet:  04 -  Touching Assistance: Helper provides  touching/steadying/contact guard   without AD    Walking 10 Feet on Uneven Surface: 03 - Partial/Moderate Assistance: Helper does  1-25% of the activity   without AD    1 Step (curb):  04 - Supervision: Helper provides supervision for safety or  verbal cues ONLY   SBA with rails    4 Steps:  04 - Supervision: Helper provides supervision for safety or verbal  cues ONLY   SBA with rails    12 Steps: 04 - Supervision: Helper provides supervision for safety or verbal  cues ONLY   SBA with rails, pt uses reciprocal gait pattern both ascending and descending.    SLP: SLP interventions targeted orientation, processing and  initiation, in order to reduce burden of care in ADLs. Significant improved mood  and affect. He acknowledged tomorrow d/c, and expressed accurate next level of  care ?[facility] skilled nursing?Marland Kitchen SLP accessed calendar, as external aids, in  order to continue orientation concepts with an errorless learning approach. Pt  demonstrated increased confusion with calendar presentation, and required  min/moderate support to identify simple temporal concepts related to his medical  timeline. With multiple repetitions and guiding gestures, he was able to recall  orientation timeline events with 100% acc. However, later on in session when  speaking with physician he voiced continued confusion and poor understanding of  d/c location. Processing / initiation targeted via card match task, as he  followed three part directions across 10 min span with single verbal prompt for  motor initiation. Sequencing: he completed five step written sequencing provided  minimal verbal guiding cues and benefitted from verbal repetition to aid with  information processing.    OT: Patient requires verbal and tactile cues to  complete toileting tasks  with CGA for balance. He completes functional mobility  to/from bathroom today with hand held assist and verbal cues for big steps. He  completes  sit to stand x 4 today with verbal cues and assist for standing  balance due to posterior lean.    He is also educated on rehab process especially related to next steps after  inpatient rehab.    Discharge location:Home with assistance      Discharge instructions:  Refrain from driving pending further evaluation by physician and/or OT.  Follow-up with primary care physician within 2 weeks.      Rehabilitation services required:  PT/OT/SLP    Rehabilitation equipment required:  None    Discharge medications:     Medication List      START taking these medications    acetaminophen 325 MG tablet  Commonly known as: TYLENOL  Take 2 tablets (650 mg total) by mouth every 6 (six) hours as needed for Pain     bisacodyl 5 MG EC tablet  Commonly known as: DULCOLAX  Take 2 tablets (10 mg total) by mouth daily as needed for Constipation (second line)     lactulose 10 GM/15ML solution  Commonly known as: CHRONULAC  Take 30 mLs (20 g total) by  mouth every 4 (four) hours as needed (constipation, first line)     ondansetron 4 MG tablet  Commonly known as: ZOFRAN  Take 1 tablet (4 mg total) by mouth every 8 (eight) hours as needed for Nausea     senna-docusate 8.6-50 MG per tablet  Commonly known as: PERICOLACE  Take 2 tablets by mouth daily  Start taking on: Oct 21, 2019     sertraline 50 MG tablet  Commonly known as: ZOLOFT  Take 1 tablet (50 mg total) by mouth daily  Start taking on: Oct 21, 2019     tamsulosin 0.4 MG Caps  Commonly known as: FLOMAX  Take 1 capsule (0.4 mg total) by mouth Daily after dinner  Start taking on: Oct 21, 2019     traZODone 50 MG tablet  Commonly known as: DESYREL  Take 1 tablet (50 mg total) by mouth nightly as needed for Sleep     vitamin D (ergocalciferol) 50000 UNIT Caps  Commonly known as: DRISDOL  Take 1 capsule (50,000 Units total) by mouth Once each week on Sunday morning  Start taking on: Oct 24, 2019        CHANGE how you take these medications    hydrALAZINE 50 MG tablet  Commonly known as:  APRESOLINE  Take 1 tablet (50 mg total) by mouth every 8 (eight) hours  What changed: when to take this        CONTINUE taking these medications    amLODIPine 5 MG tablet  Commonly known as: NORVASC  Take 1 tablet (5 mg total) by mouth daily  Start taking on: Oct 21, 2019        STOP taking these medications    levETIRAcetam 750 MG tablet  Commonly known as: KEPPRA           Where to Get Your Medications      Information about where to get these medications is not yet available    Ask your nurse or doctor about these medications   acetaminophen 325 MG tablet   amLODIPine 5 MG tablet   bisacodyl 5 MG EC tablet   hydrALAZINE 50 MG tablet   lactulose 10 GM/15ML solution   ondansetron 4 MG tablet   senna-docusate 8.6-50 MG per tablet   sertraline 50 MG tablet   tamsulosin 0.4 MG Caps   traZODone 50 MG tablet   vitamin D (ergocalciferol) 50000 UNIT Caps         Medication Review  A complete drug regimen review was completed: Yes   No drug issues were found.    Diet: Regular, heart healthy    Recent labs/imaging  Lab Results   Component Value Date    WBC 6.47 10/18/2019    HGB 12.1 (L) 10/18/2019    HCT 38.5 10/18/2019    MCV 67.2 (L) 10/18/2019    PLT 434 (H) 10/18/2019     Lab Results   Component Value Date    BUN 19 10/18/2019     Lab Results   Component Value Date    CREAT 0.9 10/18/2019     Lab Results   Component Value Date    NA 136 10/18/2019    K 4.3 10/18/2019    CL 100 10/18/2019    CO2 27 10/18/2019     Lab Results   Component Value Date    ALT 27 10/16/2019    AST 20 10/16/2019    ALKPHOS 84 10/16/2019    BILITOTAL 0.6 10/16/2019  Lab Results   Component Value Date    INR 1.2 (H) 10/06/2019    INR 1.2 (H) 10/05/2019    INR 1.2 (H) 10/04/2019    PT 14.1 (H) 10/06/2019    PT 14.2 (H) 10/05/2019    PT 13.8 (H) 10/04/2019       CT Head WO Contrast    Result Date: 10/15/2019   Essentially, complete resolution of intracranial hemorrhages. Georgana Curio, MD  10/15/2019 2:00 PM        Follow Ups: As  arranged by case management    CC: Pcp, None, MD (None)

## 2019-10-20 NOTE — Rehab Progress Note ([facility]) (Signed)
NAMEDIO GILLER  MRN: [id number]  Account: [id number]  Session Start: 10/19/2019 1:00:00 PM  Session Stop: 10/19/2019 2:00:00 PM    Total Treatment Minutes: 60.00 Minutes    Occupational Therapy  Inpatient Rehabilitation Treatment Note    Rehab Diagnosis: revealed stable R thalamus hemorrhagic infarct.  Demographics:            Age: 63Y            Gender: Male  Rehabilitation Precautions/Restrictions:   Fall risk    Vital Signs:                       Before Activity              After Activity  Vitals  BP Systolic          128                          -  BP Diastolic         86                           -  Pulse                90                           -    Interventions:       Therapeutic Activities:  Patient requires verbal and tactile cues to  complete toileting tasks  with CGA for balance. He completes functional mobility  to/from bathroom today with hand held assist and verbal cues for big steps. He  completes sit to stand x 4 today with verbal cues and assist for standing  balance due to posterior lean.    He is also educated on rehab process especially related to next steps after  inpatient rehab.  Patient was left seated in chair at nurse's station. Handoff to nurse completed.  Chair alarm in place and activated.  Personal items within reach.    ASSESSMENT  Benefits from mass practice of all functional tasks to maximize return and  decrease caregiver burden.    3 Hour Rule Minutes: 60 minutes of OT treatment this session count towards  intensity and duration of therapy requirement. Patient was seen for the full  scheduled time of OT treatment this session.  Therapy Mode Minutes: Individual: 60 minutes.    Signed by: Elsie Amis, OTR/L 10/19/2019 2:00:00 PM

## 2019-10-20 NOTE — Rehab Discharge Summary ([facility]) (Signed)
[name]  MRN: [id number]  Account: [id number]  Session Start: 10/20/2019 12:00:00 AM  Session Stop: 10/20/2019 12:00:00 AM    Total Treatment Minutes:  Minutes    Inpatient Rehabilitation  Rehabilitation Nursing Discharge Summary    Rehab Diagnosis: revealed stable R thalamus hemorrhagic infarct.  Demographics:            Age: 28Y            Gender: Male  Primary Language: English    Date of Onset:  10/04/19  Date of Admission: 10/08/2019 4:57:00 PM    Rehabilitation Precautions Restrictions:   Fall risk  impulsive, has telesitter    Discharge:  Patient discharged to:   Skilled Nursing Facility    Patient Report: "I'm doing fine"  Patient/Caregiver Goals:  "I want to walk so I can go home to my wife."    Wounds/Incisions: No wounds or incisions.    Medication Review: No clinically significant medication issues identified this  shift.    Additional Education Provided:    Education Provided: Precautions. Safety. Skin/wound care. Critical pressure  areas. Prevention of skin breakdown. Stroke. What is stroke, Types of stroke,  Signs /T/ Symptoms associated with stroke, Stroke prevention, Healthy diet,  Increase physical activity, Reduce stress, Stroke recovery       Audience: Patient.       Mode: Explanation.       Response: Verbalized understanding.    PLAN  Recommendations for Follow-Up Care:   Yes, patient received valuables.  Bladder Program: Continent  Bowel Program:  Last Bowel Movement- 10/18/2019   Continent  Skin:  Current Diet: Regular diet, 2gm Na restrictions  Pain Management: Medication. Imagery. Position changes. Relaxation.    Care Plan  Identified problems from team documentation:  Problem: Impaired Cardiac Function  Cardiac: Primary Team Goal: Patient will display vital signs within  limits./Active  Cardiac: Additional Team Goal: Patient and caregiver will teack-back  BEFAST./Active    Problem: Impaired Cognition  Cognition: Primary Team Goal: Patient will demonstrate adequate attention and  visual  / verbal processing to complete ADLs and mild / moderately complex  cognitive tasks for periods of 20 minutes provided min redirections./Met    Problem: Impaired Mobility  Mobility: Primary Team Goal: Pt will perform majority of mobility at a  supervision level with most appropriate assistive device for increased safety  and indepednence upon discharge./Not Met    Problem: Impaired Psychosocial Skills/Behavior  PsychoSocial: Primary Team Goal: Increase adjustment to changes post-stroke and  his next phase of life/    Problem: Impaired Self-care Mgmt/ADL/IADL  Self Care: Primary Team Goal: Pt will complete self carea at a superivsio/CGA  level provided by family.care giver in order to return home safely./Active    Problem: Impaired Swallowing  Swallowing: Primary Team Goal: Patient will incorporate swallow strategies to  maintain adequate mastication and functional oral clearance in order to safely  tolerate regular texture/thin liquid diet in 100% opportunities mod I./Met    Problem: Safety Risk and Restraint  Safety: Primary Team Goal: Patient will display learned safety  precautions./Active    Status update for discharge:      Please review Integrated Patient View Care Plan Flowsheet for Team identified  Problems, Interventions, and Goals.    Signed by: Mariel Kansky, RN 10/20/2019 10:30:00 PM

## 2019-10-20 NOTE — Consults (Signed)
Chaplain Service      Assessment:  Spiritual Assessment: Realistic outlook, Positive outlook, Adjusting to diagnosis, Coping with illness/hospitalization    Background:  Visit Type: Initial was made by Chaplain with patient, Michael Haggard Sr., based on Source: Nurse Referral([facility] request. ).  Present at Visit: patient, Technician(PT tech. ).  Spiritual Care Provided to: patient only.  Length of Visit: 0-15 minutes .    Summary:  Reason for Request: Emotional Support, Spiritual Support   Spiritual Care Interventions: Normalized experience of patient/family, Provided emotional support, Provided comfort, encouragement, affirmation, Provided prayer   Spiritual Care Outcomes: Patient expressed appreciation of visit, Patient appeared to have appreciated visit, Consulted with interdisciplinary team

## 2019-10-20 NOTE — Rehab Discharge Summary ([facility]) (Addendum)
Corrected 10/20/2019 4:56:28 PM    NAME: Michael White  MRN: [id number]  Account: [id number]  Session Start: 10/20/2019 2:00:00 PM  Session Stop: 10/20/2019 3:00:00 PM    Total Treatment Minutes: 60.00 Minutes    Physical Therapy  Inpatient Rehabilitation Discharge Summary and Note    Rehab Diagnosis: revealed stable R thalamus hemorrhagic infarct.  Demographics:            Age: 64Y            Gender: Male  Primary Language: English  Past Medical History: Past Medical History:  ? 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  ? Borderline hypertension    on meds in past; BP normal without meds recently  Past Surgical History:  ? CARDIAC ABLATION   04/15/2013    ablation of slow pathway for AVNRT  History of Present Illness: 64 y.o. male with past medical history significant  of hypertension who has not seen any physician for many years what brought into  emergency room by paramedics when he was found confused by his coworker.  CT of  the head done in emergency room showed acute hemorrhagic infarction in the right  thalamus.  At present patient is awake alert oriented to self however has been  intermittently confused.  He was admitted to the ICU, placed on Cardene drip for hypertensive urgency.  Neurosurgery and neurology were consulted. Etiology of ICH is suspected to be  due to hypertension. Neurosurgery recommends no indication for neurosurgical  intervention.  Weaned off Cardene drip.  Mental status has improved. Patient  reports generalized pain otherwise no specific complaints. Patient will be  started on Keppra for 7 days Blood pressure control goal below 140 systolic  patient was a started on hydralazine and amlodipine with good blood pressure  response.  patient is A/T/O X3 and following commands, his last BM was 5/4, he is passing  gas. Patient is tolerating a mechanical soft diet, he is working with PT, OT and  SLP, he had  an external foley. Patient would benefit from inpatient AR before  going home with his family. Covid as negative 5/3.   Date of Onset: 10/04/19   Date of Admission: 10/08/2019 4:57:00 PM  Rehabilitation Precautions/Restrictions:   Fall risk  impulsive, has telesitter    SUBJECTIVE  Patient Report: "I'll miss you girls, but I'm ready to take the next step toward  getting home."  Pain: Patient currently without complaints of pain.    OBJECTIVE  Vital Signs:                       Before Activity              After Activity  Vitals  BP Systolic          -                            156  BP Diastolic         -                            84  Pulse                -  67    CARE Tool Discharge Performance  Mobility Functional Assessment  Roll Left and Right: 04 - Supervision: Helper provides supervision for safety or  verbal cues ONLY  Assistive Device(s):   Bed rail   increased time needed    Sit to Lying: 04 - Supervision: Helper provides supervision for safety or verbal  cues ONLY  Assistive Device(s):   Bed rail   increased time needed    Lying to Sitting on Side of Bed: 04 - Supervision: Helper provides supervision  for safety or verbal cues ONLY  Assistive Device(s):   Bed rail   increased time needed    Sit to Stand: 04 - Touching Assistance: Helper provides  touching/steadying/contact guard  Assistive Device(s):   None    Chair/Bed-to-Chair Transfer: 04 - Touching Assistance: Helper provides  touching/steadying/contact guard  Assistive Device(s):   None   CG/SBA for stand pivot transfer    Car Transfer: 04 - Touching Assistance: Helper provides  touching/steadying/contact guard    Walk 10 Feet: 04 - Touching Assistance: Helper provides  touching/steadying/contact guard   without AD    Walk 50 Feet with Two Turns: 04 - Touching Assistance: Helper provides  touching/steadying/contact guard   without AD    Walk 150 Feet:  04 - Touching Assistance: Helper provides  touching/steadying/contact guard    without AD    Walking 10 Feet on Uneven Surface: 03 - Partial/Moderate Assistance: Helper does  1-25% of the activity   without AD    1 Step (curb):  04 - Supervision: Helper provides supervision for safety or  verbal cues ONLY   SBA with rails    4 Steps:  04 - Supervision: Helper provides supervision for safety or verbal  cues ONLY   SBA with rails    12 Steps: 04 - Supervision: Helper provides supervision for safety or verbal  cues ONLY   SBA with rails, pt uses reciprocal gait pattern both ascending and descending.    Wheelchair: Patient does not use a wheelchair or scooter.    Today's Treatment Interventions:       Therapeutic Activities:  Pt resting in bed upon PT arrival, agreeable to  therapy session and cleared by RN.  He is able to transfer supine>sitting  EOB>w/c all with SBA.  Pt asks to use the bathroom prior to leaving his room,  transferring w/c to/from toilet with SBA using grab bars and managing pants with  PT providing CGA for balance.  He practices gait training 150+ ft with CG/SBA  without AD, and 42' with SBA without AD, each time needing cueing to take larger  steps.  Pt negotiates 12 consecutive (6") stairs with SBA using B rails.  He  practices stepping over small hurdles with Min A without AD.  Patient was left seated in chair in his/her room. Handoff to nurse completed.  Chair alarm in place and activated.  Oriented to call bell and placed within reach.  Personal items within reach.      Lower Extremity Orthosis: Patient does not present with foot drop or ankle  instability and does not need an orthotic.  Home Exercise Program: The patient was not provided with an individualized home  exercise program.  Equipment Provided/Ordered: None at this time. TBD by next facility.    ASSESSMENT  Summary of Progress and Current Status: Michael White has made good progress during his  stay in acute rehab.  He demos significant improvements in initiation, but is  still limited by bradykinesia  at times.  His  fear of falling is improving, and  today is able to stand and ambulate without AD without retropulsion for the  first time.  He will benefit from continued therapies at the sub-acute level to  continue to maximize his independence, as he has limited support at home.      Progress Toward Goals (final status):   LONG TERM GOAL REVIEW:       1. Pt will be independent with bed mobility on a flat bed without bed rails  for increased safety and independence upon discharge. - Not Met: supervision and  use of bed rails for safety       2. Pt will perform stand pivot transfers with most appropriate assistive  device with supervision for increased safety and independence upon discharge. -  Not Met Pt typically completes with CGA but is intermittantly SBA       3. Pt will ambulate 150 supervision with most appropriate assistive device  for increased safety and independence upon discharge. - Not Met CGA for  ambulation without AD       4. Pt will ascend/descend 12 stairs with one rail supervision for increased  safety and independence upon discharge. - Not Met: SBA to negotiate 12 stairs  with B rails       5. Pt and his family will participate in education/training to allow for a  safe discharge. - Not Met: Pt to transition to sub-acute facility    PLAN  Physical Therapy Plan: Patient is recommended for sub-acute therapy services.    Team Care Plan  Please review Integrated Patient View Care Plan Flowsheet for Team identified  Problems, Interventions, and Goals.    Identified problems from team documentation:  Problem: Impaired Cardiac Function  Cardiac: Primary Team Goal: Patient will display vital signs within  limits./Active  Cardiac: Additional Team Goal: Patient and caregiver will teach-back  BEFAST./Active    Problem: Impaired Cognition  Cognition: Primary Team Goal: Patient will demonstrate adequate attention and  visual / verbal processing to complete ADLs and mild / moderately complex  cognitive tasks for periods of  20 minutes provided min redirections./Active    Problem: Impaired Mobility  Mobility: Primary Team Goal: Pt will perform majority of mobility at a  supervision level with most appropriate assistive device for increased safety  and independence upon discharge./Active    Problem: Impaired Psychosocial Skills/Behavior  PsychoSocial: Primary Team Goal: Increase adjustment to changes post-stroke and  his next phase of life/    Problem: Impaired Self-care Mgmt/ADL/IADL  Self Care: Primary Team Goal: Pt will complete self care at a supervision/CGA  level provided by family.care giver in order to return home safely./Active    Problem: Impaired Swallowing  Swallowing: Primary Team Goal: Patient will incorporate swallow strategies to  maintain adequate mastication and functional oral clearance in order to safely  tolerate regular texture/thin liquid diet in 100% opportunities mod I./Active    Problem: Safety Risk and Restraint  Safety: Primary Team Goal: Patient will display learned safety  precautions./Active    Status update for discharge:     Mobility:   Primary Goal: Not Met  Discharge Status Comment:   Supervision bed mobility, SBA transfers to/from w/c,  CGA to ambulate 150+ ft without AD, SBA to negotiate a full FOS with rails    3 Hour Rule Minutes: 60 minutes of PT treatment this session count towards  intensity and duration of therapy requirement. Patient was seen for the full  scheduled  time of PT treatment this session.  Therapy Mode Minutes: Individual: 60 minutes.    Signed by: Jaquita Rector, PT 10/20/2019 3:00:00 PM

## 2019-10-20 NOTE — Rehab Discharge Summary ([facility]) (Signed)
Michael White  MRN: [id number]  Account: [id number]  Session Start: 10/20/2019 11:00:00 AM  Session Stop: 10/20/2019 12:00:00 PM    Total Treatment Minutes: 60.00 Minutes    Speech Language Pathology  Inpatient Rehabilitation  Language Cognitive and Dysphagia Discharge Summary and Note    Rehab Diagnosis: revealed stable R thalamus hemorrhagic infarct.  Demographics:            Age: 64Y            Gender: Male  Primary Language: English    Past Medical History: Past Medical History:  ? 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  ? Borderline hypertension    on meds in past; BP normal without meds recently  Past Surgical History:  ? CARDIAC ABLATION   04/15/2013    ablation of slow pathway for AVNRT  History of Present Illness: 64 y.o. male with past medical history significant  of hypertension who has not seen any physician for many years what brought into  emergency room by paramedics when he was found confused by his coworker.  CT of  the head done in emergency room showed acute hemorrhagic infarction in the right  thalamus.  At present patient is awake alert oriented to self however has been  intermittently confused.  He was admitted to the ICU, placed on Cardene drip for hypertensive urgency.  Neurosurgery and neurology were consulted. Etiology of ICH is suspected to be  due to hypertension. Neurosurgery recommends no indication for neurosurgical  intervention.  Weaned off Cardene drip.  Mental status has improved. Patient  reports generalized pain otherwise no specific complaints. Patient will be  started on Keppra for 7 days Blood pressure control goal below 140 systolic  patient was a started on hydralazine and amlodipine with good blood pressure  response.  patient is A/T/O X3 and following commands, his last BM was 5/4, he is passing  gas. Patient is tolerating a mechanical soft diet, he is working with PT, OT  and  SLP, he had an external foley. Patient would benefit from inpatient AR before  going home with his family. Covid as negative 5/3.   Date of Onset: 10/04/19   Date of Admission: 10/08/2019 4:57:00 PM  Premorbid Functional Level: Independent with ADLs, IADLs + driving. 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  Per OT report, full bathroom is up a flight of stairs.   Pt is partially the caregiver for his spouse who has (per patient) dementia.  Spouse's daughter is the primary caregiver, and is also the mother of six  children ages 41-14.  Patient lives with his spouse, her daughter, son-in-law and  their six children.  Per OT pt has an adult son in [place].  Unknown if there are steps to enter the home.    Rehabilitation Precautions/Restrictions:   Fall risk  impulsive, has telesitter    SUBJECTIVE  Patient Report: Agreeable to ST this morning.  Pain: Patient currently without complaints of pain.      OBJECTIVE    Current Diet: Regular solids, thin liquids    Today's Treatment Interventions:       Speech Treatment: SLP interventions targeted orientation, processing and  initiation, in  order to reduce burden of care in ADLs. Significant improved mood  and affect. He acknowledged tomorrow d/c, and expressed accurate next level of  care ?[facility] skilled nursing?Marland Kitchen SLP accessed calendar, as external aids, in  order to continue orientation concepts with an errorless learning approach. Pt  demonstrated increased confusion with calendar presentation, and required  min/moderate support to identify simple temporal concepts related to his medical  timeline. With multiple repetitions and guiding gestures, he was able to recall  orientation timeline events with 100% acc. However, later on in session when  speaking with physician he voiced continued  confusion and poor understanding of  d/c location. Processing / initiation targeted via card match task, as he  followed three part directions across 10 min span with single verbal prompt for  motor initiation. Sequencing: he completed five step written sequencing provided  minimal verbal guiding cues and benefitted from verbal repetition to aid with  information processing.  Patient was left seated in chair at nurse's station. Handoff to nurse completed.  Chair alarm in place and activated.  Personal items within reach.    ASSESSMENT  Summary of Progress and Current Status: Skilled SLP interventions has addressed  cognitive domains, specifically attention, processing and initiation, as well as  swallow function. He successfully transitioned to a regular texture diet, and is  currently tolerating without concern for aspiration / penetration. Pt. has  demonstrated a mild improvement within task initiation, ADL sequencing and task  drive. He continues to require minimal verbal prompts in order to efficiently  sequence familiar, concrete tasks, and benefitted from SLP provided multiple  verbal repetitions and incorporating teach/back opportunities to aid with  processing. [name] will continue to benefit from skilled speech therapy  intervention within a subacute care setting prior to d/c home.    Progress Toward Goals (final status):   SHORT TERM GOAL REVIEW:       1. Patient will demonstrate improved initiation and processing by following  2-3 step directions given moderate cues to increase safety upon d/c. - Met       2. Patient will demonstrate adequate left visual attention to identify  items in his proximal, mildly distracting environment 80% of the time provided  verbal prompts as needed. - Not Met: Minimal carryover of L attention  strategies, although overall has dynamic L visual attention has improved.       3. Patient will implement safe swallow techniques, specifically reduced  intake pace, alternate  liquids/solids within regular meal trial provided  supervision as needed. - Met   LONG TERM GOAL REVIEW:       1. Patient will incorporate swallow strategies to maintain adequate  mastication and functional oral clearance in order to safely tolerate regular  texture/thin liquid diet in 100% opportunities mod I. - Met       2. Patient will demonstrate adequate attention and visual / verbal  processing to complete ADLs  of 10 minutes provided min/moderate verbal  prompting/cues. - Met       3. Pt. will demonstrate use of working memory strategies (verbal rehearsal,  repetition, clarification) in order to acquire and retain task instructions and  strategies in environmental navigation and simple visuo-spatial activities with  min A. - Not Met: Primarily focused on attention and initiation to improve  cognitive foundation    PLAN  Speech Pathology Plan: Patient is recommended for sub-acute therapy services.    Team Care Plan  Please review Integrated Patient View Care Plan Flowsheet  for Team identified  Problems, Interventions, and Goals.    Identified problems from team documentation:  Problem: Impaired Cardiac Function  Cardiac: Primary Team Goal: Patient will display vital signs within  limits./Active  Cardiac: Additional Team Goal: Patient and caregiver will teack-back  BEFAST./Active    Problem: Impaired Cognition  Cognition: Primary Team Goal: Patient will demonstrate adequate attention and  visual / verbal processing to complete ADLs and mild / moderately complex  cognitive tasks for periods of 20 minutes provided min redirections./Active    Problem: Impaired Mobility  Mobility: Primary Team Goal: Pt will perform majority of mobility at a  supervision level with most appropriate assistive device for increased safety  and indepednence upon discharge./Active    Problem: Impaired Psychosocial Skills/Behavior  PsychoSocial: Primary Team Goal: Increase adjustment to changes post-stroke and  his next phase of  life/    Problem: Impaired Self-care Mgmt/ADL/IADL  Self Care: Primary Team Goal: Pt will complete self carea at a superivsio/CGA  level provided by family.care giver in order to return home safely./Active    Problem: Impaired Swallowing  Swallowing: Primary Team Goal: Patient will incorporate swallow strategies to  maintain adequate mastication and functional oral clearance in order to safely  tolerate regular texture/thin liquid diet in 100% opportunities mod I./Active    Problem: Safety Risk and Restraint  Safety: Primary Team Goal: Patient will display learned safety  precautions./Active    Status update for discharge:     Cognition:   Primary Goal: Met  Discharge Status Comment:   Patient attends to ADL routine over extensive amount  of time, however requiring average of min/moderate verbal cues to process  incoming information and initiate tasks.     Swallow Function:   Primary Goal: Met  Discharge Status Comment:   Pt is safely tolerating a regular texture/thin  liquid diet.    3 Hour Rule Minutes: 60 minutes of SLP treatment this session count towards  intensity and duration of therapy requirement. Patient was seen for the full  scheduled time of SLP treatment this session.  Therapy Mode Minutes: Individual: 60 minutes.    Signed by: Jamison Neighbor, M.A., CCC/SLP 10/20/2019 12:00:00 PM

## 2019-10-20 NOTE — Progress Notes (Signed)
PROGRESS NOTE -- FACE-TO-FACE ENCOUNTER  PHYSICAL MEDICINE AND REHABILITATION      Date Time: 10/20/19 3:18 PM  Patient Name: Michael White, Michael SR.    Admission date:  10/08/2019  Functional Status/Update:   I reviewed patient's therapy notes to assess functional status and ongoing need for therapies.   OT: Patient participates in OT session with focus on L  attention and problem solving utilizing puzzle activity. Patient completes 40%  of puzzle with 75% accuracy, but requires extra time. Patient presents today  with modd alterations and repeatedly asks to go home. He is educated by this  Clinical research associate, CM, and MD that safest discharge at this time is to SNF for continued  Therapies.    PT: Pt resting in bed upon PT arrival, agreeable to  therapy session and cleared by RN; received hand off that pt would like to be  able to go outside.  While outside pt practices multiple trials of ambulating  household distances with Min A without AD.  He practices reaching to the ground  to retrieve items with CGA, and engages in bean bag toss with CG/Min A.  Pt  partway through transferring back to bed following session when he reports the  need to urinate.  He transfers to toilet with SBA using grab bar, and manages  clothing with steadying assist for balance.  After using bathroom pt transfers  back to bed with CGA using arm rest/bed rail for steadying.    SLP: Participant(s) received education on the following topics:  what is stroke, types of stroke, signs /T/ symptoms associated with stroke,  anatomy/physiology of the brain, stroke prevention, stroke recovery, nutrition    Subjective/ROS:   Patient was seen and examined today. He is participating in SLP.  Speech therapist and CM present for encounter.  Patient want sto ensure he will not be permanently in a SNF.  He denies palpitations    Medications:   Medications reviewed by me:     Scheduled Meds: PRN Meds:    amLODIPine, 5 mg, Oral, Daily  hydrALAZINE, 50 mg, Oral,  Q8H  senna-docusate, 2 tablet, Oral, Daily  sertraline, 50 mg, Oral, Daily  tamsulosin, 0.4 mg, Oral, Daily after dinner  vitamin D (ergocalciferol), 50,000 Units, Oral, Q Sun (AM)        Continuous Infusions:   acetaminophen, 650 mg, Q6H PRN  bisacodyl, 10 mg, Daily PRN  lactulose, 20 g, Q4H PRN  lidocaine, , PRN  ondansetron, 4 mg, Q8H PRN  traZODone, 50 mg, QHS PRN              Physical Exam:     Vitals:    10/20/19 0612 10/20/19 0820 10/20/19 0924 10/20/19 1454   BP: 139/70 138/75 122/73 156/84   Pulse: 85 (!) 102 60 67   Resp: 15  16    Temp: 98.4 F (36.9 C)      TempSrc: Oral      SpO2: 100%  100%    Weight:       Height:         General: NAD, well nourished  HEENT: NC/AT, mouth mucosa pink and moist  Heart: skipped beats noted  Lungs:CTAB  Abdomen: Soft, non-tender, +BS  Extremities: no edema, palpable pulses  Neuro: Awake  Psych: Flat Affect    Intake and Output Summary (Last 24 hours) at Date Time    Intake/Output Summary (Last 24 hours) at 10/20/2019 1518  Last data filed at 10/20/2019 1500  Gross per 24  hour   Intake 800 ml   Output 700 ml   Net 100 ml     P.O.: 260 mL (10/20/19 1500)     Urine: 0 mL (10/20/19 0800)  Bladder Scan Volume (mL): 153 mL (10/20/19 0612)  Intermittent/Straight Cath (mL): (Patient refused) (10/11/19 0537)         Labs:   No results for input(s): GLUCOSEWHOLE in the last 24 hours.    Recent Labs   Lab 10/18/19  0557 10/16/19  0654 10/14/19  0503   WBC 6.47 7.37 6.90   Hgb 12.1* 12.9 13.1   Hematocrit 38.5 41.2 41.8   Platelets 434* 442* 432*        Recent Labs   Lab 10/18/19  0557 10/16/19  0654   Sodium 136 137   Potassium 4.3 4.2   Chloride 100 99*   CO2 27 26   BUN 19 19   Creatinine 0.9 0.9   Calcium 9.5 9.9   Albumin  --  4.0   Protein, Total  --  7.6   Bilirubin, Total  --  0.6   Alkaline Phosphatase  --  84   ALT  --  27   AST (SGOT)  --  20   Glucose 88 84             Results     ** No results found for the last 24 hours. **             Rads:   Radiological Procedure  reviewed.  Radiology Results (24 Hour)     ** No results found for the last 24 hours. **          Assessment/Plan:     Continue PT, OT in acute rehabilitation medical setting.  Continue comprehensive and intensive inpatient rehab program, including: Physical therapy 60-120 min daily, 5-6 times per week, Occupational therapy 60-120 min daily, 5-6 times per week, Case management and Rehabilitation nursing for the following:    Hemorrhagic stroke.   Patient Active Problem List   Diagnosis    Essential hypertension    Nicotine abuse    Partial tear of right rotator cuff    H/O cardiac radiofrequency ablation    Premature atrial contractions    Premature ventricular contractions    Acute CVA (cerebrovascular accident)    Acute right thalamic hemorrhagic infarct    Hypertensive urgency    Acute metabolic encephalopathy    Cerebrovascular accident (CVA) of right thalamus     #Right thalamic hemorrhage in the setting of hypertensive emergency with deficits in mobility and ADLs: OOB, fall precautions  Keppra for seizure prophylaxis-completed  5/14 CT Head shows resolution of intracranial hemorrhage    #Pain Management: Tylenol PRN    #DVT Prophylaxis: SCDs. No pharmacologics due to intracranial bleed    #Bowel/Bladder Management: PVRs to rule out urinary retention. Use Senna and Colace    #Muscle cramps: Patient states these are chronic for >12 years which he attributes to constantly moving heavy furniture  Flexeril Nambe'd 5/9  CK levels normal    #Skin Care: Keep skin clean and dry    #Comorbities (HTN): Continue Norvasc, Hydralazine  -5/19 placed on 24hr Tele de to skipped beats heard on auscultation during exam. Rule out dysrhythmia. Patient asymptomatic    #Psych (hx tobacco dependence-Quit Sep 13, 2019): Psychology services for evaluation and treatment as needed for adjustment/coping   Endorses a lot of stressors including financial and family given his wife has Alzheimer's and  he is the bread  winner.  5/17 Start Zoloft for depression    #Hyperkeratosis: Podiatry consulted    #FEN/GI (Dysphagia): Mechanical soft diet, SLP to follow    #Dispo: [place] to SNF 10/21/19  Team conference was held 10/19/19. Patient' status and progress  discussed at length today with the interdisciplinary team during team conference ;report with detailed status, progress, and goals will be placed in Medilinks     Face to face discussion with the patient and coordination of care  took more than  35 minutes; more than 50% of time was spent on coordination of care with interdisciplinary team including physical therapist, occupational therapist, SLP, recreational therapist ,rehabilitation nursing, dietician , neuropsychologist ,and case manager       Medication Review  A complete drug regimen review was completed: Yes  A complete drug regimen review was completed and no drug issues were found.    Discussed plan with patient. All questions were answered.  Signed by: Clover Mealy DO    [facility]

## 2019-10-20 NOTE — Rehab Progress Note (Medilinks) (Signed)
Michael White  MRN: [id number]  Account: [id number]  Session Start: 10/20/2019 12:00:00 AM  Session Stop: 10/20/2019 12:00:00 AM    Total Treatment Minutes:  Minutes    Rehabilitation Nursing  Inpatient Rehabilitation Shift Assessment    Rehab Diagnosis: revealed stable R thalamus hemorrhagic infarct.  Demographics:            Age: 18Y            Gender: Male  Primary Language: English    Date of Onset:  10/04/19  Date of Admission: 10/08/2019 4:57:00 PM    Rehabilitation Precautions Restrictions:   Fall risk  impulsive, has telesitter    Patient Report: Good morning, I remeber you  Patient/Caregiver Goals:  "I want to walk so I can go home to my wife."    Wounds/Incisions: No wounds or incisions.    Medication Review: No clinically significant medication issues identified this  shift.    Bowel and Bladder Output:                       Bladder (# only)             Bowel (# only)  Number of Episodes  Continent            2                            0  Incontinent          0                            0    Additional Education Provided:    Education Provided: Precautions. Fall prevention/balance training. Cognitive  functioning. Stroke. Signs /T/ Symptoms associated with stroke, Stroke  prevention, Medication compliance, Control blood pressure, high cholesterol  and/or diabetes, Increase physical activity, Stroke recovery, Emotional  adjustments/depression, Thrombophlebitis (DVT), Pressure ulcer, Pulmonary  complications, Prevention of contractures/subluxation, Supervision/assistance  requirements Activities of daily living. Cardiac. Monitoring blood pressure.  Monitoring heart rate.       Audience: Patient.       Mode: Explanation.  Demonstration.       Response: Applied knowledge.  Verbalized understanding.  Demonstrated skill.  Needs reinforcement.    TEAM CARE PLAN  Identified problems from team documentation:  Problem: Impaired Cardiac Function  Cardiac: Primary Team Goal: Patient will display vital signs  within  limits./Active  Cardiac: Additional Team Goal: Patient and caregiver will teack-back  BEFAST./Active    Problem: Impaired Cognition  Cognition: Primary Team Goal: Patient will demonstrate adequate attention and  visual / verbal processing to complete ADLs and mild / moderately complex  cognitive tasks for periods of 20 minutes provided min redirections./Met    Problem: Impaired Mobility  Mobility: Primary Team Goal: Pt will perform majority of mobility at a  supervision level with most appropriate assistive device for increased safety  and indepednence upon discharge./Not Met    Problem: Impaired Psychosocial Skills/Behavior  PsychoSocial: Primary Team Goal: Increase adjustment to changes post-stroke and  his next phase of life/    Problem: Impaired Self-care Mgmt/ADL/IADL  Self Care: Primary Team Goal: Pt will complete self carea at a superivsio/CGA  level provided by family.care giver in order to return home safely./Active    Problem: Impaired Swallowing  Swallowing: Primary Team Goal: Patient will incorporate swallow strategies to  maintain adequate mastication and functional oral  clearance in order to safely  tolerate regular texture/thin liquid diet in 100% opportunities mod I./Met    Problem: Safety Risk and Restraint  Safety: Primary Team Goal: Patient will display learned safety  precautions./Active    Please review Integrated Patient View Care Plan Flowsheet for Team identified  Problems, Interventions, and Goals.    Signed by: Forde Dandy, RN 10/20/2019 7:18:00 PM

## 2019-10-21 LAB — CBC
Absolute NRBC: 0 10*3/uL (ref 0.00–0.00)
Hematocrit: 37.3 % — ABNORMAL LOW (ref 37.6–49.6)
Hgb: 11.6 g/dL — ABNORMAL LOW (ref 12.5–17.1)
MCH: 20.9 pg — ABNORMAL LOW (ref 25.1–33.5)
MCHC: 31.1 g/dL — ABNORMAL LOW (ref 31.5–35.8)
MCV: 67.1 fL — ABNORMAL LOW (ref 78.0–96.0)
MPV: 8.9 fL (ref 8.9–12.5)
Nucleated RBC: 0 /100 WBC (ref 0.0–0.0)
Platelets: 379 10*3/uL — ABNORMAL HIGH (ref 142–346)
RBC: 5.56 10*6/uL (ref 4.20–5.90)
RDW: 21 % — ABNORMAL HIGH (ref 11–15)
WBC: 5.6 10*3/uL (ref 3.10–9.50)

## 2019-10-21 LAB — BASIC METABOLIC PANEL
Anion Gap: 9 (ref 5.0–15.0)
BUN: 17 mg/dL (ref 9–28)
CO2: 27 mEq/L (ref 22–29)
Calcium: 9.2 mg/dL (ref 8.5–10.5)
Chloride: 100 mEq/L (ref 100–111)
Creatinine: 0.9 mg/dL (ref 0.7–1.3)
Glucose: 89 mg/dL (ref 70–100)
Potassium: 4.5 mEq/L (ref 3.5–5.1)
Sodium: 136 mEq/L (ref 136–145)

## 2019-10-21 LAB — GFR: EGFR: >60

## 2019-10-21 NOTE — Rehab Discharge Instruction (Medilinks) (Signed)
NAMEELVA White  MRN: [id number]  Account: [id number]  Session Start: 10/21/2019 12:00:00 AM  Session Stop: 10/21/2019 12:00:00 AM    Total Treatment Minutes:  Minutes    Case Management  Inpatient Rehabilitation Discharge Instructions    Discharge Date: Thursday, Oct 21, 2019 to [facility] (Skilled  Nursing Facility)  Transportation: Patient to be transported by [employer],  wheelchair [place], pick-up time 11:30 am  Discharge Plan: Patient will discharge to [facility] (Skilled  Nursing Facility), [address], [place], [place] [address],  [phone],  Rm# [id number]    Follow-up Appointment(s):    Please follow-up upon d/c from the Skilled Nursing Facility:    [facility]  [address]  [address]  [place] [address]  [phone]    [facility] [place]  [phone] [address]  [place] [address]  [phone]      Renotification of Medicare Important Message: The Renotification of Medicare  Important Message letter was issued.    Community Resources: None    Additional Information: CM discussed patients discharge plan with patient and  son and both verbalized understanding and agreement with the plan. For questions  please call Hoy Morn, CM [phone].    Signed by: Paulla Fore, MSW 10/21/2019 9:48:00 AM

## 2019-10-21 NOTE — Rehab Discharge Summary (Medilinks) (Signed)
Michael White  MRN: [id number]  Account: [id number]  Session Start: 10/21/2019 12:00:00 AM  Session Stop: 10/21/2019 12:00:00 AM    Total Treatment Minutes:  Minutes    Therapeutic Recreation  Inpatient Rehabilitation Discharge Summary    Rehab Diagnosis: revealed stable R thalamus hemorrhagic infarct.  Demographics:            Age: 64Y            Gender: Male    Past Medical History: Past Medical History:  ? 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  ? Borderline hypertension    on meds in past; BP normal without meds recently  Past Surgical History:  ? CARDIAC ABLATION   04/15/2013    ablation of slow pathway for AVNRT  History of Present Illness: 64 y.o. male with past medical history significant  of hypertension who has not seen any physician for many years what brought into  emergency room by paramedics when he was found confused by his coworker.  CT of  the head done in emergency room showed acute hemorrhagic infarction in the right  thalamus.  At present patient is awake alert oriented to self however has been  intermittently confused.  He was admitted to the ICU, placed on Cardene drip for hypertensive urgency.  Neurosurgery and neurology were consulted. Etiology of ICH is suspected to be  due to hypertension. Neurosurgery recommends no indication for neurosurgical  intervention.  Weaned off Cardene drip.  Mental status has improved. Patient  reports generalized pain otherwise no specific complaints. Patient will be  started on Keppra for 7 days Blood pressure control goal below 140 systolic  patient was a started on hydralazine and amlodipine with good blood pressure  response.  patient is A/T/O X3 and following commands, his last BM was 5/4, he is passing  gas. Patient is tolerating a mechanical soft diet, he is working with PT, OT and  SLP, he had an external foley. Patient would benefit from inpatient AR  before  going home with his family. Covid as negative 5/3.            Date of Onset:  10/04/19            Date of Admission: 10/08/2019 4:57:00 PM    Rehabilitation Precautions/Restrictions:   Fall risk  impulsive, has telesitter    OBJECTIVE  Leisure Participation:  Minimal Assist for participation in leisure activities.  Equipment Recommended: No leisure equipment was recommended at discharge.  Community Mobility: Patient Not assessed.      Education Provided:    Education Provided: Benefit and value of leisure activity.       Audience: Patient.       Mode: Explanation.  Demonstration.       Response: Demonstrated skill.    ASSESSMENT  Summary of Deficits and Recommended Follow-up: Pt is at a min assist level for  leisure activities. Recommend pt stay active in therapeutic leisure tasks to  provide structure, support and carryover functional goals post discharge. D/c  from TR services.  Progress Toward Goals:   LONG TERM GOAL REVIEW:       1. Mr. Mcbryar will participate in familiar leisure task with min assist of  caregiver. - Met       2. Mr. Lagrand will attend to left, during leisure task with min assist/cues  from caregiver. - Met  Time frame to achieve  above long term goal(s):  3 weeks      PLAN  Therapeutic Recreation services are not recommended at this time due to: No need  for skilled therapy intervention at this time.    Care Plan  Identified problems from team documentation:  Problem: Impaired Cardiac Function  Cardiac: Primary Team Goal: Patient will display vital signs within  limits./Active  Cardiac: Additional Team Goal: Patient and caregiver will teack-back  BEFAST./Active    Problem: Impaired Cognition  Cognition: Primary Team Goal: Patient will demonstrate adequate attention and  visual / verbal processing to complete ADLs and mild / moderately complex  cognitive tasks for periods of 20 minutes provided min redirections./Met    Problem: Impaired Mobility  Mobility: Primary Team Goal: Pt will perform  majority of mobility at a  supervision level with most appropriate assistive device for increased safety  and indepednence upon discharge./Not Met    Problem: Impaired Psychosocial Skills/Behavior  PsychoSocial: Primary Team Goal: Increase adjustment to changes post-stroke and  his next phase of life/    Problem: Impaired Self-care Mgmt/ADL/IADL  Self Care: Primary Team Goal: Pt will complete self carea at a superivsio/CGA  level provided by family.care giver in order to return home safely./Met    Problem: Impaired Swallowing  Swallowing: Primary Team Goal: Patient will incorporate swallow strategies to  maintain adequate mastication and functional oral clearance in order to safely  tolerate regular texture/thin liquid diet in 100% opportunities mod I./Met    Problem: Safety Risk and Restraint  Safety: Primary Team Goal: Patient will display learned safety  precautions./Active    Status update for discharge:      Please review Integrated Patient View Care Plan Flowsheet for Team identified  Problems, Interventions, and Goals.    Signed by: Pearson Forster, CTRS 10/21/2019 9:30:00 AM

## 2019-10-21 NOTE — Rehab Discharge Summary ([facility]) (Signed)
NAMEALEXXANDER White  MRN: [id number]  Account: [id number]  Session Start: 10/20/2019 8:00:00 AM  Session Stop: 10/20/2019 9:00:00 AM    Total Treatment Minutes: 60.00 Minutes    Occupational Therapy  Inpatient Rehabilitation Discharge Summary and Note    Rehab Diagnosis: revealed stable R thalamus hemorrhagic infarct.  Demographics:            Age: 64Y            Gender: Male    Past Medical History: Past Medical History:  ? 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  ? Borderline hypertension    on meds in past; BP normal without meds recently  Past Surgical History:  ? CARDIAC ABLATION   04/15/2013    ablation of slow pathway for AVNRT  History of Present Illness: 64 y.o. male with past medical history significant  of hypertension who has not seen any physician for many years what brought into  emergency room by paramedics when he was found confused by his coworker.  CT of  the head done in emergency room showed acute hemorrhagic infarction in the right  thalamus.  At present patient is awake alert oriented to self however has been  intermittently confused.  He was admitted to the ICU, placed on Cardene drip for hypertensive urgency.  Neurosurgery and neurology were consulted. Etiology of ICH is suspected to be  due to hypertension. Neurosurgery recommends no indication for neurosurgical  intervention.  Weaned off Cardene drip.  Mental status has improved. Patient  reports generalized pain otherwise no specific complaints. Patient will be  started on Keppra for 7 days Blood pressure control goal below 140 systolic  patient was a started on hydralazine and amlodipine with good blood pressure  response.  patient is A/T/O X3 and following commands, his last BM was 5/4, he is passing  gas. Patient is tolerating a mechanical soft diet, he is working with PT, OT and  SLP, he had an external foley. Patient would benefit from inpatient  AR before  going home with his family. Covid as negative 5/3.   Date of Onset: 10/04/19   Date of Admission: 10/08/2019 4:57:00 PM  Rehabilitation Precautions/Restrictions:   Fall risk  impulsive, has telesitter    SUBJECTIVE  Patient Report: "Thank you for everything."  Pain: Patient currently without complaints of pain.    OBJECTIVE  General Observation: Cooperative, pleasant, making progress with OT and will  benefit from continued therapy to focus on initiation and processing, as well as  L attention and proprioception.  Vitals:                       Before Activity              After Activity  Vitals  BP Systolic          -                            138  BP Diastolic         -                            75  Pulse                -  102    Cognition: Decreased initiation and processing. See ST documentation for  details.  Perception: decreased proprioception which is improving from evaluation  evidenced by improved midline alignment in sitting and standing.    CARE Tool Discharge Performance  Self-Care Functional Assessment  Eating: 06 - Independent without an assistive device    Oral Hygiene: 04 - Touching Assistance: Helper provides  touching/steadying/contact guard  Assistive Device(s):   None  Context for oral hygiene:   Standing   Usual performance is steadying, however today completed with supervision.    Toileting Hygiene: 04 - Touching Assistance: Helper provides  touching/steadying/contact guard  Toileting Equipment:   Scientist, research (physical sciences) Device(s):   Art gallery manager Self: 04 - Touching Assistance: Helper provides  touching/steadying/contact guard  Location:  Shower.  Assistive Device(s):   Grab bar/arm rest to maintain balance, Hand held shower,  shower seat   Assist for steadying when standing to clean posterior peri areas.    Upper Body Dressing:   04 - Supervision: Helper provides supervision for safety or verbal cues ONLY  Assistive Device(s):   None   while  seated    Lower Body Dressing: 04 - Touching Assistance: Helper provides  touching/steadying/contact guard  Assistive Device(s):   None    Putting On/Taking Off Footwear: 04 - Supervision: Helper provides supervision  for safety or verbal cues ONLY  Assistive Device(s):   None   extra time required    Mobility Functional Assessment  Toilet Transfer: 04 - Touching Assistance: Helper provides  touching/steadying/contact guard  Assistive Device(s):   Grab bars   Usual performance is steadying, however today completed with supervision.    Picking Up Objects: 04 - Touching Assistance: Helper provides  touching/steadying/contact guard  Assistive Device(s:)   None    Today's Treatment Interventions:       Self Care/Home Management:  Patient participated in morning routine  consisting of showering, dressing, and toileting with a urinal for which he  requires overall min to moderate assist dependent primarily on initiation and  processing.  Patient was left seated in chair at nurse's station. Handoff to nurse completed.      Home Exercise Program: The patient was provided with an individualized home  exercise program.  Equipment Provided/Ordered: TBD at SNF.    Education Provided:    Education Provided: Activities of daily living. Functional transfers. Equipment.  Safety.       Audience: Patient.       Mode: Explanation.  Demonstration.       Response: Verbalized understanding.    ASSESSMENT  Summary of Progress and Current Status: He has made fair progress toward self  care goals, but remains limited by initiation, processing, left attention, and  proprioception deficits impacting functional mobility and self care from seated  position. He will benefit from continuing OT at next level of care.    Long Term Goals (status prior to discharge): Pt will utilize L UE at a  Functional Assist level (FUEL scale) durign ADL s in order to complete UB  dressing at a  mod I level. - Goal Not Met  Pt will complete LBdressing at a min A  level. - New Goal  Pt will demonstrate improved midline orientation in order to complete functional  transfers at consistent min A level w LRAD. - New Goal  Pt wll complete toileting including trnasfers at a supervision level w RLAD> -  Goal Not Met  Goal D/C - New Goal  2-3 weeks from eval 10/09/19  Short Term Goals (status prior to discharge): Pt will complete UB dressing,  bathing w min  A  in a timely manner and mod external structure . - Goal Not Met  Pt will complete LB bathing, dressing at a mod A level. - Goal Met  Pt will use L UE at a semi functional level ( FUEL scale) 50 % of the time  during ADL s with no more han 2 vc s. - Goal Met  Pt wll consistently complete functional transfers at min A level w LRAD. - Goal  Not Met  1 week from evaluation 10/09/19    Progress Towards Goals (final status): SHORT TERM GOAL REVIEW:       1. Pt will complete UB dressing, bathing w min  A  in a timely manner and  mod external structure . - Met        2. Pt will complete LB bathing, dressing at a mod A level. - Met       3. Pt will use L UE at a semi functional level ( FUEL scale) 50 % of the  time during ADL s with no more han 2 vc s. - Met       4. Pt will consistently complete functional transfers at min A level w  LRAD. - Met Consistent min A without AD.   LONG TERM GOAL REVIEW:       1. Pt will utilize L UE at a Functional Assist level (FUEL scale) durign  ADL s in order to complete UB dressing at a  mod I level. - Not Met: LUE is  functional assist, however, UB dressing requires supervision.       2. Pt will complete LBdressing at a min A level. - Met CGA to supervision  for LB dressing.        3. Pt will demonstrate improved midline orientation in order to complete  functional transfers at consistent min A level w LRAD. - Met transfers with min  A to steadying without AD.       4. Pt wll complete toileting including trnasfers at a supervision level w  RLAD> - Not Met steadying to supervision using grab  bars.    PLAN  Occupational Therapy Plan: Patient is recommended for sub-acute therapy  services.    Team Care Plan  Please review Integrated Patient View Care Plan Flowsheet for Team identified  Problems, Interventions, and Goals.    Identified problems from team documentation:  Problem: Impaired Cardiac Function  Cardiac: Primary Team Goal: Patient will display vital signs within  limits./Active  Cardiac: Additional Team Goal: Patient and caregiver will teack-back  BEFAST./Active    Problem: Impaired Cognition  Cognition: Primary Team Goal: Patient will demonstrate adequate attention and  visual / verbal processing to complete ADLs and mild / moderately complex  cognitive tasks for periods of 20 minutes provided min redirections./Met    Problem: Impaired Mobility  Mobility: Primary Team Goal: Pt will perform majority of mobility at a  supervision level with most appropriate assistive device for increased safety  and indepednence upon discharge./Not Met    Problem: Impaired Psychosocial Skills/Behavior  PsychoSocial: Primary Team Goal: Increase adjustment to changes post-stroke and  his next phase of life/    Problem: Impaired Self-care Mgmt/ADL/IADL  Self Care: Primary Team Goal: Pt will complete self carea at a superivsio/CGA  level provided by family.care giver in order to return home safely./Active  Problem: Impaired Swallowing  Swallowing: Primary Team Goal: Patient will incorporate swallow strategies to  maintain adequate mastication and functional oral clearance in order to safely  tolerate regular texture/thin liquid diet in 100% opportunities mod I./Met    Problem: Safety Risk and Restraint  Safety: Primary Team Goal: Patient will display learned safety  precautions./Active    Status update for discharge:     Self Care Management:   Primary Goal: Met  Discharge Status Comment:   Patient completes overall morning at routine at min  A level.    3 Hour Rule Minutes: 60 minutes of OT treatment this session  count towards  intensity and duration of therapy requirement. Patient was seen for the full  scheduled time of OT treatment this session.  Therapy Mode Minutes: Individual: 60 minutes.    Signed by: Elsie Amis, OTR/L 10/20/2019 9:00:00 AM

## 2019-10-21 NOTE — Rehab Discharge Summary ([facility]) (Signed)
Michael White  MRN: [id number]  Account: [id number]  Session Start: 10/21/2019 12:00:00 AM  Session Stop: 10/21/2019 12:00:00 AM    Total Treatment Minutes:  Minutes    Case Management  Inpatient Rehabilitation Discharge Summary    Rehab Diagnosis: revealed stable R thalamus hemorrhagic infarct.  Demographics:            Age: 64Y            Gender: Male    Past Medical History: Past Medical History:  ? 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  ? Borderline hypertension    on meds in past; BP normal without meds recently  Past Surgical History:  ? CARDIAC ABLATION   04/15/2013    ablation of slow pathway for AVNRT  History of Present Illness: 64 y.o. male with past medical history significant  of hypertension who has not seen any physician for many years what brought into  emergency room by paramedics when he was found confused by his coworker.  CT of  the head done in emergency room showed acute hemorrhagic infarction in the right  thalamus.  At present patient is awake alert oriented to self however has been  intermittently confused.  He was admitted to the ICU, placed on Cardene drip for hypertensive urgency.  Neurosurgery and neurology were consulted. Etiology of ICH is suspected to be  due to hypertension. Neurosurgery recommends no indication for neurosurgical  intervention.  Weaned off Cardene drip.  Mental status has improved. Patient  reports generalized pain otherwise no specific complaints. Patient will be  started on Keppra for 7 days Blood pressure control goal below 140 systolic  patient was a started on hydralazine and amlodipine with good blood pressure  response.  patient is A/T/O X3 and following commands, his last BM was 5/4, he is passing  gas. Patient is tolerating a mechanical soft diet, he is working with PT, OT and  SLP, he had an external foley. Patient would benefit from inpatient AR before  going  home with his family. Covid as negative 5/3.   Date of Onset:  10/04/19   Date of Admission: 10/08/2019 4:57:00 PM    Discharge Date: Thursday, Oct 21, 2019 to [facility] (Sk[facility]  Nursing Facility)  Transportation: Patient to be transported by [employer],  wheelchair [place], pick-up time 11:30 am  Discharge Plan: Patient will discharge to [facility] (Skilled  Nursing Facility), [address] [place], [place] [address],  [phone],  Rm# [id number]  Community Resources: None    Follow Up Appointment(s):    [facility] - Neurology I  [address]  [address]  [place] [address]  [phone]    [facility] [place]  [phone] [address]  [place] [address]  [phone]      Renotification of Medicare Important Message: The Renotification of Medicare  Important Message letter was issued.    Signed by: Paulla Fore, MSW 10/21/2019 9:49:00 AM

## 2019-10-21 NOTE — Progress Notes (Signed)
Reviewed d/c instruction with the pt and gave the report for the nurse at [facility] who will be assigned to take care of the pt. Pt was picked up by [facility] at 1225. Pt was stable at that time and left with all of his belongings.

## 2019-10-21 NOTE — Progress Notes (Signed)
[facility] Physiatry Attending Face to Face Progress Note    Functional Status/Update:  I reviewed patient's therapy notes to assess functional status and ongoing need for therapies.  Of note,   Chair/Bed-to-Chair Transfer: 04 - Touching Assistance: Helper provides  touching/steadying/contact guard  Assistive Device(s):   None   CG/SBA for stand pivot transfer  Car Transfer: 04 - Touching Assistance: Helper provides  touching/steadying/contact guard  Walk 150 Feet:  04 - Touching Assistance: Helper provides  touching/steadying/contact guard   without AD  Walking 10 Feet on Uneven Surface: 03 - Partial/Moderate Assistance: Helper does  1-25% of the activity   without AD  12 Steps: 04 - Supervision: Helper provides supervision for safety or verbal  cues ONLY   SBA with rails, pt uses reciprocal gait pattern both ascending and descending.  Wheelchair: Patient does not use a wheelchair or scooter.      Subjective:  Face to face trio rounding with nurse [place] Aus.  The pt is alert, without C/O, and ready for discharge to a SNF today.      Objective:   Vitals BP 127/75    Pulse 78    Temp 98 F (36.7 C) (Oral)    Resp 18    Ht 1.803 m (5\' 11" )    Wt 63.5 kg (139 lb 15.9 oz)    SpO2 99%    BMI 19.52 kg/m     Physical Examination:  Appears well.  In no acute distress. Normal body habitus.  Anicteric sclerae. Conjunctivae non-injected.  Symmetric facies. Moist mucous membranes.  Heart rate and rhythm are regular. No murmurs/rubs/gallops.  Lungs are clear to auscultation bilaterally. No wheezes, rales, or rhonchi.  Soft. Non-tender. Normoactive bowel sounds.  Alert Pleasant and cooperative    New Labs    Results     Procedure Component Value Units Date/Time    Basic Metabolic Panel [[id number]] Collected: 10/21/19 0633    Specimen: Blood Updated: 10/21/19 0727     Glucose 89 mg/dL      BUN 17 mg/dL      Creatinine 0.9 mg/dL      Calcium 9.2 mg/dL      Sodium 295 mEq/L      Potassium 4.5 mEq/L      Chloride 100 mEq/L      CO2 27 mEq/L       Anion Gap 9.0    GFR [[id number]] Collected: 10/21/19 0633     Updated: 10/21/19 0727     EGFR >60.0    CBC without differential [[id number]]  (Abnormal) Collected: 10/21/19 0633    Specimen: Blood Updated: 10/21/19 0712     WBC 5.60 x10 3/uL      Hgb 11.6 g/dL      Hematocrit 44.0 %      Platelets 379 x10 3/uL      RBC 5.56 x10 6/uL      MCV 67.1 fL      MCH 20.9 pg      MCHC 31.1 g/dL      RDW 21 %      MPV 8.9 fL      Nucleated RBC 0.0 /100 WBC      Absolute NRBC 0.00 x10 3/uL           Current medications:  Current Facility-Administered Medications   Medication Dose Route Frequency    amLODIPine  5 mg Oral Daily    hydrALAZINE  50 mg Oral Q8H    senna-docusate  2 tablet Oral Daily  sertraline  50 mg Oral Daily    tamsulosin  0.4 mg Oral Daily after dinner    vitamin D (ergocalciferol)  50,000 Units Oral Q Sun (AM)       Medication Review  1. A complete drug regimen review was completed: Yes  2. Were any drug issues found during review?: No   If yes    Was I contacted and action was taken by midnight of the next calendar day once issue was identified?: N/A    Person who contacted me: N/A   What was the issue?: N/A   Action taken: N/A   What was the time the issue was identified?: N/A    Assessment: 64 y.o. male with Hemorrhagic stroke    Plan:  Discharge to skilled nursing facility today.

## 2019-10-25 DIAGNOSIS — I10 Essential (primary) hypertension: Secondary | ICD-10-CM | POA: Diagnosis not present

## 2019-10-27 NOTE — Rehab PPS CMG ([facility]) (Signed)
Michael White  MRN: [id number]  Account: [id number]  Session Start: 10/21/2019 12:00:00 AM  Session Stop: 10/21/2019 12:00:00 AM    Total Treatment Minutes:  Minutes    PPS CMG Coordinator  Inpatient Rehabilitation Discharge    Discharge Against Medical Advice:  No.  Discharge Information: Patient Discharged Alive:  Yes  Discharge Destination/Living Setting: Skilled Nursing Facility    Impairment Group: Stroke: 01.4 No Paresis    Comorbidities:  Rank Code      Description    1    I10.      Essential (primary) hypertension  2    R44.3     Hallucinations, unspecified  3    R26.89    Other abnormalities of gait and mobility  4    B35.1     Tinea unguium  5    E55.9     Vitamin D deficiency, unspecified  6    R25.2     Cramp and spasm  7    R13.10    Dysphagia, unspecified  8    M75.111   Incomplete rotator cuff tear or rupture of                 right shoulder, not specified as traumatic  9    L85.9     Epidermal thickening, unspecified  10   Z79.899   Other long term (current) drug therapy  11   Z87.891   Personal history of nicotine dependence  12   R33.9     Retention of urine, unspecified    ********************************  Complications:  Rank Code      Description    1    R44.3     Hallucinations, unspecified  2    B35.1     Tinea unguium  3    E55.9     Vitamin D deficiency, unspecified  4    L85.9     Epidermal thickening, unspecified  5    R33.9     Retention of urine, unspecified    QUALITY INDICATORS  Section GG: Functional Abilities  Self Care Discharge Performance                       Performance  GG0130. Self Care  Eating               6 - Independent  Oral hygiene         4 - Supervision or touching assistance  Toileting hygiene    4 - Supervision or touching assistance  Shower/bathe self    4 - Supervision or touching assistance  Upper body dressing  4 - Supervision or touching assistance  Lower body dressing  4 - Supervision or touching assistance  Footwear on/off      4 - Supervision or touching  assistance    Mobility Discharge Performance                       Performance  GG0170. Mobility  Roll left/right      4 - Supervision or touching assistance  Sit to lying         4 - Supervision or touching assistance  Lying to sitting bed 4 - Supervision or touching assistance  Sit to stand         4 - Supervision or touching assistance  Chair/bed transfer   4 - Supervision or touching assistance  Toilet transfer      4 -  Supervision or touching assistance  Car transfer         4 - Supervision or touching assistance  Walk 10 feet         4 - Supervision or touching assistance  Walk 50 ft 2 turns   4 - Supervision or touching assistance  Walk 150 feet        4 - Supervision or touching assistance  10 ft uneven surface 3 - Partial/moderate assistance  1 step (curb)        4 - Supervision or touching assistance  4 steps              4 - Supervision or touching assistance  12 steps             4 - Supervision or touching assistance  Picking up object    4 - Supervision or touching assistance  Use wheelchair?      No    Section M. Skin Conditions Discharge  Unhealed Pressure Ulcer(s)/Injurie(s) at Stage 1 or Higher:  No    Health Conditions: Fall(s) Since Admission:  No    Section N. Medication    Medication Intervention: N/A - There were no potential clinically significant  medication issues identified since admission or patient is not taking any  medications.    Signed by: Malachy Mood, PT, PPS Coordinator 10/21/2019 5:00:00 PM

## 2019-11-04 DIAGNOSIS — Z5181 Encounter for therapeutic drug level monitoring: Secondary | ICD-10-CM | POA: Diagnosis not present

## 2019-11-04 DIAGNOSIS — G8929 Other chronic pain: Secondary | ICD-10-CM | POA: Diagnosis not present

## 2019-11-04 DIAGNOSIS — M545 Low back pain: Secondary | ICD-10-CM | POA: Diagnosis not present

## 2019-11-04 DIAGNOSIS — Z79891 Long term (current) use of opiate analgesic: Secondary | ICD-10-CM | POA: Diagnosis not present

## 2019-11-23 ENCOUNTER — Telehealth: Payer: Self-pay

## 2019-11-30 DIAGNOSIS — L819 Disorder of pigmentation, unspecified: Secondary | ICD-10-CM | POA: Diagnosis not present

## 2019-11-30 DIAGNOSIS — D229 Melanocytic nevi, unspecified: Secondary | ICD-10-CM | POA: Diagnosis not present

## 2019-11-30 DIAGNOSIS — L821 Other seborrheic keratosis: Secondary | ICD-10-CM | POA: Diagnosis not present

## 2019-11-30 DIAGNOSIS — L814 Other melanin hyperpigmentation: Secondary | ICD-10-CM | POA: Diagnosis not present

## 2019-11-30 DIAGNOSIS — D485 Neoplasm of uncertain behavior of skin: Secondary | ICD-10-CM | POA: Diagnosis not present

## 2019-11-30 DIAGNOSIS — L570 Actinic keratosis: Secondary | ICD-10-CM | POA: Diagnosis not present

## 2019-11-30 DIAGNOSIS — C44529 Squamous cell carcinoma of skin of other part of trunk: Secondary | ICD-10-CM | POA: Diagnosis not present

## 2019-11-30 DIAGNOSIS — L905 Scar conditions and fibrosis of skin: Secondary | ICD-10-CM | POA: Diagnosis not present

## 2019-12-02 DIAGNOSIS — Z79899 Other long term (current) drug therapy: Secondary | ICD-10-CM | POA: Diagnosis not present

## 2019-12-02 DIAGNOSIS — M545 Low back pain: Secondary | ICD-10-CM | POA: Diagnosis not present

## 2019-12-02 DIAGNOSIS — Z5181 Encounter for therapeutic drug level monitoring: Secondary | ICD-10-CM | POA: Diagnosis not present

## 2019-12-02 DIAGNOSIS — R5383 Other fatigue: Secondary | ICD-10-CM | POA: Diagnosis not present

## 2019-12-02 DIAGNOSIS — G8929 Other chronic pain: Secondary | ICD-10-CM | POA: Diagnosis not present

## 2019-12-02 DIAGNOSIS — Z87891 Personal history of nicotine dependence: Secondary | ICD-10-CM | POA: Diagnosis not present

## 2019-12-23 DIAGNOSIS — K5900 Constipation, unspecified: Secondary | ICD-10-CM | POA: Diagnosis not present

## 2019-12-23 DIAGNOSIS — K5790 Diverticulosis of intestine, part unspecified, without perforation or abscess without bleeding: Secondary | ICD-10-CM | POA: Diagnosis not present

## 2019-12-31 DIAGNOSIS — L905 Scar conditions and fibrosis of skin: Secondary | ICD-10-CM | POA: Diagnosis not present

## 2019-12-31 DIAGNOSIS — C44529 Squamous cell carcinoma of skin of other part of trunk: Secondary | ICD-10-CM | POA: Diagnosis not present

## 2020-01-03 DIAGNOSIS — Z5181 Encounter for therapeutic drug level monitoring: Secondary | ICD-10-CM | POA: Diagnosis not present

## 2020-01-03 DIAGNOSIS — I10 Essential (primary) hypertension: Secondary | ICD-10-CM | POA: Diagnosis not present

## 2020-01-03 DIAGNOSIS — Z79891 Long term (current) use of opiate analgesic: Secondary | ICD-10-CM | POA: Diagnosis not present

## 2020-01-03 DIAGNOSIS — G8929 Other chronic pain: Secondary | ICD-10-CM | POA: Diagnosis not present

## 2020-01-03 DIAGNOSIS — M545 Low back pain: Secondary | ICD-10-CM | POA: Diagnosis not present

## 2020-02-03 DIAGNOSIS — M545 Low back pain: Secondary | ICD-10-CM | POA: Diagnosis not present

## 2020-02-03 DIAGNOSIS — Z79891 Long term (current) use of opiate analgesic: Secondary | ICD-10-CM | POA: Diagnosis not present

## 2020-02-03 DIAGNOSIS — Z139 Encounter for screening, unspecified: Secondary | ICD-10-CM | POA: Diagnosis not present

## 2020-02-03 DIAGNOSIS — G8929 Other chronic pain: Secondary | ICD-10-CM | POA: Diagnosis not present

## 2020-02-03 DIAGNOSIS — Z5181 Encounter for therapeutic drug level monitoring: Secondary | ICD-10-CM | POA: Diagnosis not present

## 2020-03-03 DIAGNOSIS — G8929 Other chronic pain: Secondary | ICD-10-CM | POA: Diagnosis not present

## 2020-03-03 DIAGNOSIS — Z79891 Long term (current) use of opiate analgesic: Secondary | ICD-10-CM | POA: Diagnosis not present

## 2020-03-03 DIAGNOSIS — Z5181 Encounter for therapeutic drug level monitoring: Secondary | ICD-10-CM | POA: Diagnosis not present

## 2020-03-03 DIAGNOSIS — Z87891 Personal history of nicotine dependence: Secondary | ICD-10-CM | POA: Diagnosis not present

## 2020-03-03 DIAGNOSIS — Z79899 Other long term (current) drug therapy: Secondary | ICD-10-CM | POA: Diagnosis not present

## 2020-04-03 DIAGNOSIS — G8929 Other chronic pain: Secondary | ICD-10-CM | POA: Diagnosis not present

## 2020-04-03 DIAGNOSIS — M545 Low back pain, unspecified: Secondary | ICD-10-CM | POA: Diagnosis not present

## 2020-04-03 DIAGNOSIS — Z23 Encounter for immunization: Secondary | ICD-10-CM | POA: Diagnosis not present

## 2020-04-03 DIAGNOSIS — E1159 Type 2 diabetes mellitus with other circulatory complications: Secondary | ICD-10-CM | POA: Diagnosis not present

## 2020-05-03 DIAGNOSIS — G8929 Other chronic pain: Secondary | ICD-10-CM | POA: Diagnosis not present

## 2020-05-03 DIAGNOSIS — I10 Essential (primary) hypertension: Secondary | ICD-10-CM | POA: Diagnosis not present

## 2020-05-03 DIAGNOSIS — M545 Low back pain, unspecified: Secondary | ICD-10-CM | POA: Diagnosis not present

## 2020-05-03 DIAGNOSIS — E1159 Type 2 diabetes mellitus with other circulatory complications: Secondary | ICD-10-CM | POA: Diagnosis not present

## 2020-05-11 NOTE — Rehab Progress Note ([facility]) (Signed)
Michael White  MRN: [id number]  Account: [id number]  Session Start: 10/19/2019 12:00:00 AM  Session Stop: 10/19/2019 12:00:00 AM    Total Treatment Minutes:  Minutes    Case Management  Inpatient Rehabilitation Team Conference    Conference Date/Time: 10/12/2019 2:18:00 PM    Demographics            Age: 89Y            Gender: Male    Admission Date: 10/08/2019 4:57:00 PM  Diagnosis: revealed stable R thalamus hemorrhagic infarct.  Comorbidities:    VITAL SIGNS  Blood Pressure: 128/86 mmHg  Temperature:  degrees  Pulse:  beats per minute  Respirations:  breaths per minute  Pain: 2/10    WEIGHT and NUTRITION  Admission Weight: 144 pounds; Current Weight: 144pounds  Weight Change since Admit: Patient has had no weight change since admission.  Food Consistency:  Liquid Consistency:None    Plan Of Care  Anticipated Discharge Date/Estimated Length of Stay: 10/21/19  Anticipated Discharge Destination: Facility setting  Fall Risk Level: Red (High)  Medical Necessity Expected Level Rationale: CVA  Intensity and Duration: an average of 3 hours/5 days per week  Medical Supervision and 24 Hour Rehab Nursing: x  Physical Therapy: x  PT Intensity/Duration: 60-135m/d x 10-14d  Occupational Therapy: x  OT Intensity/Duration: 60-148m/d x 10-14d  Speech and Language Therapy: x  SLP Intensity/Duration: 60-128m/d x 10-14d  Case Management: Paulla Fore, CM  Nurse: Veryl Speak, RN  Occupational Therapy: Elsie Amis, OT  Physical Therapy: Jaquita Rector, PT  Physician: Percell Locus, DO  Speech Language Pathology: Jamison Neighbor, SLP  Therapeutic Recreation: Pearson Forster, TR    The following is a list of patient problems that have been identified by the  interdisciplinary team:    Problem: Impaired Cardiac Function  Cardiac Status Update: has fluctuating SBP 130's-160's.  Team Identified Barrier to Discharge: Yes  Interventions:  Administer cardiac medications as prescribed monitoring for side effects: Active  Monitor vital  signs/hemodynamics and report changes: Active  Provide structured rest periods: Active  Patient/family/caregiver education: Active  Cardiac: Primary Team Goal/Status: Patient will display vital signs within  limits. / Active  Cardiac: Additional Team Goal/Status: Patient and caregiver will teack-back  BEFAST. / Active    Problem: Impaired Cognition  Cognition Status Update: Continues to require moderate A to initiate and execute  familiar, concrete tasks.Moderate delayed processing within 2 step tasks,  requiring consistent prompting for ADL task drive.  Team Identified Barrier to Discharge: Yes  Interventions:  Decrease environmental stimuli: Active  Safety awareness training: Active  Compensatory strategies: Active  Cognition: Primary Team Goal/Status: Patient will demonstrate adequate attention  and visual / verbal processing to complete ADLs and mild / moderately complex  cognitive tasks for periods of 20 minutes provided min redirections. / Active    Problem: Impaired Mobility  Mobility Status Update: Supervision bed mobility, CGA stand pivot transfers, CGA  gait household distances with RW or Min A without AD, CGA to negotiate 4 stairs  with B rails.  Interventions:  Transfer training: Active  Gait training: Active  Mobility: Primary Team Goal/Status: Pt will perform majority of mobility at a  supervision level with most appropriate assistive device for increased safety  and indepednence upon discharge. / Active    Problem: Impaired Psychosocial Skills/Behavior  PsychoSocial: Primary Team Goal/Status: Increase adjustment to changes  post-stroke and his next phase of life /    Problem: Impaired Self-care Mgmt/ADL/IADL  Self Care/ADL/IADL Status Update:  Patient requires min to mod A for self care  primarily secondary to cognitive deficits and decreased midline orientation.  Team Identified Barrier to Discharge: Yes  Interventions:  Adaptive equipment training: Active  Compensatory strategies: Active  Encourage  patient to participate in activities of daily living: Active  Self Care: Primary Team Goal/Status: Pt will complete self carea at a  superivsio/CGA level provided by family.care giver in order to return home  safely. / Active    Problem: Impaired Swallowing  Team Identified Barrier to Discharge: Yes  Interventions:  Monitor for signs and symptoms of aspiration: Active  Encourage/facilitated utilization of compensatory strategies for safe oral  intake: Active  Swallowing: Primary Team Goal/Status: Patient will incorporate swallow  strategies to maintain adequate mastication and functional oral clearance in  order to safely tolerate regular texture/thin liquid diet in 100% opportunities  mod I. / Active    Problem: Safety Risk and Restraint  Safety Risk Status Update: Patient has periods of restlessness; AVS monitoring  remains in place.  Team Identified Barrier to Discharge: Yes  Interventions:  Identify patient at risk for falls: Active  Call light within reach: Active  Orient patient/family/caregiver to room and equipment: Active  Stay with patient in bathroom: Active  Bed alarm/wheelchair alarm: Active  Anticipate needs, including personal items within reach: Active  Safety: Primary Team Goal/Status: Patient will display learned safety  precautions. / Active    Self Care Functional Status  Eating:  5 - Setup or clean-up assistance  Oral Hygiene:  3 - Partial/moderate assistance  Toileting:  3 - Partial/moderate assistance  Shower Bathe:  3 - Partial/moderate assistance  Upper Body Dressing:  10 - Not attempted due to environmental limitations  Lower Body Dressing:  3 - Partial/moderate assistance  Putting On/Taking Off Footwear:  1 - Dependent - 1 Helper    Mobility Functional Status  Roll Left and Right:  4 - Supervision or touching assistance  Sit to Lying:  4 - Supervision or touching assistance  Lying to Sitting on Side of Bed:  4 - Supervision or touching assistance  Sit to Stand:  4 - Supervision or touching  assistance  Chair/Bed-to-Chair Transfer:  4 - Supervision or touching assistance  Toilet Transfer:  3 - Partial/moderate assistance  Car Transfer:  87 - Not attempted due to medical condition or safety concern  Walk 10 Feet:  4 - Supervision or touching assistance  Walk 50 Feet with Two Turns:  4 - Supervision or touching assistance  Walking 150 Feet:  88 - Not attempted due to medical condition or safety concern  Walking 10 Feet on Uneven Surface:  88 - Not attempted due to medical condition  or safety concern  1 Step - Curb:  4 - Supervision or touching assistance  4 Steps:  4 - Supervision or touching assistance  12 Steps:  27 - Not attempted due to medical condition or safety concern  Picking Up an Object:  15 - Not attempted due to medical condition or safety  concern  Wheel 50 Feet with Two Turns:  1 - Dependent - 1 Helper  Wheel 150 Feet:  1 - Dependent - 1 Helper      Comments: None    Signed by: Paulla Fore, MSW 10/19/2019 3:58:00 PM    Physician CoSigned By: Percell Locus 05/11/2020 18:02:34

## 2020-06-06 DIAGNOSIS — Z79899 Other long term (current) drug therapy: Secondary | ICD-10-CM | POA: Diagnosis not present

## 2020-06-06 DIAGNOSIS — M545 Low back pain, unspecified: Secondary | ICD-10-CM | POA: Diagnosis not present

## 2020-06-06 DIAGNOSIS — G8929 Other chronic pain: Secondary | ICD-10-CM | POA: Diagnosis not present

## 2020-06-06 DIAGNOSIS — E1159 Type 2 diabetes mellitus with other circulatory complications: Secondary | ICD-10-CM | POA: Diagnosis not present

## 2020-06-06 DIAGNOSIS — J449 Chronic obstructive pulmonary disease, unspecified: Secondary | ICD-10-CM | POA: Diagnosis not present

## 2020-07-07 DIAGNOSIS — J449 Chronic obstructive pulmonary disease, unspecified: Secondary | ICD-10-CM | POA: Diagnosis not present

## 2020-07-07 DIAGNOSIS — G8929 Other chronic pain: Secondary | ICD-10-CM | POA: Diagnosis not present

## 2020-07-07 DIAGNOSIS — M545 Low back pain, unspecified: Secondary | ICD-10-CM | POA: Diagnosis not present

## 2020-08-04 DIAGNOSIS — M545 Low back pain, unspecified: Secondary | ICD-10-CM | POA: Diagnosis not present

## 2020-08-04 DIAGNOSIS — G8929 Other chronic pain: Secondary | ICD-10-CM | POA: Diagnosis not present

## 2020-08-04 DIAGNOSIS — Z720 Tobacco use: Secondary | ICD-10-CM | POA: Diagnosis not present

## 2020-08-04 DIAGNOSIS — J449 Chronic obstructive pulmonary disease, unspecified: Secondary | ICD-10-CM | POA: Diagnosis not present

## 2020-08-21 DIAGNOSIS — Z9181 History of falling: Secondary | ICD-10-CM | POA: Diagnosis not present

## 2020-08-21 DIAGNOSIS — E785 Hyperlipidemia, unspecified: Secondary | ICD-10-CM | POA: Diagnosis not present

## 2020-08-21 DIAGNOSIS — Z0000 Encounter for general adult medical examination without abnormal findings: Secondary | ICD-10-CM | POA: Diagnosis not present

## 2020-09-07 DIAGNOSIS — Z720 Tobacco use: Secondary | ICD-10-CM | POA: Diagnosis not present

## 2020-09-07 DIAGNOSIS — G8929 Other chronic pain: Secondary | ICD-10-CM | POA: Diagnosis not present

## 2020-09-07 DIAGNOSIS — Z79899 Other long term (current) drug therapy: Secondary | ICD-10-CM | POA: Diagnosis not present

## 2020-09-07 DIAGNOSIS — M545 Low back pain, unspecified: Secondary | ICD-10-CM | POA: Diagnosis not present

## 2020-09-07 DIAGNOSIS — J449 Chronic obstructive pulmonary disease, unspecified: Secondary | ICD-10-CM | POA: Diagnosis not present

## 2020-09-19 DIAGNOSIS — H8110 Benign paroxysmal vertigo, unspecified ear: Secondary | ICD-10-CM | POA: Diagnosis not present

## 2020-09-19 DIAGNOSIS — I10 Essential (primary) hypertension: Secondary | ICD-10-CM | POA: Diagnosis not present

## 2020-09-25 DIAGNOSIS — R262 Difficulty in walking, not elsewhere classified: Secondary | ICD-10-CM | POA: Diagnosis not present

## 2020-09-25 DIAGNOSIS — H8110 Benign paroxysmal vertigo, unspecified ear: Secondary | ICD-10-CM | POA: Diagnosis not present

## 2020-09-25 DIAGNOSIS — R42 Dizziness and giddiness: Secondary | ICD-10-CM | POA: Diagnosis not present

## 2020-09-29 DIAGNOSIS — G8929 Other chronic pain: Secondary | ICD-10-CM | POA: Diagnosis not present

## 2020-09-29 DIAGNOSIS — M545 Low back pain, unspecified: Secondary | ICD-10-CM | POA: Diagnosis not present

## 2020-09-29 DIAGNOSIS — J449 Chronic obstructive pulmonary disease, unspecified: Secondary | ICD-10-CM | POA: Diagnosis not present

## 2020-11-22 LAB — HEMOGLOBIN A1C
Est. Avg. Glucose, WB: 105
Est. Avg. Glucose-calculated: 111
Hemoglobin A1C: 5.3 % (ref 4.0–6.0)

## 2020-11-23 DIAGNOSIS — H539 Unspecified visual disturbance: Secondary | ICD-10-CM | POA: Diagnosis not present

## 2020-11-23 DIAGNOSIS — J069 Acute upper respiratory infection, unspecified: Secondary | ICD-10-CM | POA: Diagnosis not present

## 2020-11-23 LAB — COMPREHENSIVE METABOLIC PANEL
ALT: 18 U/L (ref 0–50)
AST: 19 U/L (ref 0–50)
Albumin/Globulin Ratio: 1.6 (ref 1.00–2.70)
Albumin: 4.4 g/dL (ref 3.5–5.2)
Alk Phosphatase: 89 U/L (ref 40–130)
Anion Gap: 13 mmol/L (ref 2–17)
BUN: 7 mg/dL — ABNORMAL LOW (ref 8–23)
Bun/Cre Ratio: 7 (ref 6.0–17.0)
CO2: 25 mmol/L (ref 22–29)
Calcium: 9.2 mg/dL (ref 8.8–10.2)
Chloride: 103 mmol/L (ref 98–107)
Creatinine: 1 mg/dL (ref 0.7–1.3)
Est, Glom Filt Rate: 83 mL/min/1.73m?? — ABNORMAL LOW (ref >=90)
Globulin: 2.7 g/dL (ref 1.9–4.4)
Glucose: 85 mg/dL (ref 70–99)
OSMOLALITY CALCULATED: 278 mOsm/kg (ref 270–287)
Potassium: 4.3 mmol/L (ref 3.5–5.3)
Sodium: 141 mmol/L (ref 135–145)
Total Bilirubin: 1.4 mg/dL — ABNORMAL HIGH (ref 0.00–1.20)
Total Protein: 7.1 g/dL (ref 6.4–8.3)

## 2020-11-23 LAB — CBC
Hematocrit: 42.4 % (ref 38.0–52.0)
Hemoglobin: 13 g/dL (ref 13.0–17.3)
MCH: 20.7 pg — ABNORMAL LOW (ref 27.0–34.5)
MCHC: 30.7 g/dL — ABNORMAL LOW (ref 32.0–36.0)
MCV: 67.5 fL — ABNORMAL LOW (ref 84.0–100.0)
MPV: 10.6 fL (ref 7.2–13.2)
NRBC Absolute: 0.06 10*3/uL — ABNORMAL HIGH (ref 0.000–0.012)
NRBC Automated: 0.9 % — ABNORMAL HIGH (ref 0.0–0.2)
Platelets: 292 10*3/uL (ref 140–440)
RBC: 6.28 x10e6/mcL — ABNORMAL HIGH (ref 4.00–5.60)
RDW: 22 % — ABNORMAL HIGH (ref 11.0–16.0)
WBC: 7 10*3/uL (ref 3.8–10.6)

## 2020-11-23 LAB — LIPID PANEL
Chol/HDL Ratio: 2.6 (ref 0.0–4.4)
Cholesterol: 172 mg/dL (ref 100–200)
HDL: 66 mg/dL (ref >=40)
LDL Cholesterol: 86.8 mg/dL (ref 0.0–100.0)
LDL/HDL Ratio: 1.3
Triglycerides: 96 mg/dL (ref 0–149)
VLDL: 19.2 mg/dL (ref 5.0–40.0)

## 2020-11-23 LAB — TSH: TSH, 3RD GENERATION: 1.31 mcIU/mL (ref 0.358–3.740)

## 2020-12-11 DIAGNOSIS — H3581 Retinal edema: Secondary | ICD-10-CM | POA: Diagnosis not present

## 2020-12-11 DIAGNOSIS — H02831 Dermatochalasis of right upper eyelid: Secondary | ICD-10-CM | POA: Diagnosis not present

## 2020-12-11 DIAGNOSIS — H5310 Unspecified subjective visual disturbances: Secondary | ICD-10-CM | POA: Diagnosis not present

## 2020-12-11 DIAGNOSIS — H43392 Other vitreous opacities, left eye: Secondary | ICD-10-CM | POA: Diagnosis not present

## 2020-12-11 DIAGNOSIS — E119 Type 2 diabetes mellitus without complications: Secondary | ICD-10-CM | POA: Diagnosis not present

## 2020-12-11 DIAGNOSIS — H02834 Dermatochalasis of left upper eyelid: Secondary | ICD-10-CM | POA: Diagnosis not present

## 2020-12-11 DIAGNOSIS — H2513 Age-related nuclear cataract, bilateral: Secondary | ICD-10-CM | POA: Diagnosis not present

## 2020-12-19 DIAGNOSIS — M961 Postlaminectomy syndrome, not elsewhere classified: Secondary | ICD-10-CM | POA: Diagnosis not present

## 2020-12-19 DIAGNOSIS — Z0189 Encounter for other specified special examinations: Secondary | ICD-10-CM | POA: Diagnosis not present

## 2020-12-19 DIAGNOSIS — M542 Cervicalgia: Secondary | ICD-10-CM | POA: Diagnosis not present

## 2020-12-19 DIAGNOSIS — M4727 Other spondylosis with radiculopathy, lumbosacral region: Secondary | ICD-10-CM | POA: Diagnosis not present

## 2020-12-19 DIAGNOSIS — Z5181 Encounter for therapeutic drug level monitoring: Secondary | ICD-10-CM | POA: Diagnosis not present

## 2020-12-19 DIAGNOSIS — G8929 Other chronic pain: Secondary | ICD-10-CM | POA: Diagnosis not present

## 2020-12-29 DIAGNOSIS — M545 Low back pain, unspecified: Secondary | ICD-10-CM | POA: Diagnosis not present

## 2020-12-29 DIAGNOSIS — J449 Chronic obstructive pulmonary disease, unspecified: Secondary | ICD-10-CM | POA: Diagnosis not present

## 2020-12-29 DIAGNOSIS — Z87891 Personal history of nicotine dependence: Secondary | ICD-10-CM | POA: Diagnosis not present

## 2020-12-29 DIAGNOSIS — E1159 Type 2 diabetes mellitus with other circulatory complications: Secondary | ICD-10-CM | POA: Diagnosis not present

## 2020-12-29 DIAGNOSIS — K219 Gastro-esophageal reflux disease without esophagitis: Secondary | ICD-10-CM | POA: Diagnosis not present

## 2020-12-29 DIAGNOSIS — G8929 Other chronic pain: Secondary | ICD-10-CM | POA: Diagnosis not present

## 2020-12-29 DIAGNOSIS — I10 Essential (primary) hypertension: Secondary | ICD-10-CM | POA: Diagnosis not present

## 2021-01-08 DIAGNOSIS — Z23 Encounter for immunization: Secondary | ICD-10-CM | POA: Diagnosis not present

## 2021-01-08 DIAGNOSIS — S60521A Blister (nonthermal) of right hand, initial encounter: Secondary | ICD-10-CM | POA: Diagnosis not present

## 2021-01-09 ENCOUNTER — Telehealth

## 2021-01-09 NOTE — Telephone Encounter (Signed)
I gave verbal orders they also needed an order (referral) as well. I updated that too.

## 2021-01-09 NOTE — Telephone Encounter (Signed)
Okay to give verbal orders?

## 2021-01-09 NOTE — Telephone Encounter (Signed)
Maisie Fus is calling from [employer]. Thelma Barge he is the occupational therapist.    He is calling to get verbal orders for the patient to receive occupational therapy and physical therapy, and out patient services at [facility]. Sanjuana Kava.     Please call him with the verbal orders.   [phone]    Sent to B. Carylon Perches   On the board for Dr. Pattricia Boss

## 2021-01-15 ENCOUNTER — Telehealth

## 2021-01-15 NOTE — Telephone Encounter (Signed)
Spoke to [employer] with [facility] who states that the patient will need a referral for Ot and PT and have the order sent  [facility] on [address].  She states that on                                                                                01/19/21  the patient will be discharged from home health services

## 2021-01-16 NOTE — Telephone Encounter (Signed)
Did ref

## 2021-01-18 ENCOUNTER — Ambulatory Visit: Admit: 2021-01-18 | Discharge: 2021-01-18 | Payer: MEDICARE | Attending: Internal Medicine | Primary: Internal Medicine

## 2021-01-18 DIAGNOSIS — E785 Hyperlipidemia, unspecified: Secondary | ICD-10-CM

## 2021-01-18 MED ORDER — MELOXICAM 7.5 MG PO TABS
7.5 MG | ORAL_TABLET | Freq: Every day | ORAL | 3 refills | Status: DC
Start: 2021-01-18 — End: 2021-04-20

## 2021-01-18 MED ORDER — ATORVASTATIN CALCIUM 40 MG PO TABS
40 MG | ORAL_TABLET | Freq: Every day | ORAL | 3 refills | Status: DC
Start: 2021-01-18 — End: 2021-06-08

## 2021-01-18 MED ORDER — SILDENAFIL CITRATE 50 MG PO TABS
50 | ORAL_TABLET | ORAL | 0 refills | Status: DC | PRN
Start: 2021-01-18 — End: 2021-04-20

## 2021-01-18 NOTE — Assessment & Plan Note (Signed)
Stable.  Transitioning to outpatient therapy.

## 2021-01-18 NOTE — Assessment & Plan Note (Signed)
On statin therapy, 40 mg of atorvastatin.  May need to increase therapy based on medical guidelines with known stroke.   Latest Reference Range & Units 11/22/20 14:39   Chol/HDL Ratio 0.0 - 4.4  2.6   Cholesterol 100 - 200 mg/dL 635   HDL Cholesterol >=05 mg/dL 66   LDL Cholesterol 0.0 - 100.0 mg/dL 61.9   LDl/HDL Ratio  1.3   Triglycerides 0 - 149 mg/dL 96   VLDL 5.0 - 38.4 mg/dL 19.2

## 2021-01-18 NOTE — Assessment & Plan Note (Signed)
Atypical in nature.  Seems less likely cardiac though at high risk secondary to known cerebrovascular accident.  Obtain a CT angiography of the coronaries.

## 2021-01-18 NOTE — Assessment & Plan Note (Signed)
Continue risk reduction therapy.  Continue aspirin, statin, blood pressure management.  No history of diabetes.  Continuing rehab for noted deficits.  No longer smoking.

## 2021-01-18 NOTE — Assessment & Plan Note (Signed)
No chest pain.  Blood pressure stable.  Initially mildly elevated but improved.  Continue current regimen.

## 2021-01-18 NOTE — Assessment & Plan Note (Signed)
Start daily meloxicam along with Tylenol.  Obtain a CT of the lumbar spine.  Consider referral to orthospine if any abnormalities.

## 2021-01-18 NOTE — Patient Instructions (Addendum)
Start Mobic 7.5 mg daily for your shoulder and back pain.  Continues Tylenol does milligrams twice a day for pain control.  Will complete paperwork regarding Telaride and mailed to your home next week.  Make sure you complete your portion.  Continue Norvasc and statin therapy.  Continue exercise prescribed by physical therapy at home.  Referral to orthopedic surgery being sent today.  Completing a CT scan of your lower back as well as a CT coronary angiogram to evaluate your chest pain with known history of vascular disease.

## 2021-01-18 NOTE — Assessment & Plan Note (Signed)
Acute on chronic shoulder pain secondary to being moved in rehab.  Previous documentation of left shoulder pain noted in care everywhere.  Full details not available.  Obtaining referral to orthopedic surgery.

## 2021-01-18 NOTE — Progress Notes (Addendum)
HPI   Chief Complaint  Chief Complaint   Patient presents with    Follow-up       HISTORY OF PRESENT ILLNESS  65 y.o. male presents today for follow-up on: Previous stroke, low back pain, gait instability.  Patient was complains of pain in his left shoulder which is acute on chronic.  Patient is currently completed home health therapy.  Home health therapy note states the patient has progressed to outpatient therapy at this time.  Patient states that he can do outpatient therapy easily at this time as he has no ride.  He is requested [place] to fill out paperwork Telaride.  Patient continues to have low back pain acute on chronic.  Worse since his stroke.  This was present during her last visit.  No significant change.  Recommended continue back exercises and physical therapy.  Has had continued left shoulder pain.  Review of previous records in care everywhere demonstrate this been a chronic issue for the patient.  Currently utilizing Tylenol and Aleve intermittently with no significant help.  Not sure how often Tylenol is being taken.  Compliant with his other home medicines including amlodipine, aspirin, statin.  Complains of intermittent chest pain activity and at rest.  Sometimes when he is laying on his back.  No noted dyspnea on exertion.  No history of exertional chest pain.  No previous cardiac work-up that I know of.  Remote history of ablation in 2010.  Etiology/reason for the ablation not known.  Patient lastly complains of erectile dysfunction.  Requesting therapy.    Social history: Patient still married to his wife over 45 years ago they do not live together any longer.  He still sees her periodically.  Lives with her son and daughter-in-law    He has been evaluated for diabetes which she does not have.  No known history of atrial fibrillation though he did have an ablation in 2010.  There was concern for depression during his first visit but he appears much better at this time.  He arrived independently  today.  Had to utilize [place].    Assessment & Plan    ASSESSMENT AND PLAN    1. 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  May need to increase therapy based on medical guidelines with known stroke.   Latest Reference Range & Units 11/22/20 14:39   Chol/HDL Ratio 0.0 - 4.4  2.6   Cholesterol 100 - 200 mg/dL 572   HDL Cholesterol >=62 mg/dL 66   LDL Cholesterol 0.0 - 100.0 mg/dL 03.5   LDl/HDL Ratio  1.3   Triglycerides 0 - 149 mg/dL 96   VLDL 5.0 - 59.7 mg/dL 41.6      Orders:  -     atorvastatin (LIPITOR) 40 MG tablet; Take 1 tablet by mouth daily, Disp-90 tablet, R-3Normal  2. Colon cancer screening  -     [place]  3. Vasculogenic erectile dysfunction, unspecified vasculogenic erectile dysfunction type  4. Essential hypertension  Assessment & Plan:  No chest pain.  Blood pressure stable.  Initially mildly elevated but improved.  Continue current regimen.   5. Cerebrovascular accident (CVA), unspecified mechanism (HCC)  Assessment & Plan:  Continue risk reduction therapy.  Continue aspirin, statin, blood pressure management.  No history of diabetes.  Continuing rehab for noted deficits.  No longer smoking.  6. Gait instability  Assessment & Plan:  Stable.  Transitioning to outpatient therapy.   7. Low back pain without sciatica, unspecified back pain laterality, unspecified chronicity  Assessment & Plan:  Start daily meloxicam along with Tylenol.  Obtain a CT of the lumbar spine.  Consider referral to orthospine if any abnormalities.   Orders:  -     CT LUMBAR SPINE WO CONTRAST; Future  8. Chronic left shoulder pain  Assessment & Plan:  Acute on chronic shoulder pain secondary to being moved in rehab.  Previous documentation of left shoulder pain noted in care everywhere.  Full details not available.  Obtaining referral to orthopedic  surgery.   Orders:  -     [facility] - Enis Slipper DPM, Orthopa[facility]  9. Chest pain, unspecified type  Assessment & Plan:  Atypical in nature.  Seems less likely cardiac though at high risk secondary to known cerebrovascular accident.  Obtain a CT angiography of the coronaries.   Orders:  -     CTA CARDIAC W C STRC MORP W CONTRAST; Future  10. Chronic right-sided low back pain with right-sided sciatica  Assessment & Plan:  Start daily meloxicam along with Tylenol.  Obtain a CT of the lumbar spine.  Consider referral to orthospine if any abnormalities.      The diagnoses and plan were discussed with the patient, who verbalizes understanding and agrees with plan.  All questions answered.      Patient Instructions   Start Mobic 7.5 mg daily for your shoulder and back pain.  Continues Tylenol does milligrams twice a day for pain control.  Will complete paperwork regarding Telaride and mailed to your home next week.  Make sure you complete your portion.  Continue Norvasc and statin therapy.  Continue exercise prescribed by physical therapy at home.  Referral to orthopedic surgery being sent today.  Completing a CT scan of your lower back as well as a CT coronary angiogram to evaluate your chest pain with known history of vascular disease.      Active medications at end of Visit  Current Outpatient Medications   Medication Sig Dispense Refill    atorvastatin (LIPITOR) 40 MG tablet Take 1 tablet by mouth daily 90 tablet 3    meloxicam (MOBIC) 7.5 MG tablet Take 1 tablet by mouth daily 30 tablet 3    sildenafil (VIAGRA) 50 MG tablet Take 1 tablet by mouth as needed for Erectile Dysfunction 30 tablet 0    amLODIPine (NORVASC) 10 MG tablet 1 tablet Orally Once a day at bedtime      aspirin 325 MG EC tablet 1 tablet Orally Once a day for 30 day(s)      Iron-Vitamin C (VITRON-C) 65-125 MG TABS Take 1 tablet by mouth daily (Patient not taking: Reported on 01/18/2021)       No current facility-administered  medications for this visit.          Return in about 3 months (around 04/20/2021) for f/u stroke, low back pain, htn.       REVIEW OF SYSTEMS  Review of Systems   Constitutional:  Negative for appetite change, chills, fever and unexpected weight change.   HENT:  Negative for hearing loss and sore throat.    Eyes:  Negative for pain, discharge and visual disturbance (Blurred Vision).   Respiratory:  Negative for cough, shortness of breath and wheezing.    Cardiovascular:  Positive for chest pain. Negative for palpitations.   Gastrointestinal:  Positive for abdominal pain. Negative for diarrhea, nausea and vomiting.   Endocrine: Negative for cold intolerance, heat intolerance and polydipsia.   Genitourinary:  Positive for frequency. Negative for hematuria.   Musculoskeletal:  Negative for arthralgias.   Skin:  Negative for rash.   Neurological:  Positive for weakness. Negative for dizziness and syncope.   Hematological:  Negative for adenopathy.      PHYSICAL EXAMINATION  Vitals:    12/06/20 1402 01/18/21 1355 01/18/21 1426   BP: 123/79 (!) 142/92 134/87   Site:  Left Upper Arm    Position:  Sitting    Pulse: 87 82    SpO2: 98% 100%    Weight: 149 lb (67.6 kg) 156 lb (70.8 kg)    Height: 5\' 10"  (1.778 m) 5\' 10"  (1.778 m)      Body mass index is 22.38 kg/m??.    Physical Exam  Constitutional:       Appearance: Normal appearance. He is normal weight.   HENT:      Head: Normocephalic and atraumatic.      Right Ear: External ear normal.      Left Ear: External ear normal.      Nose: Nose normal.      Mouth/Throat:      Mouth: Mucous membranes are moist.      Pharynx: Oropharynx is clear.   Eyes:      Conjunctiva/sclera: Conjunctivae normal.      Pupils: Pupils are equal, round, and reactive to light.   Cardiovascular:      Rate and Rhythm: Normal rate and regular rhythm.      Pulses: Normal pulses.      Heart sounds: Normal heart sounds.   Pulmonary:      Effort: Pulmonary effort is normal.      Breath sounds: Normal  breath sounds.   Abdominal:      General: Bowel sounds are normal.      Palpations: Abdomen is soft.   Musculoskeletal:         General: Normal range of motion.      Cervical back: Normal range of motion and neck supple.   Skin:     General: Skin is warm and dry.      Capillary Refill: Capillary refill takes less than 2 seconds.   Neurological:      General: No focal deficit present.      Mental Status: He is alert and oriented to person, place, and time. Mental status is at baseline.   Psychiatric:         Mood and Affect: Mood normal.         Behavior: Behavior normal.         Thought Content: Thought content normal.         Judgment: Judgment normal.       [facility]  Current Outpatient Medications on File Prior to Visit   Medication Sig Dispense Refill    amLODIPine (NORVASC) 10 MG tablet 1 tablet Orally Once a day at bedtime      aspirin 325 MG EC tablet 1 tablet Orally Once a day for 30 day(s)      Iron-Vitamin C (VITRON-C) 65-125 MG TABS Take 1 tablet by mouth daily (Patient not taking: Reported on 01/18/2021)       No current facility-administered medications on file prior to visit.       ALLERGIES / INTOLERANCES  No Known Allergies    PERTINENT LABS AND  IMAGING  Lab Results   Component Value Date/Time    WBC 7.0 11/22/2020 02:39 PM    HGB 13.0 11/22/2020 02:39 PM    PLT 292 11/22/2020 02:39 PM    MCV 67.5 (L) 11/22/2020 02:39 PM

## 2021-01-19 ENCOUNTER — Encounter

## 2021-01-19 MED ORDER — METOPROLOL SUCCINATE ER 50 MG PO TB24
50 MG | ORAL_TABLET | Freq: Every day | ORAL | 0 refills | Status: DC
Start: 2021-01-19 — End: 2021-04-20

## 2021-01-19 NOTE — Progress Notes (Signed)
E-Scrpit sent

## 2021-01-19 NOTE — Telephone Encounter (Signed)
Tried to call patient. Patient is scheduled for August 30th at [facility] for 2:00 for his CTA Coronary Scan. Patient needs to be there by 1:30 PM. He needs to stop taking his Sildefanil 48 hours prior to the procedure. He should not have any caffeine the morning of proceddure. He can have clear liquids and his meds after 10:00 AM the day of procedure. Dr Nelson Chimes is sending in 2 tablets of metoprolol he should take a Metoprolol XL 50 MG Tablet 1 Dose at Bedtime the night before the procedure and then the other tablet 2 1/2 hours prior to scan so around 11:30 AM.

## 2021-01-22 NOTE — Telephone Encounter (Signed)
VOICEMAIL 2:21 pm     He was supposed to fill out some papers for him from [facility]    Please call back

## 2021-01-24 NOTE — Telephone Encounter (Signed)
Patient states that he went to his pharmacy to pick up his medications and the medication Metoprolol 50 mg with 2 tablets.      It states take one at bed time and one the procedure.     He states that he has never taken this medication, and he is not having a procedure. He would like a call back to discuss why this was prescribed for him.     He paid for it and thought it was a med he needed until he got home.    If he was scheduled for a procedure he does not know about it.

## 2021-01-24 NOTE — Telephone Encounter (Signed)
Yes he is scheduled for a CTA Coronary Scan August 30th. I called the patient and discussed the appt and the procedure plan.

## 2021-01-31 NOTE — Telephone Encounter (Signed)
Son would like a call back about setting up out patient therapy for his dad.

## 2021-02-01 NOTE — Telephone Encounter (Signed)
Patient son call his father need a referral for out patient physical therapy,he was discharge for inpatient physical therapy,advise pt son

## 2021-02-01 NOTE — Telephone Encounter (Signed)
I talked to the patient and gave him the info for [facility]. The referral was sent on 01/16/21.

## 2021-02-16 ENCOUNTER — Ambulatory Visit: Admit: 2021-02-16 | Discharge: 2021-02-16 | Payer: MEDICARE | Attending: Medical | Primary: Internal Medicine

## 2021-02-16 ENCOUNTER — Ambulatory Visit: Admit: 2021-02-16 | Discharge: 2021-02-16 | Payer: MEDICARE | Primary: Internal Medicine

## 2021-02-16 DIAGNOSIS — M25512 Pain in left shoulder: Secondary | ICD-10-CM

## 2021-02-16 MED ORDER — LIDOCAINE HCL 2 % IJ SOLN
2 % | Freq: Once | INTRAMUSCULAR | Status: AC
Start: 2021-02-16 — End: 2021-02-16
  Administered 2021-02-16: 15:00:00 1 mL via INTRA_ARTICULAR

## 2021-02-16 MED ORDER — METHYLPREDNISOLONE ACETATE 80 MG/ML IJ SUSP
80 MG/ML | Freq: Once | INTRAMUSCULAR | Status: AC
Start: 2021-02-16 — End: 2021-02-16
  Administered 2021-02-16: 15:00:00 80 mg via INTRA_ARTICULAR

## 2021-02-16 NOTE — Progress Notes (Signed)
ORTHOPAEDIC NEW PATIENT NOTE    Name: Michael Ho  Age: 65 y.o.  Date of Birth: March 11, 1956  Gender: male  MRN: [id number]    Chief Complaint   Patient presents with    Shoulder Pain     LT SHOULDER PAIN AFTER HAVING STROKE BACK IN MAY OF LAST YEAR . LIMITED ROM, FEELS HEAVY, SORENESS.        History of Present Illness:  Patient is a 65 y.o. male who comes in today for left shoulder pain.  He states that the pain started after he had a stroke in May of last year.  He has limited range of motion to the shoulder and feels like the shoulder is heavy and sore.  He denies any injury to this shoulder.  He has not had any recent x-rays of the shoulder.  He did physical therapy right after his stroke and his home and his primary care doctor has ordered more outpatient therapy but he has not started yet  Chief Complaint   Patient presents with    Shoulder Pain     LT SHOULDER PAIN AFTER HAVING STROKE BACK IN MAY OF LAST YEAR . LIMITED ROM, FEELS HEAVY, SORENESS.    Marland Kitchen Symptoms started    Review of Systems:    Constitutional - denies fevers, weight loss  Cardiovascular - denies chest pain, palpitations, peripheral edema  Respiratory - denies SOB, cough  Gastrointestinal - denies abdominal pain, nausea, vomiting  Genitourinary - denies dysuria, discharge  Musculoskeletal - per HPI  Integumentary - denies rash or lesions  Neurologic - denies numbness, tingling, paresthesias  Hematologic - denies abnormal bleeding    No Known Allergies     Current Outpatient Medications   Medication Sig Dispense Refill    metoprolol succinate (TOPROL XL) 50 MG extended release tablet Take 1 tablet by mouth daily for 2 days Take a Metoprolol XL 50 MG Tablet  1 Dose at Bedtime the night before the procedure and then the other tablet 2 1/2 hours prior to scan. 2 tablet 0    atorvastatin (LIPITOR) 40 MG tablet Take 1 tablet by mouth daily 90 tablet 3    meloxicam (MOBIC) 7.5 MG tablet Take 1 tablet by mouth daily 30 tablet 3    sildenafil (VIAGRA) 50 MG  tablet Take 1 tablet by mouth as needed for Erectile Dysfunction 30 tablet 0    Iron-Vitamin C (VITRON-C) 65-125 MG TABS Take 1 tablet by mouth daily (Patient not taking: Reported on 01/18/2021)      amLODIPine (NORVASC) 10 MG tablet 1 tablet Orally Once a day at bedtime      aspirin 325 MG EC tablet 1 tablet Orally Once a day for 30 day(s)       No current facility-administered medications for this visit.       Past Medical History:   Diagnosis Date    Hyperlipidemia     Hypertension     Hypertensive urgency 10/07/2019    Stroke [facility])         Past Surgical History:   Procedure Laterality Date    OTHER SURGICAL HISTORY  2010    Ablation       Family History   Problem Relation Age of Onset    No Known Problems Mother     Other Father         Heart Problems    No Known Problems Sister     No Known Problems Sister     No Known  Problems Sister     No Known Problems Sister     Brain Cancer Brother         In his 76s    No Known Problems Brother     Stroke Brother     No Known Problems Brother     No Known Problems Brother     No Known Problems Brother     No Known Problems Brother        Social History     Socioeconomic History    Marital status: Single     Spouse name: Not on file    Number of children: Not on file    Years of education: Not on file    Highest education level: Not on file   Occupational History    Not on file   Tobacco Use    Smoking status: Former     Packs/day: 0.50     Years: 53.00     Pack years: 26.50     Types: Cigarettes    Smokeless tobacco: Never    Tobacco comments:     Patient counselled on the dangers of tobacco 12/06/2020, Cessation: Not applicable   Vaping Use    Vaping Use: Never used   Substance and Sexual Activity    Alcohol use: Yes     Comment: Monthly or less    Drug use: Never    Sexual activity: Not on file   Other Topics Concern    Not on file   Social History Narrative    Not on file     Social Determinants of Health     Financial Resource Strain: Not on file   Food Insecurity: Not  on file   Transportation Needs: Not on file   Physical Activity: Not on file   Stress: Not on file   Social Connections: Not on file   Intimate Partner Violence: Not on file   Housing Stability: Not on file        There were no vitals filed for this visit.    Physical Exam:  General Examination:  HEAD: normocephalic, no scalp lesions.   EYES: BOTH EYES, normal.   EARS: BOTH EARS, normal.   THROAT: normal, clear, no erythema, no exudate.   HEART: normal, no murmurs, normal rate and regular rhythm.   LUNGS: normal, clear to auscultation bilaterally.   ABDOMEN: normal, no masses palpable, soft, nontender, nondistended, .   MUSCULOSKELETAL: cervical spine normal, lumbosacral spine normal, thoracic spine normal.   PSYCH: Patient is A&Ox3.     Musculoskeletal: Bilateral shoulder weakness with abduction.  He has pain in the left shoulder with abduction against resistance with thumbs up and thumbs down exam.  Positive Hawkins exam.  Negative belly press test.  He is able to reach behind his back/internal rotation but does have some discomfort with that.  He is able to reach overhead but has discomfort past 90 degrees.  He has equal weakness to bilateral upper extremities.    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  He has an osteophyte in the ear if inferior portion of the glenoid.  He also has some bony changes at the greater tuberosity and osteophyte formation at the acromioclavicular joint.         Assessment & Plan:  Diagnoses  and all orders for this visit:  Left shoulder pain, unspecified chronicity  -     XR SHOULDER LEFT (MIN 2 VIEWS) (CLINIC PERFORMED)  -     DRAIN/INJECT LARGE JOINT/BURSA  -     methylPREDNISolone acetate (DEPO-MEDROL) injection 80 mg  -     lidocaine 2 % injection 1 mL  -     [facility] - [facility] - [address], [employer]  Right shoulder pain, unspecified chronicity  -     [facility] - [facility] - [address], [place]     We obtained x-rays today in the office.  He does have some bony changes to the shoulder but has adequate joint space.  We discussed weakness after strokes and, and shoulder pain associated after strokes.  I would like him to have physical therapy to work on strengthening of his upper extremities.  I offered him an injection into the subacromial space today which she consented to.  The left shoulder was sterilely prepped with chlorhexidine and injected into the subacromial space with 2 cc of Depo-Medrol and 2 cc of 1% lidocaine.  Patient tolerated procedure well.  Sterile dressings were applied post procedure.  I explained to him that if he does not get adequate relief from the injection and some improvement with physical therapy then we will get an MRI of the shoulder.    Patient voiced accordance and understanding of the information provided and the formulated plan. All questions were answered to the patient's satisfaction during the encounter.      Antonietta Jewel, PA   Orthopaedic Surgery

## 2021-03-05 DIAGNOSIS — L218 Other seborrheic dermatitis: Secondary | ICD-10-CM | POA: Diagnosis not present

## 2021-03-05 DIAGNOSIS — L0103 Bullous impetigo: Secondary | ICD-10-CM | POA: Diagnosis not present

## 2021-03-16 ENCOUNTER — Ambulatory Visit: Admit: 2021-03-16 | Discharge: 2021-03-16 | Payer: MEDICARE | Attending: Medical | Primary: Internal Medicine

## 2021-03-16 DIAGNOSIS — M25512 Pain in left shoulder: Secondary | ICD-10-CM

## 2021-03-16 MED ORDER — METHYLPREDNISOLONE ACETATE 40 MG/ML IJ SUSP
40 MG/ML | Freq: Once | INTRAMUSCULAR | Status: AC
Start: 2021-03-16 — End: 2021-03-16
  Administered 2021-03-16: 16:00:00 80 mg via INTRA_ARTICULAR

## 2021-03-16 MED ORDER — LIDOCAINE HCL 1 % IJ SOLN
1 % | Freq: Once | INTRAMUSCULAR | Status: AC
Start: 2021-03-16 — End: 2021-03-16
  Administered 2021-03-16: 16:00:00 2 mL via INTRA_ARTICULAR

## 2021-03-16 NOTE — Progress Notes (Signed)
Orthopaedic Clinic Note    Name: Michael Ho  Age: 65 y.o.  Date of Birth: 01-24-1956  Gender: male  MRN: [id number]    Chief Complaint   Patient presents with    Shoulder Pain     Pt states that the previous injection worked for a couple of weeks but the pain has returned. Is having some tightness and heaviness in the Lt shoulder.         Follow up visit:   1. Left shoulder pain, unspecified chronicity        HPI: Patient returns today for follow-up of left shoulder pain.  His pain started after he had a stroke in May of last year.  I last saw him a month ago and gave him a cortisone injection into the subacromial space.  This injection helped his pain quite a bit and he comes in today requesting another injection to put him over the edge.  He had limited range of motion to the shoulder and felt like the shoulder was heavy and sore. He has noticed some improvement since the injection, but continues to have pain. He denies any injury to this shoulder.  We ordered PT for him last visit. He said he's been going. He would like PT ordered for leg strengthening and to help him with his gait    Review of Systems:  Patient complaints of left shoulder pain    All other systems reviewed and are negative      Physical Examination:  General: Awake and alert.  HEENT: Normocephalic, atraumatic  CV/Pulm: Breathing even and unlabored  Skin: No obvious rashes noted.  Lymphatic: No obvious evidence of lymphedema or lymphadenopathy  Physical Exam     Musculoskeletal Examination:  Examination on the left shoulder.  His range of motion has improved since last visit but he still has significant weakness with abduction and pain with abduction against resistance with thumbs up and thumbs down exam.  Positive Hawkins exam.  Negative belly press test.  Imaging / Electrodiagnostic Tests:   Xray Result (most recent):  XR SHOULDER LEFT (MIN 2 VIEWS) 02/16/2021    Narrative  Three-view x-rays of the left shoulder show some osteoarthritic  changes with osteophyte formation.  He has an osteophyte in the ear if inferior portion of the glenoid.  He also has some bony changes at the greater tuberosity and osteophyte formation at the acromioclavicular joint.         Assessment:   1. Left shoulder pain, unspecified chronicity  -     DRAIN/INJECT LARGE JOINT/BURSA  -     lidocaine 1 % injection 2 mL; 2 mL, Intra-artICUlar, ONCE, 1 dose, On Fri 03/16/21 at 1230  -     methylPREDNISolone acetate (DEPO-MEDROL) injection 80 mg; 80 mg, Intra-artICUlar, ONCE, 1 dose, On Fri 03/16/21 at 1230  -     MRI SHOULDER LEFT WO CONTRAST; Future  -     [facility] - [facility] - [address], [employer]       Plan: I explained to him that with the rapid return of his pain then I am concerned for his rotator cuff.  We will order an MRI of the shoulder.  He is to continue with physical therapy in the meantime.  We will order additional physical therapy for leg strengthening and I explained to him that he may need to contact his primary care doctor to order this as well.  He would like a cortisone injection again into the  shoulder for attempted pain relief.  I explained to him that we will continue to do this but could do it this 1 time.  The left shoulder was sterilely prepped with chlorhexidine and injected with 2cc of depo-medrol and 2cc of 1% Lidocaine into the subacromial space. The patient tolerated the procedure well. Sterile dressings were applied post-procedure.      Diagnoses and all orders for this visit:  Left shoulder pain, unspecified chronicity  -     DRAIN/INJECT LARGE JOINT/BURSA  -     lidocaine 1 % injection 2 mL  -     methylPREDNISolone acetate (DEPO-MEDROL) injection 80 mg  -     MRI SHOULDER LEFT WO CONTRAST; Future  -     [facility] - [facility] - [address], [place]       Antonietta Jewel, [place]   Orthopaedic Surgery

## 2021-03-20 DIAGNOSIS — M4727 Other spondylosis with radiculopathy, lumbosacral region: Secondary | ICD-10-CM | POA: Diagnosis not present

## 2021-03-20 DIAGNOSIS — Z79891 Long term (current) use of opiate analgesic: Secondary | ICD-10-CM | POA: Diagnosis not present

## 2021-03-20 DIAGNOSIS — Z5181 Encounter for therapeutic drug level monitoring: Secondary | ICD-10-CM | POA: Diagnosis not present

## 2021-03-20 DIAGNOSIS — M542 Cervicalgia: Secondary | ICD-10-CM | POA: Diagnosis not present

## 2021-03-20 DIAGNOSIS — G8929 Other chronic pain: Secondary | ICD-10-CM | POA: Diagnosis not present

## 2021-03-20 DIAGNOSIS — M961 Postlaminectomy syndrome, not elsewhere classified: Secondary | ICD-10-CM | POA: Diagnosis not present

## 2021-03-21 DIAGNOSIS — Z08 Encounter for follow-up examination after completed treatment for malignant neoplasm: Secondary | ICD-10-CM | POA: Diagnosis not present

## 2021-03-21 DIAGNOSIS — L0103 Bullous impetigo: Secondary | ICD-10-CM | POA: Diagnosis not present

## 2021-03-21 DIAGNOSIS — Z85828 Personal history of other malignant neoplasm of skin: Secondary | ICD-10-CM | POA: Diagnosis not present

## 2021-03-21 DIAGNOSIS — D492 Neoplasm of unspecified behavior of bone, soft tissue, and skin: Secondary | ICD-10-CM | POA: Diagnosis not present

## 2021-03-21 DIAGNOSIS — L218 Other seborrheic dermatitis: Secondary | ICD-10-CM | POA: Diagnosis not present

## 2021-03-21 DIAGNOSIS — C44319 Basal cell carcinoma of skin of other parts of face: Secondary | ICD-10-CM | POA: Diagnosis not present

## 2021-03-29 NOTE — Other (Signed)
Updated.

## 2021-04-02 DIAGNOSIS — J069 Acute upper respiratory infection, unspecified: Secondary | ICD-10-CM | POA: Diagnosis not present

## 2021-04-02 DIAGNOSIS — L139 Bullous disorder, unspecified: Secondary | ICD-10-CM | POA: Diagnosis not present

## 2021-04-09 ENCOUNTER — Inpatient Hospital Stay: Admit: 2021-04-09 | Payer: MEDICARE | Primary: Internal Medicine

## 2021-04-09 DIAGNOSIS — M62512 Muscle wasting and atrophy, not elsewhere classified, left shoulder: Secondary | ICD-10-CM

## 2021-04-20 ENCOUNTER — Encounter

## 2021-04-20 ENCOUNTER — Ambulatory Visit: Admit: 2021-04-20 | Discharge: 2021-04-20 | Payer: MEDICARE | Attending: Internal Medicine | Primary: Internal Medicine

## 2021-04-20 DIAGNOSIS — I10 Essential (primary) hypertension: Secondary | ICD-10-CM

## 2021-04-20 LAB — CBC
Hematocrit: 42.1 % (ref 38.0–52.0)
Hemoglobin: 13 g/dL (ref 13.0–17.3)
MCH: 20.8 pg — ABNORMAL LOW (ref 27.0–34.5)
MCHC: 30.9 g/dL — ABNORMAL LOW (ref 32.0–36.0)
MCV: 67.4 fL — ABNORMAL LOW (ref 84.0–100.0)
MPV: 10.3 fL (ref 7.2–13.2)
NRBC Absolute: 0 10*3/uL (ref 0.000–0.012)
NRBC Automated: 0 % (ref 0.0–0.2)
Platelets: 303 10*3/uL (ref 140–440)
RBC: 6.25 x10e6/mcL — ABNORMAL HIGH (ref 4.00–5.60)
RDW: 22.3 % — ABNORMAL HIGH (ref 11.0–16.0)
WBC: 7.2 10*3/uL (ref 3.8–10.6)

## 2021-04-20 LAB — HEMOGLOBIN A1C
Est. Avg. Glucose, WB: 105
Est. Avg. Glucose-calculated: 111
Hemoglobin A1C: 5.3 % (ref 4.0–6.0)

## 2021-04-20 MED ORDER — MELOXICAM 15 MG PO TABS
15 MG | ORAL_TABLET | Freq: Every day | ORAL | 0 refills | Status: AC
Start: 2021-04-20 — End: 2021-07-19

## 2021-04-20 MED ORDER — AMLODIPINE BESYLATE 10 MG PO TABS
10 MG | ORAL_TABLET | Freq: Every day | ORAL | 3 refills | Status: DC
Start: 2021-04-20 — End: 2021-08-24

## 2021-04-20 NOTE — Progress Notes (Signed)
Chief Complaint  Chief Complaint   Patient presents with    Follow-up       HISTORY OF PRESENT ILLNESS  65 y.o. male presents today for follow-up on: Month follow-up.  States that he gets stiff easily typically in the morning.  He is currently going to therapy on Tuesday Thursday Friday.  Patient has had pain in his left shoulder that is acute on chronic.  He is seeing Ortho and they have done shots in his left shoulder.  Denies any chest pain shortness of breath nausea vomiting.  Still frustrated about his stroke.  Still confused about hepatitis C antibody.  We discussed potentially having having chronic hepatitis C.  We discussed causes.  Discussed treatment options.  HPI   Assessment & Plan    ASSESSMENT AND PLAN    1. Essential hypertension  Assessment & Plan:  Blood pressure currently controlled continuing amlodipine.   Orders:  -     amLODIPine (NORVASC) 10 MG tablet; Take 1 tablet by mouth daily, Disp-90 tablet, R-3Normal  2. Dyslipidemia with elevated low density lipoprotein (LDL) cholesterol and abnormally low high density lipoprotein (HDL) cholesterol  Assessment & Plan:  Continuing statin therapy.   Orders:  -     Lipid Panel; Future  -     CK; Future  -     Comprehensive Metabolic Panel; Future  3. Chronic left shoulder pain  -     [facility] - Rooker, Luis Abed PA, [facility] [employer]  4. Chronic hepatitis C without hepatic coma (HCC)  Assessment & Plan:  Obtaining hepatitis C antibody and quantitation reflex.  Will refer to infectious disease if positive   Orders:  -     Hepatitis C Ab, Rflx To Qt By Pcr; Future  5. Hyperglycemia  -     Hemoglobin A1C; Future  6. Microcytic anemia  -     CBC; Future  7. 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  Continue aspirin, blood pressure control, statin      Reviewed your records from [place].  You were previously diagnosed with hepatitis C and active  infection.  It does not appear you have been treated for this on repeating your laboratory data making a referral for treatment to infectious disease or gastroenterology.  We will call you with an updated referral.  Obtain your lab data today.  Increasing her Mobic from 7.5 mg to 15 mg a day to help with pain control.  Follow-up with orthopedic surgery referrals been placed again.  Your weight has fluctuated between 149 pounds to up to 156 and now down to 152.  We will continue to monitor.  We will have you return in 4 weeks to discuss her hepatitis C in more detail.  Recommend no alcohol while we are evaluating and treating hepatitis C.  The diagnoses and plan were discussed with the patient, who verbalizes understanding and agrees with plan.  All questions answered.      There are no Patient Instructions on file for this visit.    Active medications at end of Visit  Current Outpatient Medications   Medication Sig Dispense Refill    metoprolol succinate (TOPROL XL) 50 MG extended release tablet Take 1 tablet by mouth daily for 2 days Take a Metoprolol XL 50 MG Tablet  1 Dose at Bedtime the night before the procedure and then the other tablet 2 1/2 hours prior  to scan. 2 tablet 0    atorvastatin (LIPITOR) 40 MG tablet Take 1 tablet by mouth daily 90 tablet 3    meloxicam (MOBIC) 7.5 MG tablet Take 1 tablet by mouth daily 30 tablet 3    sildenafil (VIAGRA) 50 MG tablet Take 1 tablet by mouth as needed for Erectile Dysfunction 30 tablet 0    Iron-Vitamin C (VITRON-C) 65-125 MG TABS Take 1 tablet by mouth daily (Patient not taking: Reported on 01/18/2021)      amLODIPine (NORVASC) 10 MG tablet 1 tablet Orally Once a day at bedtime      aspirin 325 MG EC tablet 1 tablet Orally Once a day for 30 day(s)       No current facility-administered medications for this visit.          No follow-ups on file.       REVIEW OF SYSTEMS  Review of Systems   Constitutional:  Positive for unexpected weight change. Negative for appetite  change, chills and fever.   HENT:  Negative for hearing loss and sore throat.    Eyes:  Negative for pain, discharge and visual disturbance (Blurred Vision).   Respiratory:  Negative for cough, shortness of breath and wheezing.    Cardiovascular:  Negative for chest pain and palpitations.   Gastrointestinal:  Negative for abdominal pain, diarrhea, nausea and vomiting.   Endocrine: Negative for cold intolerance, heat intolerance and polydipsia.   Genitourinary:  Negative for frequency and hematuria.   Musculoskeletal:  Negative for arthralgias.   Skin:  Negative for rash.   Neurological:  Negative for dizziness, syncope and weakness.   Hematological:  Negative for adenopathy.      PHYSICAL EXAMINATION  Vitals:    04/20/21 1006   BP: 130/78   Pulse: 63   Weight: 152 lb 12.8 oz (69.3 kg)     Body mass index is 21.92 kg/m??.    Physical Exam  Constitutional:       Appearance: Normal appearance. He is normal weight.   HENT:      Head: Normocephalic and atraumatic.      Right Ear: External ear normal.      Left Ear: External ear normal.      Nose: Nose normal.      Mouth/Throat:      Mouth: Mucous membranes are moist.      Pharynx: Oropharynx is clear.   Eyes:      Conjunctiva/sclera: Conjunctivae normal.      Pupils: Pupils are equal, round, and reactive to light.   Cardiovascular:      Rate and Rhythm: Normal rate and regular rhythm.      Pulses: Normal pulses.      Heart sounds: Normal heart sounds.   Pulmonary:      Effort: Pulmonary effort is normal.      Breath sounds: Normal breath sounds.   Abdominal:      General: Bowel sounds are normal.      Palpations: Abdomen is soft.   Musculoskeletal:         General: Normal range of motion.      Cervical back: Normal range of motion and neck supple.   Skin:     General: Skin is warm and dry.      Capillary Refill: Capillary refill takes less than 2 seconds.   Neurological:      General: No focal deficit present.      Mental Status: He is alert and oriented to person, place,  and time. Mental status is at baseline.   Psychiatric:         Mood and Affect: Mood normal.         Behavior: Behavior normal.         Thought Content: Thought content normal.         Judgment: Judgment normal.       MEDICATIONS at [facility] of Visit  Current Outpatient Medications on File Prior to Visit   Medication Sig Dispense Refill    metoprolol succinate (TOPROL XL) 50 MG extended release tablet Take 1 tablet by mouth daily for 2 days Take a Metoprolol XL 50 MG Tablet  1 Dose at Bedtime the night before the procedure and then the other tablet 2 1/2 hours prior to scan. 2 tablet 0    atorvastatin (LIPITOR) 40 MG tablet Take 1 tablet by mouth daily 90 tablet 3    meloxicam (MOBIC) 7.5 MG tablet Take 1 tablet by mouth daily 30 tablet 3    sildenafil (VIAGRA) 50 MG tablet Take 1 tablet by mouth as needed for Erectile Dysfunction 30 tablet 0    Iron-Vitamin C (VITRON-C) 65-125 MG TABS Take 1 tablet by mouth daily (Patient not taking: Reported on 01/18/2021)      amLODIPine (NORVASC) 10 MG tablet 1 tablet Orally Once a day at bedtime      aspirin 325 MG EC tablet 1 tablet Orally Once a day for 30 day(s)       No current facility-administered medications on file prior to visit.       ALLERGIES / INTOLERANCES  No Known Allergies    PERTINENT LABS AND IMAGING  Lab Results   Component Value Date/Time    WBC 7.0 11/22/2020 02:39 PM    HGB 13.0 11/22/2020 02:39 PM    PLT 292 11/22/2020 02:39 PM    MCV 67.5 (L) 11/22/2020 02:39 PM

## 2021-04-20 NOTE — Patient Instructions (Signed)
Reviewed your records from [place].  You were previously diagnosed with hepatitis C and active infection.  It does not appear you have been treated for this on repeating your laboratory data making a referral for treatment to infectious disease or gastroenterology.  We will call you with an updated referral.  Obtain your lab data today.  Increasing her Mobic from 7.5 mg to 15 mg a day to help with pain control.  Follow-up with orthopedic surgery referrals been placed again.  Your weight has fluctuated between 149 pounds to up to 156 and now down to 152.  We will continue to monitor.  We will have you return in 4 weeks to discuss her hepatitis C in more detail.  Recommend no alcohol while we are evaluating and treating hepatitis C.

## 2021-04-23 ENCOUNTER — Encounter

## 2021-04-23 NOTE — Other (Signed)
CBC and A1c within normal limits.  Awaiting confirmatory hepatitis C and liver enzyme testing.  He will need referral for infectious disease for treatment of hepatitis C based on positive testing noted in care everywhere.  Please refer to Dr. Lelan Pons Infectious disease.  Jones Broom, MD 8:51 AM.04/23/2021

## 2021-04-24 LAB — HEPATITIS C AB, RFLX TO QT BY PCR: HCV Ab: >11 s/co ratio — ABNORMAL HIGH (ref 0.0–0.9)

## 2021-04-30 LAB — HCV RT-PCR QUANT (NON-GRAPH)
HCV log10: 5.589
Hepatitis C Quantitation: 388000

## 2021-05-02 DIAGNOSIS — E1169 Type 2 diabetes mellitus with other specified complication: Secondary | ICD-10-CM | POA: Diagnosis not present

## 2021-05-02 DIAGNOSIS — E785 Hyperlipidemia, unspecified: Secondary | ICD-10-CM | POA: Diagnosis not present

## 2021-05-02 DIAGNOSIS — J439 Emphysema, unspecified: Secondary | ICD-10-CM | POA: Diagnosis not present

## 2021-05-02 DIAGNOSIS — Z139 Encounter for screening, unspecified: Secondary | ICD-10-CM | POA: Diagnosis not present

## 2021-05-02 DIAGNOSIS — I10 Essential (primary) hypertension: Secondary | ICD-10-CM | POA: Diagnosis not present

## 2021-05-02 DIAGNOSIS — R059 Cough, unspecified: Secondary | ICD-10-CM | POA: Diagnosis not present

## 2021-05-03 NOTE — Other (Signed)
Please update the patient that I retested him for hepatitis C and he does have an active viral load/infection.  Referral placed to infectious disease for evaluation and treatment.  Please confirm that he has appointment factious disease-Dr. Ronne Binning.    Jones Broom, MD 5:29 AM.05/03/2021

## 2021-05-22 ENCOUNTER — Ambulatory Visit: Admit: 2021-05-22 | Discharge: 2021-05-22 | Payer: MEDICARE | Attending: Internal Medicine | Primary: Internal Medicine

## 2021-05-22 ENCOUNTER — Encounter

## 2021-05-22 DIAGNOSIS — M5441 Lumbago with sciatica, right side: Secondary | ICD-10-CM

## 2021-05-22 NOTE — Assessment & Plan Note (Signed)
Blood pressure initially low.  Now stable.  Currently only on Norvasc and blood pressure is currently stable/controlled.  We will continue to monitor.  Previously had to be on hydrochlorothiazide/lisinopril as well.

## 2021-05-22 NOTE — Progress Notes (Signed)
Chief Complaint  Chief Complaint   Patient presents with    Follow-up     HISTORY OF PRESENT ILLNESS  65 y.o. male presents today for follow-up on: Marland Kitchen  Patient presents for follow-up on his hepatitis C diagnosis.  He has followed up with infectious disease since he last saw me.  They are starting him on Mavyret.  I have confirmed with [facility] this prescription has been sent in and they are currently working on prior authorization.  We also discussed his Mobic.  He still been taking 7/2 mg daily.  I recommended he go increase to 15mg  to improve his symptoms.  I confirmed with [facility] they have the prescription unfortunate was initially sent to [address] in [facility] [facility] not in [place] that has been corrected at this time.  I have updated his pharmacy in the computer system as well.  Overall patient states he is doing better.  Patient was extremely concerned about his hepatitis C diagnosis.  He was concerned about how he would have gotten it.  We discussed that during his last visit as well during this visit.  I cannot tell him how long he has had it but I cannot tell him it is treatable at this time which we discussed.  He is in better acceptance of this and reassured that the cure rate is quite high with treatment.  I did discuss with him that he could have headaches and fatigue while he is on the medicine for the next 8 weeks.  Repeat blood pressure was checked and was 116/75.     Assessment & Plan    ASSESSMENT AND PLAN    1. Chronic right-sided low back pain with right-sided sciatica  Assessment & Plan:  Increasing meloxicam to 15 mg daily.  We will continue monitor kidney function.  Overall symptoms seem to have improved slightly.  2. Mixed hyperlipidemia  3. Solitary pulmonary nodule  Assessment & Plan:  Lung cancer screening CT was completed in July 2021.  Less than a 4 mm nodule was noted but it does not clarify where it was identified.  Repeat scan in July 2023.   4. Chronic hepatitis C  without hepatic coma (HCC)  Assessment & Plan:  If she is noted positive hepatitis C screening in August 2023 when I reviewed the records.  Repeat testing confirmed positive hepatitis C with continued active infection.  Genotype Ia documented in the past.  Patient is under treatment with infectious disease.  Starting Mavyret.   5. Dyslipidemia with elevated low density lipoprotein (LDL) cholesterol and abnormally low high density lipoprotein (HDL) cholesterol  Assessment & Plan:  Lipids stable on Lipitor 40.  Repeat labs in 3 months with follow-up.   6. Essential hypertension  Assessment & Plan:  Blood pressure initially low.  Now stable.  Currently only on Norvasc and blood pressure is currently stable/controlled.  We will continue to monitor.  Previously had to be on hydrochlorothiazide/lisinopril as well.    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  All questions answered.      Patient Instructions   Continue Mobic 15 mg daily.  We will have you return in 3 months for follow-up.  Obtain your laboratory data prior to your 74-month follow-up visit.  We will recheck your lipids, kidney function, liver enzymes. Your hemoglobin has been stable now.  Continue your blood pressure medicine.  Complete treatment for hepatitis C as ordered by infectious disease.    Active medications at end of Visit  Current Outpatient Medications   Medication Sig Dispense Refill    amLODIPine (NORVASC) 10 MG tablet Take 1 tablet by mouth daily 90 tablet 3    meloxicam (MOBIC) 15 MG tablet Take 1 tablet by mouth daily 90 tablet 0    atorvastatin (LIPITOR) 40 MG tablet Take 1 tablet by mouth daily 90 tablet 3    aspirin 325 MG EC tablet 1 tablet Orally Once a day for 30 day(s)      Iron-Vitamin C (VITRON-C) 65-125 MG TABS Take 1 tablet by mouth daily (Patient not taking: Reported on 05/22/2021)       No current facility-administered medications  for this visit.          Return in about 3 months (around 08/20/2021) for routine f/u.       REVIEW OF SYSTEMS  Review of Systems   Constitutional:  Negative for appetite change, chills, fever and unexpected weight change.   HENT:  Negative for hearing loss and sore throat.    Eyes:  Negative for pain, discharge and visual disturbance (Blurred Vision).   Respiratory:  Negative for cough, shortness of breath and wheezing.    Cardiovascular:  Negative for chest pain and palpitations.   Gastrointestinal:  Negative for abdominal pain, diarrhea, nausea and vomiting.   Endocrine: Negative for cold intolerance, heat intolerance and polydipsia.   Genitourinary:  Negative for frequency and hematuria.   Musculoskeletal:  Negative for arthralgias.   Skin:  Negative for rash.   Neurological:  Positive for weakness. Negative for dizziness and syncope.   Hematological:  Negative for adenopathy.      PHYSICAL EXAMINATION  Vitals:    05/22/21 1057   BP: 116/75   Pulse: 90   SpO2: 98%   Weight: 157 lb (71.2 kg)   Height: 5\' 10"  (1.778 m)     Body mass index is 22.53 kg/m??.    Physical Exam  Constitutional:       Appearance: Normal appearance. He is normal weight.   HENT:      Head: Normocephalic and atraumatic.      Right Ear: External ear normal.      Left Ear: External ear normal.      Nose: Nose normal.      Mouth/Throat:      Mouth: Mucous membranes are moist.      Pharynx: Oropharynx is clear.   Eyes:      Conjunctiva/sclera: Conjunctivae normal.      Pupils: Pupils are equal, round, and reactive to light.   Cardiovascular:      Rate and Rhythm: Normal rate and regular rhythm.      Pulses: Normal pulses.      Heart sounds: Normal heart sounds.   Pulmonary:      Effort: Pulmonary effort is normal.      Breath sounds: Normal breath sounds.   Abdominal:      General: Bowel sounds are normal.      Palpations: Abdomen is soft.   Musculoskeletal:         General: Normal range of motion.      Cervical back: Normal range of motion and  neck supple.   Skin:     General: Skin is warm and dry.      Capillary Refill: Capillary refill takes less than 2 seconds.   Neurological:      General: No  focal deficit present.      Mental Status: He is alert and oriented to person, place, and time. Mental status is at baseline.   Psychiatric:         Mood and Affect: Mood normal.         Behavior: Behavior normal.         Thought Content: Thought content normal.         Judgment: Judgment normal.       MEDICATIONS at [facility] of Visit  Current Outpatient Medications on File Prior to Visit   Medication Sig Dispense Refill    amLODIPine (NORVASC) 10 MG tablet Take 1 tablet by mouth daily 90 tablet 3    meloxicam (MOBIC) 15 MG tablet Take 1 tablet by mouth daily 90 tablet 0    atorvastatin (LIPITOR) 40 MG tablet Take 1 tablet by mouth daily 90 tablet 3    aspirin 325 MG EC tablet 1 tablet Orally Once a day for 30 day(s)      Iron-Vitamin C (VITRON-C) 65-125 MG TABS Take 1 tablet by mouth daily (Patient not taking: Reported on 05/22/2021)       No current facility-administered medications on file prior to visit.       ALLERGIES / INTOLERANCES  No Known Allergies    PERTINENT LABS AND IMAGING  Lab Results   Component Value Date/Time    WBC 7.2 04/20/2021 12:46 PM    HGB 13.0 04/20/2021 12:46 PM    PLT 303 04/20/2021 12:46 PM    MCV 67.4 (L) 04/20/2021 12:46 PM

## 2021-05-22 NOTE — Assessment & Plan Note (Signed)
Lung cancer screening CT was completed in July 2021.  Less than a 4 mm nodule was noted but it does not clarify where it was identified.  Repeat scan in July 2023.

## 2021-05-22 NOTE — Assessment & Plan Note (Signed)
Increasing meloxicam to 15 mg daily.  We will continue monitor kidney function.  Overall symptoms seem to have improved slightly.

## 2021-05-22 NOTE — Patient Instructions (Addendum)
Continue Mobic 15 mg daily.  We will have you return in 3 months for follow-up.  Obtain your laboratory data prior to your 80-month follow-up visit.  We will recheck your lipids, kidney function, liver enzymes. Your hemoglobin has been stable now.  Continue your blood pressure medicine.  Complete treatment for hepatitis C as ordered by infectious disease.

## 2021-05-22 NOTE — Assessment & Plan Note (Signed)
If she is noted positive hepatitis C screening in [place] when I reviewed the records.  Repeat testing confirmed positive hepatitis C with continued active infection.  Genotype Ia documented in the past.  Patient is under treatment with infectious disease.  Starting Mavyret.

## 2021-05-22 NOTE — Assessment & Plan Note (Signed)
Lipids stable on Lipitor 40.  Repeat labs in 3 months with follow-up.

## 2021-06-08 ENCOUNTER — Encounter

## 2021-06-08 MED ORDER — ATORVASTATIN CALCIUM 40 MG PO TABS
40 MG | ORAL_TABLET | Freq: Every day | ORAL | 3 refills | Status: AC
Start: 2021-06-08 — End: 2022-08-07

## 2021-06-12 DIAGNOSIS — M961 Postlaminectomy syndrome, not elsewhere classified: Secondary | ICD-10-CM | POA: Diagnosis not present

## 2021-06-12 DIAGNOSIS — Z5181 Encounter for therapeutic drug level monitoring: Secondary | ICD-10-CM | POA: Diagnosis not present

## 2021-06-12 DIAGNOSIS — M542 Cervicalgia: Secondary | ICD-10-CM | POA: Diagnosis not present

## 2021-06-12 DIAGNOSIS — M4727 Other spondylosis with radiculopathy, lumbosacral region: Secondary | ICD-10-CM | POA: Diagnosis not present

## 2021-06-12 DIAGNOSIS — Z79891 Long term (current) use of opiate analgesic: Secondary | ICD-10-CM | POA: Diagnosis not present

## 2021-06-12 DIAGNOSIS — G8929 Other chronic pain: Secondary | ICD-10-CM | POA: Diagnosis not present

## 2021-06-12 DIAGNOSIS — R030 Elevated blood-pressure reading, without diagnosis of hypertension: Secondary | ICD-10-CM | POA: Diagnosis not present

## 2021-06-12 DIAGNOSIS — Z720 Tobacco use: Secondary | ICD-10-CM | POA: Diagnosis not present

## 2021-07-09 ENCOUNTER — Telehealth

## 2021-07-09 NOTE — Telephone Encounter (Signed)
Spoke to the patients  [facility] who states that the patient needs to see a Podiatrist    The Doctors name  at [employer] and the fax number is  [phone] ext [phone]    The patient was on the line with [place] with a 3 way call and prefers that the office calls Judeth Cornfield back     The patient has requested that the referral be set up as soon as possible    Sent to [employer]

## 2021-07-09 NOTE — Telephone Encounter (Signed)
Referral sent.

## 2021-07-11 NOTE — Telephone Encounter (Signed)
I spoke to [place] about the referal to podiatry      addressed

## 2021-07-11 NOTE — Telephone Encounter (Signed)
I tried calling the number twice, and both times the dialer picked up but did not say anything. I was trying to get in contact with this patient about his appt that is scheduled with Dr. Sofie Hartigan on 07/12/21. We did not get any information on what the appt is for and are trying to plan accordingly. If patient calls back please collect information about the appt. If it is anything regarding nail trimming Dr. Sofie Hartigan is not taking new [facility] patients at this time.

## 2021-07-12 ENCOUNTER — Encounter: Attending: Foot Surgery | Primary: Internal Medicine

## 2021-07-16 NOTE — Telephone Encounter (Signed)
This patient did not have their call reconnected with me, however it appears that the appt was cancelled and the note lists that it was because they were seeking routine foot care, which isn't something Dr. Sofie Hartigan is taking any more of at this time.

## 2021-07-18 ENCOUNTER — Encounter

## 2021-07-23 ENCOUNTER — Encounter

## 2021-07-23 MED ORDER — MELOXICAM 15 MG PO TABS
15 MG | ORAL_TABLET | Freq: Every day | ORAL | 3 refills | Status: AC
Start: 2021-07-23 — End: 2021-10-21

## 2021-07-25 ENCOUNTER — Telehealth

## 2021-07-25 NOTE — Telephone Encounter (Signed)
Patient states that he was discharged from [facility]. Patient will like a referral to continue care. Please call and advise.       Sent to B. Carylon Perches

## 2021-07-25 NOTE — Telephone Encounter (Signed)
This is a duplicate see other TE.

## 2021-07-25 NOTE — Telephone Encounter (Signed)
Pt states he was taking Physical Therapy at [facility]  Pt states they released him from Physical Therapy & he states he thinks he needs to continue his Physical Therapy    Pt states they told him that he needs to get authorization from his doctor's office to continue the physical therapy    Sent to Junious Dresser

## 2021-07-26 NOTE — Telephone Encounter (Signed)
Attempted to call pt and vm box full.

## 2021-07-26 NOTE — Telephone Encounter (Signed)
Referral sent for outpatient [facility] physical therapy.

## 2021-08-01 ENCOUNTER — Inpatient Hospital Stay: Admit: 2021-08-01 | Payer: MEDICARE | Primary: Internal Medicine

## 2021-08-06 NOTE — Assessment & Plan Note (Signed)
Continue to medically optimize to prevent further strokes.  Continue aspirin, blood pressure control, statin

## 2021-08-06 NOTE — Assessment & Plan Note (Signed)
Blood pressure currently controlled continuing amlodipine.

## 2021-08-06 NOTE — Assessment & Plan Note (Signed)
Continuing statin therapy.

## 2021-08-06 NOTE — Assessment & Plan Note (Signed)
Obtaining hepatitis C antibody and quantitation reflex.  Will refer to infectious disease if positive

## 2021-08-22 ENCOUNTER — Encounter

## 2021-08-22 ENCOUNTER — Encounter: Payer: MEDICAID | Attending: Internal Medicine | Primary: Internal Medicine

## 2021-08-23 ENCOUNTER — Inpatient Hospital Stay: Admit: 2021-08-23 | Payer: MEDICARE | Primary: Internal Medicine

## 2021-08-23 DIAGNOSIS — E785 Hyperlipidemia, unspecified: Secondary | ICD-10-CM | POA: Diagnosis not present

## 2021-08-23 DIAGNOSIS — Z0000 Encounter for general adult medical examination without abnormal findings: Secondary | ICD-10-CM | POA: Diagnosis not present

## 2021-08-23 DIAGNOSIS — Z9181 History of falling: Secondary | ICD-10-CM | POA: Diagnosis not present

## 2021-08-23 DIAGNOSIS — Z800 Family history of malignant neoplasm of digestive organs: Secondary | ICD-10-CM

## 2021-08-23 MED ORDER — GADOBUTROL 1 MMOL/ML IV SOLN
1 MMOL/ML | Freq: Once | INTRAVENOUS | Status: AC | PRN
Start: 2021-08-23 — End: 2021-08-23
  Administered 2021-08-23: 14:00:00 7.5 mL via INTRAVENOUS

## 2021-08-24 ENCOUNTER — Ambulatory Visit: Payer: MEDICARE | Primary: Internal Medicine

## 2021-08-24 ENCOUNTER — Ambulatory Visit
Admit: 2021-08-24 | Discharge: 2021-08-24 | Payer: PRIVATE HEALTH INSURANCE | Attending: Internal Medicine | Primary: Internal Medicine

## 2021-08-24 DIAGNOSIS — I491 Atrial premature depolarization: Secondary | ICD-10-CM

## 2021-08-24 MED ORDER — AMLODIPINE BESYLATE 10 MG PO TABS
10 MG | ORAL_TABLET | Freq: Every day | ORAL | 3 refills | Status: AC
Start: 2021-08-24 — End: 2021-11-22

## 2021-08-24 NOTE — Patient Instructions (Signed)
Continue Mobic 15 mg daily.  Add Tylenol 1000 mg twice daily for arthritic pains and muscle pains.  Continue physical therapy.  On days that you are not doing physical therapy would go for hour-long walks to help with fatigue muscle aches and pain.  Continue a heart healthy diet.

## 2021-08-24 NOTE — Progress Notes (Signed)
Chief Complaint  Chief Complaint   Patient presents with    Follow-up       HISTORY OF PRESENT ILLNESS  66 y.o. male presents today for follow-up on:   Patient presents back for his 46-month follow-up.  He was recently confirmed a diagnosis of hepatitis C with active infection.  He was referred to infectious disease and is now completed treatment.  He states that he is glad that he is done with the treatment.  Most recent hepatitis C viral panel was negative.  He is currently being followed by Herbie Wales of the infectious disease clinic.  Complains of very stiff legs and back when he sits down or is laying down for any period of time.  Has difficulty standing up and initially mobilizing but once he starts walking and moving symptoms improved.  He has been utilizing the Mobic which helps.  He has seen orthopedics in the past regarding his shoulders.  He has had injections in the past which helped for a short period of time.  He is noted to have chronic osteoarthritis.  We discussed stronger pain medicines which I did not recommend at this time.  Denies any chest pain shortness of breath nausea vomiting.    Reviewed blood work he has a normal hemoglobin but a chronically low MCV likely has a thalassemia.  Has been followed recently by Dr. Lavona Mound regarding his history of hepatitis C.  He recently underwent an ultrasound of his abdomen which demonstrated gallstone versus gallbladder polyp as well as early signs of cirrhosis.  MRI of the abdomen with and without contrast demonstrated no significant liver hepatology but did have noted gallbladder polyp versus sludge.    Assessment & Plan    ASSESSMENT AND PLAN    1. Premature atrial contractions  Assessment & Plan:  History of premature atrial contractions.  Stable at this time.  Still noted on exam.  Patient denies any symptoms of palpitations tachycardia shortness of breath or chest pain   2. Essential hypertension  Assessment & Plan:  Blood pressure has  improved.  Continuing amlodipine 10 mg daily.  Will monitor for side effects.   Orders:  -     amLODIPine (NORVASC) 10 MG tablet; Take 1 tablet by mouth daily, Disp-90 tablet, R-3Normal  3. Shuffling gait  Assessment & Plan:  Noted shuffling gait secondary to osteoarthritis and previous stroke.  No other signs of Parkinson's disease.  We will continue to monitor.   4. Dyslipidemia with elevated low density lipoprotein (LDL) cholesterol and abnormally low high density lipoprotein (HDL) cholesterol  Assessment & Plan:  Recheck lipids prior to next visit, [place].  Continuing Lipitor.   Orders:  -     Lipid Panel; Future  -     CK; Future  -     Comprehensive Metabolic Panel; Future  5. Chronic left shoulder pain  Assessment & Plan:  Has seen orthopedics in the past and received an injection.  Symptoms are currently stable.  Continuing Mobic.  Will monitor for tolerance of medications and improvement in symptoms.     Orders:  -     meloxicam (MOBIC) 15 MG tablet; Take 1 tablet by mouth daily, Disp-90 tablet, R-3Normal  6. 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  A1c within normal limits.  Continuing lifestyle modifications.   7. Solitary pulmonary nodule  Assessment & Plan:  Repeat CT scan in July -50-pack-year  smoking.  Quit in 2021.  Needs a follow-up routine CT lung screening.   8. Chronic hepatitis C without hepatic coma (HCC)  Assessment & Plan:  Status post treatment by infectious disease.  Most recent viral load negative.      Screening abdominal aortic screening for AAA July 2021: Normal no evidence of abdominal aortic aneurysm.    Screening CT scan July 2021: 1 nodule less than 4 mm identified but location not noted.    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    Echocardiogram May 2021: Shows normal echo with normal EF    No flowsheet data found.  Interpretation of Total Score Depression Severity: 1-4 = Minimal depression, 5-9 =  Mild depression, 10-14 = Moderate depression, 15-19 = Moderately severe depression, 20-27 = Severe depression   The diagnoses and plan were discussed with the patient, who verbalizes understanding and agrees with plan.  All questions answered.      Patient Instructions   Continue Mobic 15 mg daily.  Add Tylenol 1000 mg twice daily for arthritic pains and muscle pains.  Continue physical therapy.  On days that you are not doing physical therapy would go for hour-long walks to help with fatigue muscle aches and pain.  Continue a heart healthy diet.    Active medications at end of Visit  Current Outpatient Medications   Medication Sig Dispense Refill    meloxicam (MOBIC) 15 MG tablet Take 1 tablet by mouth daily 90 tablet 3    ketoconazole (NIZORAL) 2 % shampoo MIX 15 TO 30 ML IN WARM WATER AND SOAK AFFECTED NAILS OF FOOT FOR 10 MINUTES DAILY      amLODIPine (NORVASC) 10 MG tablet Take 1 tablet by mouth daily 90 tablet 3    atorvastatin (LIPITOR) 40 MG tablet Take 1 tablet by mouth daily 90 tablet 3    aspirin 325 MG EC tablet 1 tablet Orally Once a day for 30 day(s)       No current facility-administered medications for this visit.          No follow-ups on file.       REVIEW OF SYSTEMS  Review of Systems     PHYSICAL EXAMINATION  Vitals:    08/24/21 1233   BP: 119/76   Pulse: 80   Temp: 98 ??F (36.7 ??C)   Weight: 160 lb 6.4 oz (72.8 kg)   Height: 5\' 10"  (1.778 m)     Body mass index is 23.02 kg/m??.    Physical Exam  Constitutional:       Appearance: Normal appearance.   HENT:      Head: Normocephalic and atraumatic.      Right Ear: External ear normal.      Left Ear: External ear normal.      Mouth/Throat:      Mouth: Mucous membranes are moist.   Eyes:      Extraocular Movements: Extraocular movements intact.      Pupils: Pupils are equal, round, and reactive to light.   Cardiovascular:      Rate and Rhythm: Normal rate and regular rhythm.      Pulses: Normal pulses.      Heart sounds: Normal heart sounds.    Pulmonary:      Effort: Pulmonary effort is normal.      Breath sounds: Normal breath sounds.   Abdominal:      General: Bowel sounds are normal.      Palpations: Abdomen is soft.   Musculoskeletal:  Cervical back: Normal range of motion.   Skin:     General: Skin is warm and dry.      Capillary Refill: Capillary refill takes less than 2 seconds.   Neurological:      General: No focal deficit present.      Mental Status: He is alert and oriented to person, place, and time.   Psychiatric:         Mood and Affect: Mood normal.         Behavior: Behavior normal.         Thought Content: Thought content normal.       [facility]  Current Outpatient Medications on File Prior to Visit   Medication Sig Dispense Refill    ketoconazole (NIZORAL) 2 % shampoo MIX 15 TO 30 ML IN WARM WATER AND SOAK AFFECTED NAILS OF FOOT FOR 10 MINUTES DAILY      atorvastatin (LIPITOR) 40 MG tablet Take 1 tablet by mouth daily 90 tablet 3    aspirin 325 MG EC tablet 1 tablet Orally Once a day for 30 day(s)       No current facility-administered medications on file prior to visit.       ALLERGIES / INTOLERANCES  No Known Allergies    PERTINENT LABS AND IMAGING  Lab Results   Component Value Date/Time    WBC 7.2 04/20/2021 12:46 PM    HGB 13.0 04/20/2021 12:46 PM    PLT 303 04/20/2021 12:46 PM    MCV 67.4 (L) 04/20/2021 12:46 PM

## 2021-08-27 MED ORDER — MELOXICAM 15 MG PO TABS
15 MG | ORAL_TABLET | Freq: Every day | ORAL | 3 refills | Status: AC
Start: 2021-08-27 — End: 2021-11-25

## 2021-08-27 NOTE — Assessment & Plan Note (Signed)
Status post treatment by infectious disease.  Most recent viral load negative.

## 2021-08-27 NOTE — Assessment & Plan Note (Signed)
Has seen orthopedics in the past and received an injection.  Symptoms are currently stable.  Continuing Mobic.  Will monitor for tolerance of medications and improvement in symptoms.

## 2021-08-27 NOTE — Assessment & Plan Note (Signed)
Noted shuffling gait secondary to osteoarthritis and previous stroke.  No other signs of Parkinson's disease.  We will continue to monitor.

## 2021-08-27 NOTE — Assessment & Plan Note (Signed)
Repeat CT scan in July -50-pack-year smoking.  Quit in 2021.  Needs a follow-up routine CT lung screening.

## 2021-08-27 NOTE — Assessment & Plan Note (Addendum)
Blood pressure has improved.  Continuing amlodipine 10 mg daily.  Will monitor for side effects.

## 2021-08-27 NOTE — Assessment & Plan Note (Signed)
Remains on aspirin and statin.  A1c within normal limits.  Continuing lifestyle modifications.

## 2021-08-27 NOTE — Assessment & Plan Note (Signed)
Recheck lipids prior to next visit, [place].  Continuing Lipitor.

## 2021-08-27 NOTE — Assessment & Plan Note (Signed)
History of premature atrial contractions.  Stable at this time.  Still noted on exam.  Patient denies any symptoms of palpitations tachycardia shortness of breath or chest pain

## 2021-08-28 DIAGNOSIS — R030 Elevated blood-pressure reading, without diagnosis of hypertension: Secondary | ICD-10-CM | POA: Diagnosis not present

## 2021-08-28 DIAGNOSIS — Z720 Tobacco use: Secondary | ICD-10-CM | POA: Diagnosis not present

## 2021-08-28 DIAGNOSIS — M961 Postlaminectomy syndrome, not elsewhere classified: Secondary | ICD-10-CM | POA: Diagnosis not present

## 2021-08-28 DIAGNOSIS — Z79891 Long term (current) use of opiate analgesic: Secondary | ICD-10-CM | POA: Diagnosis not present

## 2021-08-28 DIAGNOSIS — Z760 Encounter for issue of repeat prescription: Secondary | ICD-10-CM | POA: Diagnosis not present

## 2021-08-28 DIAGNOSIS — G8929 Other chronic pain: Secondary | ICD-10-CM | POA: Diagnosis not present

## 2021-08-28 DIAGNOSIS — M4727 Other spondylosis with radiculopathy, lumbosacral region: Secondary | ICD-10-CM | POA: Diagnosis not present

## 2021-08-30 DIAGNOSIS — J439 Emphysema, unspecified: Secondary | ICD-10-CM | POA: Diagnosis not present

## 2021-08-30 DIAGNOSIS — I10 Essential (primary) hypertension: Secondary | ICD-10-CM | POA: Diagnosis not present

## 2021-08-30 DIAGNOSIS — E1169 Type 2 diabetes mellitus with other specified complication: Secondary | ICD-10-CM | POA: Diagnosis not present

## 2021-08-30 DIAGNOSIS — E785 Hyperlipidemia, unspecified: Secondary | ICD-10-CM | POA: Diagnosis not present

## 2021-08-30 DIAGNOSIS — Z79899 Other long term (current) drug therapy: Secondary | ICD-10-CM | POA: Diagnosis not present

## 2021-10-25 DIAGNOSIS — I8391 Asymptomatic varicose veins of right lower extremity: Secondary | ICD-10-CM | POA: Diagnosis not present

## 2021-11-13 ENCOUNTER — Inpatient Hospital Stay: Admit: 2021-11-13 | Discharge: 2021-11-13 | Payer: MEDICARE | Primary: Internal Medicine

## 2021-11-13 DIAGNOSIS — B182 Chronic viral hepatitis C: Secondary | ICD-10-CM

## 2021-11-13 LAB — COMPREHENSIVE METABOLIC PANEL
ALT: 30 U/L (ref 0–50)
AST: 24 U/L (ref 0–50)
Albumin/Globulin Ratio: 1.64 (ref 1.00–2.70)
Albumin: 4.3 g/dL (ref 3.5–5.2)
Alk Phosphatase: 88 U/L (ref 40–130)
Anion Gap: 8 mmol/L (ref 2–17)
BUN: 16 mg/dL (ref 8–23)
CO2: 28 mmol/L (ref 22–29)
Calcium: 9.4 mg/dL (ref 8.8–10.2)
Chloride: 106 mmol/L (ref 98–107)
Creatinine: 0.9 mg/dL (ref 0.7–1.3)
Est, Glom Filt Rate: 94 mL/min/1.73m (ref >=60)
Globulin: 2.6 g/dL (ref 1.9–4.4)
Glucose: 98 mg/dL (ref 70–99)
OSMOLALITY CALCULATED: 284 mOsm/kg (ref 270–287)
Potassium: 4.5 mmol/L (ref 3.5–5.3)
Sodium: 142 mmol/L (ref 135–145)
Total Bilirubin: 0.99 mg/dL (ref 0.00–1.20)
Total Protein: 6.9 g/dL (ref 6.4–8.3)

## 2021-11-15 LAB — HEPATITIS C VIRUS (HCV) RNA, DIAGNOSIS: HCV RNA, Qnt: NOT DETECTED IU/mL

## 2021-11-19 NOTE — Telephone Encounter (Signed)
Patient would like to reschedule his appointment on 11/28/21. He needs to reschedule on a Monday or Tuesday . Please call and advise.    Sent to B.Carylon Perches

## 2021-11-20 DIAGNOSIS — M4727 Other spondylosis with radiculopathy, lumbosacral region: Secondary | ICD-10-CM | POA: Diagnosis not present

## 2021-11-20 DIAGNOSIS — Z720 Tobacco use: Secondary | ICD-10-CM | POA: Diagnosis not present

## 2021-11-20 DIAGNOSIS — M961 Postlaminectomy syndrome, not elsewhere classified: Secondary | ICD-10-CM | POA: Diagnosis not present

## 2021-11-20 DIAGNOSIS — M542 Cervicalgia: Secondary | ICD-10-CM | POA: Diagnosis not present

## 2021-11-28 ENCOUNTER — Encounter: Attending: Internal Medicine | Primary: Internal Medicine

## 2021-12-03 ENCOUNTER — Ambulatory Visit: Admit: 2021-12-03 | Discharge: 2021-12-03 | Payer: MEDICARE | Attending: Internal Medicine | Primary: Internal Medicine

## 2021-12-03 DIAGNOSIS — E782 Mixed hyperlipidemia: Secondary | ICD-10-CM

## 2021-12-03 MED ORDER — MELOXICAM 15 MG PO TABS
15 MG | ORAL_TABLET | Freq: Every day | ORAL | 0 refills | Status: AC
Start: 2021-12-03 — End: 2022-04-16

## 2021-12-03 NOTE — Progress Notes (Unsigned)
Chief Complaint  Chief Complaint   Patient presents with    Follow-up       HISTORY OF PRESENT ILLNESS  66 y.o. male presents today for follow-up on:   Done with therapy.   Dysarthria  Prlonged perioss of inactivity - its harder.  Bones and joints hurt more .  Mobic helping    .  HPI   Assessment & Plan    ASSESSMENT AND PLAN    1. Chronic left shoulder pain  The following orders have not been finalized:  -     meloxicam (MOBIC) 15 MG tablet         No flowsheet data found.  Interpretation of Total Score Depression Severity: 1-4 = Minimal depression, 5-9 = Mild depression, 10-14 = Moderate depression, 15-19 = Moderately severe depression, 20-27 = Severe depression   The diagnoses and plan were discussed with the patient, who verbalizes understanding and agrees with plan.  All questions answered.    {time statement optional (Optional):41590}  There are no Patient Instructions on file for this visit.    Active medications at end of Visit  Current Outpatient Medications   Medication Sig Dispense Refill    sildenafil (VIAGRA) 50 MG tablet Take 1 tablet by mouth as needed      flucytosine (ANCOBON) 500 MG capsule MIX THE CONTENTS OF TWO CAPSULES WITH UREA CREAM AND Apply to the affected area once daily      ketoconazole (NIZORAL) 2 % shampoo MIX 15 TO 30 ML IN WARM WATER AND SOAK AFFECTED NAILS OF FOOT FOR 10 MINUTES DAILY      atorvastatin (LIPITOR) 40 MG tablet Take 1 tablet by mouth daily 90 tablet 3    aspirin 325 MG EC tablet 1 tablet Orally Once a day for 30 day(s)      meloxicam (MOBIC) 15 MG tablet Take 1 tablet by mouth daily 90 tablet 3    amLODIPine (NORVASC) 10 MG tablet Take 1 tablet by mouth daily 90 tablet 3     No current facility-administered medications for this visit.          No follow-ups on file.       REVIEW OF SYSTEMS  Review of Systems     PHYSICAL EXAMINATION  Vitals:    12/03/21 1536   BP: 130/86   Pulse: 63   Weight: 159 lb (72.1 kg)   Height: 5\' 10"  (1.778 m)     Body mass index is 22.81  kg/m.    Physical Exam  Constitutional:       Appearance: Normal appearance.   HENT:      Right Ear: External ear normal.      Left Ear: External ear normal.   Musculoskeletal:      Right lower leg: Edema present.      Left lower leg: No edema.   Neurological:      General: No focal deficit present.      Mental Status: He is alert and oriented to person, place, and time.      Comments: Dysarthria     Psychiatric:         Mood and Affect: Mood normal.         Behavior: Behavior normal.       MEDICATIONS at [facility] of Visit  Current Outpatient Medications on File Prior to Visit   Medication Sig Dispense Refill    sildenafil (VIAGRA) 50 MG tablet Take 1 tablet by mouth as needed  flucytosine (ANCOBON) 500 MG capsule MIX THE CONTENTS OF TWO CAPSULES WITH UREA CREAM AND Apply to the affected area once daily      ketoconazole (NIZORAL) 2 % shampoo MIX 15 TO 30 ML IN WARM WATER AND SOAK AFFECTED NAILS OF FOOT FOR 10 MINUTES DAILY      atorvastatin (LIPITOR) 40 MG tablet Take 1 tablet by mouth daily 90 tablet 3    aspirin 325 MG EC tablet 1 tablet Orally Once a day for 30 day(s)      meloxicam (MOBIC) 15 MG tablet Take 1 tablet by mouth daily 90 tablet 3    amLODIPine (NORVASC) 10 MG tablet Take 1 tablet by mouth daily 90 tablet 3     No current facility-administered medications on file prior to visit.       ALLERGIES / INTOLERANCES  No Known Allergies    PERTINENT LABS AND IMAGING  Lab Results   Component Value Date/Time    WBC 7.2 04/20/2021 12:46 PM    HGB 13.0 04/20/2021 12:46 PM    PLT 303 04/20/2021 12:46 PM    MCV 67.4 (L) 04/20/2021 12:46 PM

## 2021-12-20 DIAGNOSIS — J439 Emphysema, unspecified: Secondary | ICD-10-CM | POA: Diagnosis not present

## 2021-12-20 DIAGNOSIS — I10 Essential (primary) hypertension: Secondary | ICD-10-CM | POA: Diagnosis not present

## 2021-12-20 DIAGNOSIS — E785 Hyperlipidemia, unspecified: Secondary | ICD-10-CM | POA: Diagnosis not present

## 2021-12-20 DIAGNOSIS — E1169 Type 2 diabetes mellitus with other specified complication: Secondary | ICD-10-CM | POA: Diagnosis not present

## 2021-12-20 DIAGNOSIS — Z79899 Other long term (current) drug therapy: Secondary | ICD-10-CM | POA: Diagnosis not present

## 2022-01-01 NOTE — Assessment & Plan Note (Signed)
Chronic stable microcytosis with normal hemoglobin.  Likely has thalassemia.

## 2022-01-01 NOTE — Assessment & Plan Note (Signed)
History of premature atrial contractions.  Stable at this time.  Still noted on exam.  Patient denies any symptoms of palpitations tachycardia shortness of breath or chest pain

## 2022-01-01 NOTE — Patient Instructions (Signed)
Learning About Lung Cancer Screening  What is screening for lung cancer?     Lung cancer screening is a way to find some lung cancers early, before a person has any symptoms of the cancer.  Lung cancer screening may help those who have the highest risk for lung cancer--people age 68 and older who are or were heavy smokers. For most people, who aren't at increased risk, screening for lung cancer probably isn't helpful.  Screening won't prevent cancer. And it may not find all lung cancers. Lung cancer screening may lower the risk of dying from lung cancer in a small number of people.  How is it done?  Lung cancer screening is done with a low-dose CT (computed tomography) scan. A CT scan uses X-rays, or radiation, to make detailed pictures of your body. Experts recommend that screening be done in medical centers that focus on finding and treating lung cancer.  Who is screening recommended for?  Lung cancer screening is recommended for people age 68 and older who are or were heavy smokers. That means people with a smoking history of at least 20 pack years. A pack year is a way to measure how heavy a smoker you are or were.  To figure out your pack years, multiply how many packs a day on average (assuming 20 cigarettes per pack) you have smoked by how many years you have smoked. For example:  If you smoked 1 pack a day for 20 years, that's 1 times 20. So you have a smoking history of 20 pack years.  If you smoked 2 packs a day for 10 years, that's 2 times 10. So you have a smoking history of 20 pack years.  Experts agree that screening is for people who have a high risk of lung cancer. But experts don't agree on what high risk means. Some say people age 68 or older with at least a 20-pack-year smoking history are high risk. Others say it's people age 55 or older with a 30-pack-year history.  To see if you could benefit from screening, first find out if you are at high risk for lung cancer. Your doctor can help you  decide your lung cancer risk.  What are the risks of screening?  CT screening for lung cancer isn't perfect. It can show an abnormal result when it turns out there wasn't any cancer. This is called a false-positive result. This means you may need more tests to make sure you don't have cancer. These tests can be harmful and cause a lot of worry.  These tests may include more CT scans and invasive testing like a lung biopsy. In a biopsy, the doctor takes a sample of tissue from inside your lung so it can be looked at under a microscope. A biopsy is the only way to tell if you have lung cancer. If the biopsy finds cancer, you and your doctor will have to decide how or whether to treat it.  Some lung cancers found on CT scans are harmless and would not have caused a problem if they had not been found through screening. But because doctors can't tell which ones will turn out to be harmless, most will be treated. This means that you may get treatment--including surgery, radiation, or chemotherapy--that you don't need.  There is a risk of damage to cells or tissue from being exposed to radiation, including the small amounts used in CTs, X-rays, and other medical tests. Over time, exposure to radiation may cause cancer   and other health problems. But in most cases, the risk of getting cancer from being exposed to small amounts of radiation is low. It's not a reason to avoid these tests for most people.  What are the benefits of screening?  Your scan may be normal (negative).  For some people who are at higher risk, screening lowers the chance of dying of lung cancer. How much and how long you smoked helps to determine your risk level. Screening can find some cancers early, when treatment may be more likely to work.  What happens after screening?  The results of your CT scan will be sent to your doctor. Someone from your care team will explain the results of your scan and answer any questions you may have. If you need any  follow-up, he or she will help you understand what to do next.  After a lung cancer screening, you can go back to your usual activities right away.  A lung cancer screening test can't tell if you have lung cancer. If your results are positive, your doctor can't tell whether an abnormal finding is a harmless nodule, cancer, or something else without doing more tests.  What can you do to help prevent lung cancer?  Some lung cancers can't be prevented. But if you smoke, quitting smoking is the best step you can take to prevent lung cancer. If you want to quit, your doctor can recommend medicines or other ways to help.  Follow-up care is a key part of your treatment and safety. Be sure to make and go to all appointments, and call your doctor if you are having problems. It's also a good idea to know your test results and keep a list of the medicines you take.  Where can you learn more?  Go to https://www.healthwise.net/patientEd and enter Q940 to learn more about "Learning About Lung Cancer Screening."  Current as of: March 1, 2023Content Version: 13.7   2006-2023 [employer].   Care instructions adapted under license by  Health. If you have questions about a medical condition or this instruction, always ask your healthcare professional. [employer] disclaims any warranty or liability for your use of this information.

## 2022-01-01 NOTE — Assessment & Plan Note (Signed)
Remains on aspirin and statin.  A1c within normal limits.  Continuing lifestyle modifications.

## 2022-01-01 NOTE — Assessment & Plan Note (Signed)
Blood pressure has improved.  Continuing amlodipine 10 mg daily.  Will monitor for side effects.

## 2022-01-01 NOTE — Assessment & Plan Note (Signed)
Lung cancer screening CT was completed in July 2021.  Less than a 4 mm nodule was noted but it does not clarify where it was identified.  Repeat scan in July 2023.

## 2022-01-01 NOTE — Assessment & Plan Note (Signed)
Noted shuffling gait secondary to osteoarthritis and previous stroke.  No other signs of Parkinson's disease.  We will continue to monitor.

## 2022-01-01 NOTE — Assessment & Plan Note (Signed)
Continuing Lipitor.  Lipids stable.   Latest Reference Range & Units 11/22/20 14:39   Chol/HDL Ratio 0.0 - 4.4  2.6   Cholesterol, Total 100 - 200 mg/dL 322   HDL Cholesterol >=02 mg/dL 66   LDL Cholesterol 0.0 - 100.0 mg/dL 54.2   LDL/HDL Ratio  1.3   Triglycerides 0 - 149 mg/dL 96   VLDL 5.0 - 70.6 mg/dL 23.7   Repeat labs prior to next appointment

## 2022-01-02 NOTE — Progress Notes (Signed)
Yes we did.

## 2022-01-03 NOTE — Progress Notes (Signed)
Yes, [place] did.

## 2022-01-28 NOTE — Telephone Encounter (Signed)
err

## 2022-01-29 NOTE — Telephone Encounter (Signed)
Patient states that he faxed some paper work over on yesterday.   He states that the forms are from [employer] [facility] adult [facility] facility.   He states that the form needs to be completed and faxed.  Information to fax form back is located on the form.   Please call and advise.    Sent to B.Carylon Perches

## 2022-01-30 NOTE — Telephone Encounter (Signed)
We have the papers, patient notified they will be filled out when he comes in for his appointment next week.

## 2022-02-07 ENCOUNTER — Encounter

## 2022-02-07 ENCOUNTER — Ambulatory Visit: Admit: 2022-02-07 | Discharge: 2022-02-07 | Payer: MEDICARE | Attending: Internal Medicine | Primary: Internal Medicine

## 2022-02-07 DIAGNOSIS — I6381 Other cerebral infarction due to occlusion or stenosis of small artery: Secondary | ICD-10-CM

## 2022-02-07 LAB — COMPREHENSIVE METABOLIC PANEL
ALT: 31 U/L (ref 0–50)
AST: 29 U/L (ref 0–50)
Albumin/Globulin Ratio: 1.4 (ref 1.00–2.70)
Albumin: 4.3 g/dL (ref 3.5–5.2)
Alk Phosphatase: 96 U/L (ref 40–130)
Anion Gap: 15 mmol/L (ref 2–17)
BUN: 13 mg/dL (ref 8–23)
CO2: 24 mmol/L (ref 22–29)
Calcium: 9.6 mg/dL (ref 8.8–10.2)
Chloride: 103 mmol/L (ref 98–107)
Creatinine: 1 mg/dL (ref 0.7–1.3)
Est, Glom Filt Rate: 82 mL/min/1.73m (ref >=60)
Globulin: 3.1 g/dL (ref 1.9–4.4)
Glucose: 82 mg/dL (ref 70–99)
OSMOLALITY CALCULATED: 282 mOsm/kg (ref 270–287)
Potassium: 4.6 mmol/L (ref 3.5–5.3)
Sodium: 142 mmol/L (ref 135–145)
Total Bilirubin: 1.14 mg/dL (ref 0.00–1.20)
Total Protein: 7.4 g/dL (ref 6.4–8.3)

## 2022-02-07 LAB — URINALYSIS WITH REFLEX TO CULTURE
Amorphous, UA: NONE SEEN /HPF
Bilirubin Urine: NEGATIVE
Blood, Urine: NEGATIVE
Glucose, UA: NEGATIVE
Ketones, Urine: NEGATIVE
Leukocyte Esterase, Urine: NEGATIVE
Nitrite, Urine: NEGATIVE
Protein, UA: NEGATIVE
Specific Gravity, UA: 1.02 (ref 1.003–1.035)
Urobilinogen, Urine: 0.2 EU/dL
pH, UA: 6 (ref 4.5–8.0)

## 2022-02-07 LAB — PSA SCREENING: Screening PSA: 2.09 ng/mL (ref 0.000–4.000)

## 2022-02-07 LAB — LIPID PANEL
Chol/HDL Ratio: 2.7 (ref 0.0–4.4)
Cholesterol: 159 mg/dL (ref 100–200)
HDL: 60 mg/dL (ref >=40)
LDL Cholesterol: 81.8 mg/dL (ref 0.0–100.0)
LDL/HDL Ratio: 1.4
Triglycerides: 86 mg/dL (ref 0–149)
VLDL: 17.2 mg/dL (ref 5.0–40.0)

## 2022-02-07 LAB — CK: Total CK: 296 U/L — ABNORMAL HIGH (ref 20–200)

## 2022-02-07 MED ORDER — TAMSULOSIN HCL 0.4 MG PO CAPS
0.4 MG | ORAL_CAPSULE | Freq: Every day | ORAL | 1 refills | Status: AC
Start: 2022-02-07 — End: ?

## 2022-02-07 NOTE — Progress Notes (Signed)
Medicare Annual Wellness Visit    Chief Complaint  Chief Complaint   Patient presents with    Medicare AWV       HISTORY OF PRESENT ILLNESS  66 y.o. male presents today for follow-up on: History of a CVA, hypertension, chronic hepatitis C status posttreatment, dyslipidemia.  Patient states he has been sad over the past month.  He unfortunately lost his significant other back in [place] where he moved from on August 3.  It was all of a sudden.  Patient states he gets stiff when he sits for any prolonged period of time or is not moving around.  But he tries to continue to walk on a regular basis.  Requires a cane or walker.  Recently has developed some more lower back pain.  Occasionally radiates down the right side.  He notes that he has not been doing his back exercises or stretches recently.  He is looking at going to an adult center while his children are at work.  Denies any chest pain or shortness of breath nausea or vomiting.  Reviewed AWV questionnaire with the patient.  Patient complains of urinary frequency  Complains of lower back pain.    Informs me that his oldest brother has history of prostate cancer and possibly another brother as well.  He is up-to-date on his colonoscopy -Dr. Domingo Cocking October 2022 with polyp removal    Family hx - prostate cancer - oldest brother   Advance Care Planning   HCPOA:Son - Michael Iles Junior  Living Will:No  Code Status:Full Code        Assessment & Plan    ASSESSMENT AND PLAN    1. Cerebrovascular accident (CVA) of right thalamus [facility])  Assessment & Plan:   Peak hemoglobinHistory of hemorrhagic stroke.  Likely secondary to uncontrolled hypertension.  Recovering.  On statin, blood pressure medicine.  Orders:  -     EKG 12 Lead  -     CBC with Auto Differential; Future  -     CK; Future  -     Comprehensive Metabolic Panel; Future  -     Lipid Panel; Future  2. Encounter for subsequent annual wellness visit (AWV) in Medicare patient  Assessment & Plan:  Received  previous pneumonia 23.  Will need pneumonia 20.    Informs me that his oldest brother has history of prostate cancer and possibly another brother as well.  He is up-to-date on his colonoscopy -Dr. Domingo Cocking October 2022 with polyp removal     3. Urinary frequency  Assessment & Plan:  Evaluate for urinary tract infection.  Start Flomax.  Monitor for orthostasis, hypotension.   Orders:  -     tamsulosin (FLOMAX) 0.4 MG capsule; Take 1 capsule by mouth daily, Disp-90 capsule, R-1Normal  -     Urinalysis With Reflex To Culture; Future  4. Screening PSA (prostate specific antigen)  -     PSA Screening; Future  5. Essential hypertension  Assessment & Plan:  Blood pressure is improved.  Continuing amlodipine 10 mg daily.  Still borderline high.  Orders:  -     CK; Future  -     Comprehensive Metabolic Panel; Future  -     Lipid Panel; Future  6. Chronic hepatitis C without hepatic coma (HCC)  Assessment & Plan:  Status post treatment by infectious disease.  Most recent viral load negative.   Orders:  -     CBC with Auto Differential; Future  -  Lipid Panel; Future  7. 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  A1c within normal limits.  Continuing lifestyle modifications.   8. Solitary pulmonary nodule  Assessment & Plan:   Initial lung cancer screening was completed July 2021.  Less than a 4 mm lung nodule was noted but it does not clarify where it was.  Copy of the report noted under overview.  Repeat scan.  9. Dyslipidemia with elevated low density lipoprotein (LDL) cholesterol and abnormally low high density lipoprotein (HDL) cholesterol  Assessment & Plan:  Lipids stable.  Continue Lipitor 40     Latest Reference Range & Units 02/07/22 12:31   Total CK 20 - 200 unit/L 296 (H)   Chol/HDL Ratio 0.0 - 4.4  2.7   Cholesterol, Total 100 - 200 mg/dL 096   HDL Cholesterol >=04 mg/dL 60   LDL Cholesterol 0.0 - 100.0 mg/dL 54.0   LDL/HDL Ratio  1.4    Triglycerides 0 - 149 mg/dL 86   VLDL 5.0 - 98.1 mg/dL 19.1   (H): Data is abnormally high            02/07/2022    10:30 AM   PHQ Scores   PHQ2 Score 1   PHQ9 Score 1     Interpretation of Total Score Depression Severity: 1-4 = Minimal depression, 5-9 = Mild depression, 10-14 = Moderate depression, 15-19 = Moderately severe depression, 20-27 = Severe depression   Interpretation of Total Score Depression Severity: 1-4 = Minimal depression, 5-9 = Mild depression, 10-14 = Moderate depression, 15-19 = Moderately severe depression, 20-27 = Severe depression   The diagnoses and plan were discussed with the patient, who verbalizes understanding and agrees with plan.  All questions answered.          Active medications at end of Visit  Current Outpatient Medications   Medication Sig Dispense Refill    tamsulosin (FLOMAX) 0.4 MG capsule Take 1 capsule by mouth daily 90 capsule 1    sildenafil (VIAGRA) 50 MG tablet Take 1 tablet by mouth as needed      flucytosine (ANCOBON) 500 MG capsule Indications: Feet      meloxicam (MOBIC) 15 MG tablet Take 1 tablet by mouth daily 90 tablet 0    ketoconazole (NIZORAL) 2 % shampoo MIX 15 TO 30 ML IN WARM WATER AND SOAK AFFECTED NAILS OF FOOT FOR 10 MINUTES DAILY      amLODIPine (NORVASC) 10 MG tablet Take 1 tablet by mouth daily 90 tablet 3    atorvastatin (LIPITOR) 40 MG tablet Take 1 tablet by mouth daily 90 tablet 3    aspirin 325 MG EC tablet 1 tablet Orally Once a day for 30 day(s)       No current facility-administered medications for this visit.          Return in 1 year (on 02/08/2023) for 3 month routine. Marland Kitchen       REVIEW OF SYSTEMS  Review of Systems     PHYSICAL EXAMINATION  Vitals:    02/07/22 1017   BP: 138/88   Pulse: 67   SpO2: 99%   Weight: 159 lb (72.1 kg)   Height: 5\' 10"  (1.778 m)     Body mass index is 22.81 kg/m.    Physical Exam  Constitutional:       Appearance: Normal appearance. He is normal weight.   HENT:      Head:  Normocephalic and atraumatic.      Right Ear:  External ear normal.      Left Ear: External ear normal.      Nose: Nose normal.      Mouth/Throat:      Mouth: Mucous membranes are moist.      Pharynx: Oropharynx is clear.   Eyes:      Conjunctiva/sclera: Conjunctivae normal.      Pupils: Pupils are equal, round, and reactive to light.   Cardiovascular:      Rate and Rhythm: Normal rate and regular rhythm.      Pulses: Normal pulses.      Heart sounds: Normal heart sounds.   Pulmonary:      Effort: Pulmonary effort is normal.      Breath sounds: Normal breath sounds.   Abdominal:      General: Bowel sounds are normal.      Palpations: Abdomen is soft.   Musculoskeletal:         General: Normal range of motion.      Cervical back: Normal range of motion and neck supple.   Skin:     General: Skin is warm and dry.      Capillary Refill: Capillary refill takes less than 2 seconds.   Neurological:      General: No focal deficit present.      Mental Status: He is alert and oriented to person, place, and time. Mental status is at baseline.   Psychiatric:         Mood and Affect: Mood normal.         Behavior: Behavior normal.         Thought Content: Thought content normal.         Judgment: Judgment normal.         MEDICATIONS at [facility]tart of Visit  Current Outpatient Medications on File Prior to Visit   Medication Sig Dispense Refill    sildenafil (VIAGRA) 50 MG tablet Take 1 tablet by mouth as needed      flucytosine (ANCOBON) 500 MG capsule Indications: Feet      meloxicam (MOBIC) 15 MG tablet Take 1 tablet by mouth daily 90 tablet 0    ketoconazole (NIZORAL) 2 % shampoo MIX 15 TO 30 ML IN WARM WATER AND SOAK AFFECTED NAILS OF FOOT FOR 10 MINUTES DAILY      amLODIPine (NORVASC) 10 MG tablet Take 1 tablet by mouth daily 90 tablet 3    atorvastatin (LIPITOR) 40 MG tablet Take 1 tablet by mouth daily 90 tablet 3    aspirin 325 MG EC tablet 1 tablet Orally Once a day for 30 day(s)       No current facility-administered medications on file prior to visit.       ALLERGIES /  INTOLERANCES  No Known Allergies    PERTINENT LABS AND IMAGING  Lab Results   Component Value Date/Time    WBC 8.5 02/07/2022 12:31 PM    HGB 12.8 (L) 02/07/2022 12:31 PM    PLT 281 02/07/2022 12:31 PM    MCV 67.9 (L) 02/07/2022 12:31 PM    NEUTROABS 4.4 02/07/2022 12:31 PM         Medicare Annual Wellness Visit    Michael Ilesickey Galka Sr. is here for Medicare AWV    Assessment & Plan   Cerebrovascular accident (CVA) of right thalamus (HCC)  Assessment & Plan:   Peak hemoglobinHistory of hemorrhagic stroke.  Likely secondary to uncontrolled hypertension.  Recovering.  On statin, blood pressure medicine.  Orders:  -     EKG 12 Lead  -     CBC with Auto Differential; Future  -     CK; Future  -     Comprehensive Metabolic Panel; Future  -     Lipid Panel; Future  Encounter for subsequent annual wellness visit (AWV) in Medicare patient  Assessment & Plan:  Received previous pneumonia 23.  Will need pneumonia 20.    Informs me that his oldest brother has history of prostate cancer and possibly another brother as well.  He is up-to-date on his colonoscopy -Dr. Domingo Cocking October 2022 with polyp removal     Urinary frequency  Assessment & Plan:  Evaluate for urinary tract infection.  Start Flomax.  Monitor for orthostasis, hypotension.   Orders:  -     tamsulosin (FLOMAX) 0.4 MG capsule; Take 1 capsule by mouth daily, Disp-90 capsule, R-1Normal  -     Urinalysis With Reflex To Culture; Future  Screening PSA (prostate specific antigen)  -     PSA Screening; Future  Essential hypertension  Assessment & Plan:  Blood pressure is improved.  Continuing amlodipine 10 mg daily.  Still borderline high.  Orders:  -     CK; Future  -     Comprehensive Metabolic Panel; Future  -     Lipid Panel; Future  Chronic hepatitis C without hepatic coma (HCC)  Assessment & Plan:  Status post treatment by infectious disease.  Most recent viral load negative.   Orders:  -     CBC with Auto Differential; Future  -     Lipid Panel; Future  Hemiplga following  cerebral infrc affecting left nondom side (HCC)  Assessment & Plan:  Remains on aspirin and statin.  A1c within normal limits.  Continuing lifestyle modifications.   Solitary pulmonary nodule  Assessment & Plan:   Initial lung cancer screening was completed July 2021.  Less than a 4 mm lung nodule was noted but it does not clarify where it was.  Copy of the report noted under overview.  Repeat scan.  Dyslipidemia with elevated low density lipoprotein (LDL) cholesterol and abnormally low high density lipoprotein (HDL) cholesterol  Assessment & Plan:  Lipids stable.  Continue Lipitor 40     Latest Reference Range & Units 02/07/22 12:31   Total CK 20 - 200 unit/L 296 (H)   Chol/HDL Ratio 0.0 - 4.4  2.7   Cholesterol, Total 100 - 200 mg/dL 829   HDL Cholesterol >=56 mg/dL 60   LDL Cholesterol 0.0 - 100.0 mg/dL 21.3   LDL/HDL Ratio  1.4   Triglycerides 0 - 149 mg/dL 86   VLDL 5.0 - 08.6 mg/dL 57.8   (H): Data is abnormally high     Recommendations for Preventive Services Due: see orders and patient instructions/AVS.  Recommended screening schedule for the next 5-10 years is provided to the patient in written form: see Patient Instructions/AVS.     Return in 1 year (on 02/08/2023) for 3 month routine. .     Subjective   Patient's complete Health Risk Assessment and screening values have been reviewed and are found in Flowsheets. The following problems were reviewed today and where indicated follow up appointments were made and/or referrals ordered.    Positive Risk Factor Screenings with Interventions:               General HRA Questions:  Select all that apply: (!) Loneliness  Loneliness Interventions:  Patient comments: adult day care        Dentist Screen:  Have you seen the dentist within the past year?: (!) No    Intervention:  Advised to schedule with their dentist     Vision Screen:  Do you have difficulty driving, watching TV, or doing any of your daily activities because of your eyesight?: No  Have you had an eye  exam within the past year?: (!) No  No results found.    Interventions:   Patient encouraged to make appointment with their eye specialist  Went to eye doctor within  the past year  uses glasses        Advanced Directives:  Do you have a Living Will?: (!) No    Intervention:  has NO advanced directive - information provided         LDCT Screening: Discussed with patient the benefits and harms of screening, follow-up diagnostic testing, over-diagnosis, false positive rate, and total radiation exposure. Counseled on the importance of adherence to annual lung cancer LDCT screening, impact of comorbidities, ability and willingness to undergo diagnosis and treatment. Counseled on the importance of maintaining cigarette smoking abstinence and cessation. The patient has a history of >20 pack years and is either still smoking or quit within the last 15 years. There are no signs or symptoms of lung cancer.    CV Risk Counseling:  Patient was asked about his current diet and exercise habits, and personalized advice was provided regarding recommended lifestyle changes. Patient's individual cardiovascular disease risk factors, including advanced age (> 15 for men, > 65 for women), dyslipidemia, hypertension, and smoking/tobacco exposure, were discussed, as well as the likely benefits of lifestyle changes. Based upon patient's motivation to change his behavior, the following plan was agreed upon to work toward lowering cardiovascular disease risk: Mediterranean diet, at least 150 minutes of exercise/week, and increase physical activity, as tolerated.  Aspirin use for primary prevention of cardiovascular disease for men 45-79 and women 55-79: Indicated- continue daily aspirin. Educational materials for lifestyle changes were provided. Patient will follow-up in 6 month(s) with PCP. Provider spent 15 minutes counseling patient.              Objective   Vitals:    02/07/22 1017   BP: 138/88   Pulse: 67   SpO2: 99%   Weight: 159 lb  (72.1 kg)   Height:  (1.778 m)      Body mass index is 22.81 kg/m.             No Known Allergies  Prior to Visit Medications    Medication Sig Taking? Authorizing Provider   tamsulosin (FLOMAX) 0.4 MG capsule Take 1 capsule by mouth daily Yes Arvella Merles V, MD   sildenafil (VIAGRA) 50 MG tablet Take 1 tablet by mouth as needed Yes Historical Provider, MD   flucytosine (ANCOBON) 500 MG capsule Indications: Feet Yes Historical Provider, MD   meloxicam (MOBIC) 15 MG tablet Take 1 tablet by mouth daily Yes Arvella Merles V, MD   ketoconazole (NIZORAL) 2 % shampoo MIX 15 TO 30 ML IN WARM WATER AND SOAK AFFECTED NAILS OF FOOT FOR 10 MINUTES DAILY Yes Historical Provider, MD   amLODIPine (NORVASC) 10 MG tablet Take 1 tablet by mouth daily Yes Arvella Merles V, MD   atorvastatin (LIPITOR) 40 MG tablet Take 1 tablet by mouth daily Yes Jones Broom, MD   aspirin 325 MG EC tablet 1 tablet Orally  Once a day for 30 day(s) Yes Historical Provider, MD       CareTeam (Including outside providers/suppliers regularly involved in providing care):   Patient Care Team:  Jones Broom, MD as PCP - General (Internal Medicine)  Jones Broom, MD as PCP - Empaneled Provider  Luan Pulling, MD as Surgeon (Gastroenterology)     Reviewed and updated this visit:  Tobacco  Allergies  Meds  Problems  Med Hx  Surg Hx  Soc Hx  Fam Hx

## 2022-02-07 NOTE — Patient Instructions (Addendum)
Flu Shot yearly high dise,  Covid booster out in middle of October - recommend that too  Jones Broom, MD 11:00 AM.02/07/2022        Learning About Emotional Support  When do you need emotional support?     You might find getting support from others helpful when you have a long-term health problem. Often people feel alone, confused, or scared when coping with an illness. But you aren't alone. Other people are going through the same thing you are and know how you feel.  Talking with others about your feelings can help you feel better.  Your family and friends can give you support. So can your doctor, a support group, or a church. If you have a support network, you will not feel as alone. You will learn new ways to deal with your situation, and you may try harder to overcome it.  Where you can get support  Family and friends: They can help you cope by giving you comfort and encouragement.  Counseling: Professional counseling can help you cope with situations that interfere with your life and cause stress. Counseling can help you understand and deal with your illness.  Your doctor: Find a doctor you trust and feel comfortable with. Be open and honest about your fears and concerns. Your doctor can help you get the right medical treatments, including counseling.  Spiritual or religious groups: They can provide comfort and may be able to help you find counseling or other social support services.  Social groups: They can help you meet new people and get involved in activities you enjoy.  Community support groups: In a support group, you can talk to others who have dealt with the same problems or illness as you. You can encourage one another and learn ways to cope with tough emotions.  How can you find a support group?  Finding a support group that works for you may take time. There are many options. Some groups have a group leader who helps lead discussions or shares information. Others are less formal. Some meet in person,  while others meet online.  Try using these resources to help you find the best support group for you.  Your doctor, health care team, or counselor.  People with the same health concern.  Your local church, mosque, synagogue, or other religious group.  A city, state, or national group that provides support for your health concern. Check your local library or community center for a list of these groups. Or look for information online.  Your local community, friends, and family.  Supportive relationships  A supportive relationship includes emotional support such as love, trust, and understanding, as well as advice and concrete help, such as help managing your time.  Reach out to others  Family and friends can help you. Ask them to:  Listen to you and give you encouragement. This can keep you from feeling hopeless or alone.  Help with small daily tasks or with bigger problems. A helping hand can keep you from feeling overwhelmed.  Help you manage a health problem. For example, ask them to go to doctor visits with you. Your loved ones can offer support by being involved in your medical care.  Respect your relationships  A good relationship is also a two-way street. You count on help from others, but they also count on you.  Know your friends' limits. You don't have to see or call your friends every day. If you are going through a rough  patch, ask friends if you can contact them outside of the usual boundaries.  Don't always complain or talk about yourself. Know when it's time to stop talking and listen or just enjoy your friend's company.  Know that good friends can be a bad influence. For example, if a friend encourages you to drink when you know it will harm you, you may want to end the friendship.  Where can you learn more?  Go to [employer] and enter G092 to learn more about "Learning About Emotional Support."  Current as of: November 25, 2021               Content Version: 13.8   2006-2023  Healthwise, Incorporated.   Care instructions adapted under license by [facility]. If you have questions about a medical condition or this instruction, always ask your healthcare professional. Healthwise, Incorporated disclaims any warranty or liability for your use of this information.           Learning About Dental Care for Older Adults  Dental care for older adults: Overview  Dental care for older people is much the same as for younger adults. But older adults do have concerns that younger adults do not. Older adults may have problems with gum disease and decay on the roots of their teeth. They may need missing teeth replaced or broken fillings fixed. Or they may have dentures that need to be cared for. Some older adults may have trouble holding a toothbrush.  You can help remind the person you are caring for to brush and floss their teeth or to clean their dentures. In some cases, you may need to do the brushing and other dental care tasks. People who have trouble using their hands or who have dementia may need this extra help.  How can you help with dental care?  Normal dental care  To keep the teeth and gums healthy:  Brush the teeth with fluoride toothpaste twice a day--in the morning and at night--and floss at least once a day. Plaque can quickly build up on the teeth of older adults.  Watch for the signs of gum disease. These signs include gums that bleed after brushing or after eating hard foods, such as apples.  See a dentist regularly. Many experts recommend checkups every 6 months.  Keep the dentist up to date on any new medications the person is taking.  Encourage a balanced diet that includes whole grains, vegetables, and fruits, and that is low in saturated fat and sodium.  Encourage the person you're caring for not to use tobacco products. They can affect dental and general health.  Many older adults have a fixed income and feel that they can't afford dental care. But most towns and cities have  programs in which dentists help older adults by lowering fees. Contact your area's public health offices or social services for information about dental care in your area.  Using a toothbrush  Older adults with arthritis sometimes have trouble brushing their teeth because they can't easily hold the toothbrush. Their hands and fingers may be stiff, painful, or weak. If this is the case, you can:  Offer an Mining engineer toothbrush.  Enlarge the handle of a non-electric toothbrush by wrapping a sponge, an elastic bandage, or adhesive tape around it.  Push the toothbrush handle through a ball made of rubber or soft foam.  Make the handle longer and thicker by taping Popsicle sticks or tongue depressors to it.  You may also be able  to buy special toothbrushes, toothpaste dispensers, and floss holders.  Your doctor may recommend a soft-bristle toothbrush if the person you care for bleeds easily. Bleeding can happen because of a health problem or from certain medicines.  A toothpaste for sensitive teeth may help if the person you care for has sensitive teeth.  How do you brush and floss someone's teeth?  If the person you are caring for has a hard time cleaning their teeth on their own, you may need to brush and floss their teeth for them. It may be easiest to have the person sit and face away from you, and to sit or stand behind them. That way you can steady their head against your arm as you reach around to floss and brush their teeth. Choose a place that has good lighting and is comfortable for both of you.  Before you begin, gather your supplies. You will need gloves, floss, a toothbrush, and a container to hold water if you are not near a sink. Wash and dry your hands well and put on gloves. Start by flossing:  Gently work a piece of floss between each of the teeth toward the gums. A plastic flossing tool may make this easier, and they are available at most drugstores.  Curve the floss around each tooth into a U-shape and  gently slide it under the gum line.  Move the floss firmly up and down several times to scrape off the plaque.  After you've finished flossing, throw away the used floss and begin brushing:  Wet the brush and apply toothpaste.  Place the brush at a 45-degree angle where the teeth meet the gums. Press firmly, and move the brush in small circles over the surface of the teeth.  Be careful not to brush too hard. Vigorous brushing can make the gums pull away from the teeth and can scratch the tooth enamel.  Brush all surfaces of the teeth, on the tongue side and on the cheek side. Pay special attention to the front teeth and all surfaces of the back teeth.  Brush chewing surfaces with short back-and-forth strokes.  After you've finished, help the person rinse the remaining toothpaste from their mouth.  Where can you learn more?  Go to [employer] and enter F944 to learn more about "Learning About Dental Care for Older Adults."  Current as of: April 15, 2021               Content Version: 13.8   2006-2023 Healthwise, Incorporated.   Care instructions adapted under license by [facility]. If you have questions about a medical condition or this instruction, always ask your healthcare professional. Healthwise, Incorporated disclaims any warranty or liability for your use of this information.           Learning About Vision Tests  What are vision tests?     The four most common vision tests are visual acuity tests, refraction, visual field tests, and color vision tests.  Visual acuity (sharpness) tests  These tests are used:  To see if you need glasses or contact lenses.  To monitor an eye problem.  To check an eye injury.  Visual acuity tests are done as part of routine exams. You may also have this test when you get your driver's license or apply for some types of jobs.  Visual field tests  These tests are used:  To check for vision loss in any area of your range of vision.  To screen for  certain eye diseases.  To look for nerve damage after a stroke, head injury, or other problem that could reduce blood flow to the brain.  Refraction and color tests  A refraction test is done to find the right prescription for glasses and contact lenses.  A color vision test is done to check for color blindness.  Color vision is often tested as part of a routine exam. You may also have this test when you apply for a job where recognizing different colors is important, such as truck driving, Optician, dispensing, or the [employer].  How are vision tests done?  Visual acuity test   You cover one eye at a time.  You read aloud from a wall chart across the room.  You read aloud from a small card that you hold in your hand.  Refraction   You look into a special device.  The device puts lenses of different strengths in front of each eye to see how strong your glasses or contact lenses need to be.  Visual field tests   Your doctor may have you look through special machines.  Or your doctor may simply have you stare straight ahead while they move a finger into and out of your field of vision.  Color vision test   You look at pieces of printed test patterns in various colors. You say what number or symbol you see.  Your doctor may have you trace the number or symbol using a pointer.  How do these tests feel?  There is very little chance of having a problem from this test. If dilating drops are used for a vision test, they may make the eyes sting and cause a medicine taste in the mouth.  Follow-up care is a key part of your treatment and safety. Be sure to make and go to all appointments, and call your doctor if you are having problems. It's also a good idea to know your test results and keep a list of the medicines you take.  Where can you learn more?  Go to [employer] and enter G551 to learn more about "Learning About Vision Tests."  Current as of: November 06, 2021               Content Version: 13.8   2006-2023  Healthwise, Incorporated.   Care instructions adapted under license by [facility]. If you have questions about a medical condition or this instruction, always ask your healthcare professional. Healthwise, Incorporated disclaims any warranty or liability for your use of this information.           Advance Directives: Care Instructions  Overview  An advance directive is a legal way to state your wishes at the end of your life. It tells your family and your doctor what to do if you can't say what you want.  There are two main types of advance directives. You can change them any time your wishes change.  Living will.  This form tells your family and your doctor your wishes about life support and other treatment. The form is also called a declaration.  Medical power of attorney.  This form lets you name a person to make treatment decisions for you when you can't speak for yourself. This person is called a health care agent (health care proxy, health care surrogate). The form is also called a durable power of attorney for health care.  If you do not have an advance directive, decisions about your medical care may be made by  a family member, or by a doctor or a judge who doesn't know you.  It may help to think of an advance directive as a gift to the people who care for you. If you have one, they won't have to make tough decisions by themselves.  For more information, including forms for your state, see the [employer] website ([employer]cy).  Follow-up care is a key part of your treatment and safety. Be sure to make and go to all appointments, and call your doctor if you are having problems. It's also a good idea to know your test results and keep a list of the medicines you take.  What should you include in an advance directive?  Many states have a unique advance directive form. (It may ask you to address specific issues.) Or you might use a universal form that's approved by many  states.  If your form doesn't tell you what to address, it may be hard to know what to include in your advance directive. Use the questions below to help you get started.  Who do you want to make decisions about your medical care if you are not able to?  What life-support measures do you want if you have a serious illness that gets worse over time or can't be cured?  What are you most afraid of that might happen? (Maybe you're afraid of having pain, losing your independence, or being kept alive by machines.)  Where would you prefer to die? (Your home? A hospital? A nursing home?)  Do you want to donate your organs when you die?  Do you want certain religious practices performed before you die?  When should you call for help?  Be sure to contact your doctor if you have any questions.  Where can you learn more?  Go to [employer] and enter R264 to learn more about "Advance Directives: Care Instructions."  Current as of: August 26, 2021               Content Version: 13.8   2006-2023 Healthwise, Incorporated.   Care instructions adapted under license by [facility]. If you have questions about a medical condition or this instruction, always ask your healthcare professional. Healthwise, Incorporated disclaims any warranty or liability for your use of this information.           A Healthy Heart: Care Instructions  Your Care Instructions     Coronary artery disease, also called heart disease, occurs when a substance called plaque builds up in the vessels that supply oxygen-rich blood to your heart muscle. This can narrow the blood vessels and reduce blood flow. A heart attack happens when blood flow is completely blocked. A high-fat diet, smoking, and other factors increase the risk of heart disease.  Your doctor has found that you have a chance of having heart disease. You can do lots of things to keep your heart healthy. It may not be easy, but you can change your diet, exercise more, and quit  smoking. These steps really work to lower your chance of heart disease.  Follow-up care is a key part of your treatment and safety. Be sure to make and go to all appointments, and call your doctor if you are having problems. It's also a good idea to know your test results and keep a list of the medicines you take.  How can you care for yourself at home?  Diet    Use less salt when you cook and eat. This helps lower your  blood pressure. Taste food before salting. Add only a little salt when you think you need it. With time, your taste buds will adjust to less salt.     Eat fewer snack items, fast foods, canned soups, and other high-salt, high-fat, processed foods.     Read food labels and try to avoid saturated and trans fats. They increase your risk of heart disease by raising cholesterol levels.     Limit the amount of solid fat-butter, margarine, and shortening-you eat. Use olive, peanut, or canola oil when you cook. Bake, broil, and steam foods instead of frying them.     Eat a variety of fruit and vegetables every day. Dark green, deep orange, red, or yellow fruits and vegetables are especially good for you. Examples include spinach, carrots, peaches, and berries.     Foods high in fiber can reduce your cholesterol and provide important vitamins and minerals. High-fiber foods include whole-grain cereals and breads, oatmeal, beans, brown rice, citrus fruits, and apples.     Eat lean proteins. Heart-healthy proteins include seafood, lean meats and poultry, eggs, beans, peas, nuts, seeds, and soy products.     Limit drinks and foods with added sugar. These include candy, desserts, and soda pop.   Lifestyle changes    If your doctor recommends it, get more exercise. Walking is a good choice. Bit by bit, increase the amount you walk every day. Try for at least 30 minutes on most days of the week. You also may want to swim, bike, or do other activities.     Do not smoke. If you need help quitting, talk to your doctor  about stop-smoking programs and medicines. These can increase your chances of quitting for good. Quitting smoking may be the most important step you can take to protect your heart. It is never too late to quit.     Limit alcohol to 2 drinks a day for men and 1 drink a day for women. Too much alcohol can cause health problems.     Manage other health problems such as diabetes, high blood pressure, and high cholesterol. If you think you may have a problem with alcohol or drug use, talk to your doctor.   Medicines    Take your medicines exactly as prescribed. Call your doctor if you think you are having a problem with your medicine.     If your doctor recommends aspirin, take the amount directed each day. Make sure you take aspirin and not another kind of pain reliever, such as acetaminophen (Tylenol).   When should you call for help?   Call 911 if you have symptoms of a heart attack. These may include:    Chest pain or pressure, or a strange feeling in the chest.     Sweating.     Shortness of breath.     Pain, pressure, or a strange feeling in the back, neck, jaw, or upper belly or in one or both shoulders or arms.     Lightheadedness or sudden weakness.     A fast or irregular heartbeat.   After you call 911, the operator may tell you to chew 1 adult-strength or 2 to 4 low-dose aspirin. Wait for an ambulance. Do not try to drive yourself.  Watch closely for changes in your health, and be sure to contact your doctor if you have any problems.  Where can you learn more?  Go to [employer] and enter F075 to learn more about "A Healthy Heart: Care  Instructions."  Current as of: November 25, 2021               Content Version: 13.8   2006-2023 Healthwise, Incorporated.   Care instructions adapted under license by [facility]. If you have questions about a medical condition or this instruction, always ask your healthcare professional. Healthwise, Incorporated disclaims any warranty or liability for  your use of this information.      Personalized Preventive Plan for Michael IlesRickey Gobert Sr. - 02/07/2022  Medicare offers a range of preventive health benefits. Some of the tests and screenings are paid in full while other may be subject to a deductible, co-insurance, and/or copay.    Some of these benefits include a comprehensive review of your medical history including lifestyle, illnesses that may run in your family, and various assessments and screenings as appropriate.    After reviewing your medical record and screening and assessments performed today your provider may have ordered immunizations, labs, imaging, and/or referrals for you.  A list of these orders (if applicable) as well as your Preventive Care list are included within your After Visit Summary for your review.    Other Preventive Recommendations:    A preventive eye exam performed by an eye specialist is recommended every 1-2 years to screen for glaucoma; cataracts, macular degeneration, and other eye disorders.  A preventive dental visit is recommended every 6 months.  Try to get at least 150 minutes of exercise per week or 10,000 steps per day on a pedometer .  Order or download the FREE "Exercise & Physical Activity: Your Everyday Guide" from [employer]. Call (726) [phone] or search [employer] online.  You need 1200-1500 mg of calcium and 1000-2000 IU of vitamin D per day. It is possible to meet your calcium requirement with diet alone, but a vitamin D supplement is usually necessary to meet this goal.  When exposed to the sun, use a sunscreen that protects against both UVA and UVB radiation with an SPF of 30 or greater. Reapply every 2 to 3 hours or after sweating, drying off with a towel, or swimming.  Always wear a seat belt when traveling in a car. Always wear a helmet when riding a bicycle or motorcycle.

## 2022-02-08 LAB — CBC WITH AUTO DIFFERENTIAL
Absolute Baso #: 0.1 10*3/uL (ref 0.0–0.2)
Absolute Eos #: 0.2 10*3/uL (ref 0.0–0.5)
Absolute Lymph #: 2.9 10*3/uL (ref 1.0–3.2)
Absolute Mono #: 0.9 10*3/uL (ref 0.3–1.0)
Basophils %: 0.7 % (ref 0.0–2.0)
Eosinophils %: 2.7 % (ref 0.0–7.0)
Hematocrit: 42 % (ref 38.0–52.0)
Hemoglobin: 12.8 g/dL — ABNORMAL LOW (ref 13.0–17.3)
Immature Grans (Abs): 0.02 10*3/uL (ref 0.00–0.06)
Immature Granulocytes: 0.2 % (ref 0.0–0.6)
Lymphocytes: 33.9 % (ref 15.0–45.0)
MCH: 20.7 pg — ABNORMAL LOW (ref 27.0–34.5)
MCHC: 30.5 g/dL — ABNORMAL LOW (ref 32.0–36.0)
MCV: 67.9 fL — ABNORMAL LOW (ref 84.0–100.0)
MPV: 10.4 fL (ref 7.2–13.2)
Monocytes: 10.4 % (ref 4.0–12.0)
NRBC Absolute: 0 10*3/uL (ref 0.000–0.012)
NRBC Automated: 0 % (ref 0.0–0.2)
Neutrophils %: 52.1 % (ref 42.0–74.0)
Neutrophils Absolute: 4.4 10*3/uL (ref 1.6–7.3)
Platelets: 281 10*3/uL (ref 140–440)
RBC: 6.19 x10e6/mcL — ABNORMAL HIGH (ref 4.00–5.60)
RDW: 22.6 % — ABNORMAL HIGH (ref 11.0–16.0)
WBC: 8.5 10*3/uL (ref 3.8–10.6)

## 2022-02-08 LAB — MORPHOLOGY CHECK: RBC Morphology: ABNORMAL — AB

## 2022-02-08 LAB — HEMOGLOBIN A1C
Est. Avg. Glucose, WB: 103
Est. Avg. Glucose-calculated: 108
Hemoglobin A1C: 5.2 % (ref 4.0–6.0)

## 2022-02-08 LAB — *Unknown
IMMATURE PLT ABSOLUTE: 7.9 10*3/uL
IMMATURE PLT PERCENT: 2.8 % (ref 1.2–8.6)

## 2022-02-08 NOTE — Other (Signed)
Please update the patient.  CBC: Within normal limits.    Urine grossly normal.  Some bacteria but no leukocyte esterase or nitrites.  CK/muscle enzymes minimally elevated. -We will monitor  Hemoglobin A1c remains normal at 5.2.  No diabetes  Lipid profile: Controlled.  HDL remains good at 60, LDL remains controlled at 81  CMP: Within normal limits  Screening PSA for prostate cancer normal repeat in 1 year

## 2022-02-08 NOTE — Other (Signed)
Called patient his phone say's "were sorry we are unable to complete this call at this time." And hung up. Was unable to LVM. Will try again later.

## 2022-02-11 NOTE — Assessment & Plan Note (Signed)
Remains on aspirin and statin.  A1c within normal limits.  Continuing lifestyle modifications.

## 2022-02-11 NOTE — Other (Signed)
Patient notified of lab results.

## 2022-02-11 NOTE — Assessment & Plan Note (Signed)
Evaluate for urinary tract infection.  Start Flomax.  Monitor for orthostasis, hypotension.

## 2022-02-11 NOTE — Assessment & Plan Note (Signed)
Peak hemoglobinHistory of hemorrhagic stroke.  Likely secondary to uncontrolled hypertension.  Recovering.  On statin, blood pressure medicine.

## 2022-02-11 NOTE — Assessment & Plan Note (Addendum)
Initial lung cancer screening was completed July 2021.  Less than a 4 mm lung nodule was noted but it does not clarify where it was.  Copy of the report noted under overview.  Repeat scan.

## 2022-02-11 NOTE — Assessment & Plan Note (Signed)
Blood pressure is improved.  Continuing amlodipine 10 mg daily.  Still borderline high.

## 2022-02-11 NOTE — Assessment & Plan Note (Signed)
Lipids stable.  Continue Lipitor 40     Latest Reference Range & Units 02/07/22 12:31   Total CK 20 - 200 unit/L 296 (H)   Chol/HDL Ratio 0.0 - 4.4  2.7   Cholesterol, Total 100 - 200 mg/dL 527   HDL Cholesterol >=78 mg/dL 60   LDL Cholesterol 0.0 - 100.0 mg/dL 24.2   LDL/HDL Ratio  1.4   Triglycerides 0 - 149 mg/dL 86   VLDL 5.0 - 35.3 mg/dL 61.4   (H): Data is abnormally high

## 2022-02-11 NOTE — Assessment & Plan Note (Signed)
Status post treatment by infectious disease.  Most recent viral load negative.

## 2022-02-11 NOTE — Assessment & Plan Note (Signed)
Received previous pneumonia 23.  Will need pneumonia 20.    Informs me that his oldest brother has history of prostate cancer and possibly another brother as well.  He is up-to-date on his colonoscopy -Dr. Domingo Cocking October 2022 with polyp removal

## 2022-02-27 NOTE — Telephone Encounter (Addendum)
The patient called today to find out if Dr Reesa Chew filled out and faxed [facility] paperwork [facility]    Please call the patient back to discuss      He states that the facility has not yet received the paperwork yet    Sent to [facility]

## 2022-02-27 NOTE — Telephone Encounter (Signed)
Refaxed documents to [facility]. Tried to notify patient, but VM box is full.

## 2022-02-28 NOTE — Telephone Encounter (Signed)
lmr

## 2022-03-01 NOTE — Telephone Encounter (Signed)
Please call back-- he is returning your call

## 2022-03-01 NOTE — Telephone Encounter (Signed)
Please call and advise      --states his son called [facility] this morning and they said they have NOT received the fax for his paperwork ( states [facility]  is a [employer], agricultural [employer])     Fax --[phone]    phone   [phone]    Will you refax the paperwork???     Sent to [employer]

## 2022-03-01 NOTE — Telephone Encounter (Signed)
I REFAXED AND LET THE SON KNOW.

## 2022-03-01 NOTE — Telephone Encounter (Signed)
LMR

## 2022-03-01 NOTE — Telephone Encounter (Signed)
Patient states that he doesn't have transportation. He will have to arrange transportation three days prior. He needs to reschedule the appointment on 03/04/22.  Please call and advise.    Sent to [facility]

## 2022-03-01 NOTE — Telephone Encounter (Signed)
Pt states that he needs a TB test  before his admittance to the [facility]    Sent  to [employer]

## 2022-03-01 NOTE — Telephone Encounter (Signed)
Pt rescheduled to October 4th.

## 2022-03-04 ENCOUNTER — Encounter: Attending: Internal Medicine | Primary: Internal Medicine

## 2022-03-06 ENCOUNTER — Ambulatory Visit: Admit: 2022-03-06 | Discharge: 2022-03-06 | Payer: MEDICARE | Attending: Internal Medicine | Primary: Internal Medicine

## 2022-03-06 DIAGNOSIS — Z111 Encounter for screening for respiratory tuberculosis: Secondary | ICD-10-CM

## 2022-03-06 NOTE — Progress Notes (Signed)
Patient came into office today for PPD placement.

## 2022-03-08 ENCOUNTER — Encounter: Admit: 2022-03-08 | Discharge: 2022-03-08 | Payer: MEDICARE | Attending: Internal Medicine | Primary: Internal Medicine

## 2022-03-11 NOTE — Telephone Encounter (Signed)
Called and notified them it was re-faxed. She confirmed she got the results.

## 2022-03-11 NOTE — Telephone Encounter (Signed)
--   Please call and advise when this is refaxed     Patients states this is the second time [facility] has not received the fax, will you send it ASAP?    -- Patient states that [facility] never received his results of his PPD test that was read Friday 03/08/2022     --Will you fax it today ?    Fax number - [phone]  Phone number- [phone]    Sent to [facility]

## 2022-03-11 NOTE — Progress Notes (Signed)
PPD placed on right forearm.  Red as negative today.  No induration.  Colon Branch, MD 8:34 AM.03/11/2022

## 2022-03-20 NOTE — Telephone Encounter (Signed)
[facility] has already spoke with him, they said every is okay for him to get him in.

## 2022-03-20 NOTE — Telephone Encounter (Signed)
[facility] is trying to get into [facility] He would like a call back to go over the process and what he may need.

## 2022-03-20 NOTE — Telephone Encounter (Signed)
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 I was told this by Levada Dy the Development worker, international aid. If Dr. Reesa Chew is agreeable to this and submit documentation in writing. This will also allow him to get a grant from the caregiver support organization which will allow him to get money to be able to support the cost. I guess money is an issue for him.

## 2022-04-02 NOTE — Telephone Encounter (Signed)
Patient will like to schedule an appointment for a flu shot. Please call and advise.    Sent to [place]

## 2022-04-03 DIAGNOSIS — Z720 Tobacco use: Secondary | ICD-10-CM | POA: Diagnosis not present

## 2022-04-03 DIAGNOSIS — G8929 Other chronic pain: Secondary | ICD-10-CM | POA: Diagnosis not present

## 2022-04-03 DIAGNOSIS — Z760 Encounter for issue of repeat prescription: Secondary | ICD-10-CM | POA: Diagnosis not present

## 2022-04-03 DIAGNOSIS — M542 Cervicalgia: Secondary | ICD-10-CM | POA: Diagnosis not present

## 2022-04-03 DIAGNOSIS — Z5181 Encounter for therapeutic drug level monitoring: Secondary | ICD-10-CM | POA: Diagnosis not present

## 2022-04-03 DIAGNOSIS — M4727 Other spondylosis with radiculopathy, lumbosacral region: Secondary | ICD-10-CM | POA: Diagnosis not present

## 2022-04-03 DIAGNOSIS — M961 Postlaminectomy syndrome, not elsewhere classified: Secondary | ICD-10-CM | POA: Diagnosis not present

## 2022-04-03 DIAGNOSIS — Z79891 Long term (current) use of opiate analgesic: Secondary | ICD-10-CM | POA: Diagnosis not present

## 2022-04-03 NOTE — Telephone Encounter (Signed)
Pt scheduled.

## 2022-04-04 ENCOUNTER — Encounter: Payer: MEDICARE | Attending: Internal Medicine | Primary: Internal Medicine

## 2022-04-16 ENCOUNTER — Ambulatory Visit: Admit: 2022-04-16 | Discharge: 2022-04-16 | Payer: MEDICARE | Attending: Internal Medicine | Primary: Internal Medicine

## 2022-04-16 DIAGNOSIS — Z23 Encounter for immunization: Secondary | ICD-10-CM

## 2022-04-16 NOTE — Progress Notes (Signed)
Patient came into office today to receive Influenza vaccination. He also brought in his medications because he wanted to know what they were being taken for. Discussed with patient his medications and what they are being used for. Also let patient know which ones have refills left on them, and which ones he would have to call us to refill. Disregarded empty/expired medication bottles for patient. Provided him education sheet on Cholesterol at patient request.

## 2022-04-16 NOTE — Telephone Encounter (Signed)
I documented the patient has dementia.  Want to do a cognitive screen on him when he returns.  Colon Branch, MD 4:30 PM.04/16/2022

## 2022-04-22 DIAGNOSIS — E785 Hyperlipidemia, unspecified: Secondary | ICD-10-CM | POA: Diagnosis not present

## 2022-04-22 DIAGNOSIS — M79604 Pain in right leg: Secondary | ICD-10-CM | POA: Diagnosis not present

## 2022-04-22 DIAGNOSIS — E1169 Type 2 diabetes mellitus with other specified complication: Secondary | ICD-10-CM | POA: Diagnosis not present

## 2022-04-22 DIAGNOSIS — J439 Emphysema, unspecified: Secondary | ICD-10-CM | POA: Diagnosis not present

## 2022-04-22 DIAGNOSIS — I10 Essential (primary) hypertension: Secondary | ICD-10-CM | POA: Diagnosis not present

## 2022-05-01 DIAGNOSIS — Z23 Encounter for immunization: Secondary | ICD-10-CM | POA: Diagnosis not present

## 2022-05-09 ENCOUNTER — Encounter: Payer: MEDICARE | Attending: Internal Medicine | Primary: Internal Medicine

## 2022-05-23 NOTE — Telephone Encounter (Signed)
Please advise     -- Spoke with Almyra Free who stated that she was doing an at home assessment with the pt and stated that she is advising that Dr. Reesa Chew put in a referral for the pt to get set up with physical therapy to help with his strength and better his balance     -- Stating that he had a stroke years back and needs a little help regain strength and balance     If there are any questions you are free to give Almyra Free a call at [phone]    Sent to [place]

## 2022-07-01 DIAGNOSIS — M542 Cervicalgia: Secondary | ICD-10-CM | POA: Diagnosis not present

## 2022-07-01 DIAGNOSIS — G8929 Other chronic pain: Secondary | ICD-10-CM | POA: Diagnosis not present

## 2022-07-01 DIAGNOSIS — Z760 Encounter for issue of repeat prescription: Secondary | ICD-10-CM | POA: Diagnosis not present

## 2022-07-01 DIAGNOSIS — Z79891 Long term (current) use of opiate analgesic: Secondary | ICD-10-CM | POA: Diagnosis not present

## 2022-07-01 DIAGNOSIS — M4727 Other spondylosis with radiculopathy, lumbosacral region: Secondary | ICD-10-CM | POA: Diagnosis not present

## 2022-07-01 DIAGNOSIS — Z720 Tobacco use: Secondary | ICD-10-CM | POA: Diagnosis not present

## 2022-07-01 DIAGNOSIS — M961 Postlaminectomy syndrome, not elsewhere classified: Secondary | ICD-10-CM | POA: Diagnosis not present

## 2022-07-02 ENCOUNTER — Ambulatory Visit: Payer: Medicare Other | Admitting: Student in an Organized Health Care Education/Training Program

## 2022-07-14 ENCOUNTER — Emergency Department
Admission: EM | Admit: 2022-07-14 | Discharge: 2022-07-15 | Disposition: A | Discharge status: Home / Self Care | Payer: Medicare PPO | Attending: Emergency Medicine | Admitting: Emergency Medicine

## 2022-07-14 ENCOUNTER — Emergency Department: Payer: Medicare PPO

## 2022-07-14 DIAGNOSIS — F10920 Alcohol use, unspecified with intoxication, uncomplicated: Secondary | ICD-10-CM

## 2022-07-14 DIAGNOSIS — W19XXXA Unspecified fall, initial encounter: Secondary | ICD-10-CM | POA: Insufficient documentation

## 2022-07-14 DIAGNOSIS — F10129 Alcohol abuse with intoxication, unspecified: Secondary | ICD-10-CM | POA: Insufficient documentation

## 2022-07-14 DIAGNOSIS — R079 Chest pain, unspecified: Secondary | ICD-10-CM

## 2022-07-14 DIAGNOSIS — R55 Syncope and collapse: Secondary | ICD-10-CM | POA: Insufficient documentation

## 2022-07-14 DIAGNOSIS — Y9259 Other trade areas as the place of occurrence of the external cause: Secondary | ICD-10-CM | POA: Insufficient documentation

## 2022-07-14 DIAGNOSIS — Z79899 Other long term (current) drug therapy: Secondary | ICD-10-CM | POA: Insufficient documentation

## 2022-07-14 HISTORY — DX: Cerebral infarction, unspecified: I63.9

## 2022-07-14 LAB — COMPREHENSIVE METABOLIC PANEL
ALT: 32 U/L (ref 0–55)
AST (SGOT): 24 U/L (ref 5–41)
Albumin/Globulin Ratio: 1.1 (ref 0.9–2.2)
Albumin: 4 g/dL (ref 3.5–5.0)
Alkaline Phosphatase: 104 U/L (ref 37–117)
Anion Gap: 9 (ref 5.0–15.0)
BUN: 14 mg/dL (ref 9.0–28.0)
Bilirubin, Total: 0.6 mg/dL (ref 0.2–1.2)
CO2: 22 mEq/L (ref 17–29)
Calcium: 9.6 mg/dL (ref 8.5–10.5)
Chloride: 109 mEq/L (ref 99–111)
Creatinine: 1.1 mg/dL (ref 0.5–1.5)
Globulin: 3.8 g/dL — ABNORMAL HIGH (ref 2.0–3.6)
Glucose: 92 mg/dL (ref 70–100)
Potassium: 4 mEq/L (ref 3.5–5.3)
Protein, Total: 7.8 g/dL (ref 6.0–8.3)
Sodium: 140 mEq/L (ref 135–145)
eGFR: >60 mL/min/{1.73_m2} (ref >=60)

## 2022-07-14 LAB — CBC AND DIFFERENTIAL
Absolute NRBC: 0 10*3/uL (ref 0.00–0.00)
Basophils Absolute Automated: 0.03 10*3/uL (ref 0.00–0.08)
Basophils Automated: 0.3 %
Eosinophils Absolute Automated: 0.24 10*3/uL (ref 0.00–0.44)
Eosinophils Automated: 2.6 %
Hematocrit: 41 % (ref 37.6–49.6)
Hgb: 12.8 g/dL (ref 12.5–17.1)
Immature Granulocytes Absolute: 0.02 10*3/uL (ref 0.00–0.07)
Immature Granulocytes: 0.2 %
Instrument Absolute Neutrophil Count: 4.74 10*3/uL (ref 1.10–6.33)
Lymphocytes Absolute Automated: 3.26 10*3/uL — ABNORMAL HIGH (ref 0.42–3.22)
Lymphocytes Automated: 35.8 %
MCH: 20.5 pg — ABNORMAL LOW (ref 25.1–33.5)
MCHC: 31.2 g/dL — ABNORMAL LOW (ref 31.5–35.8)
MCV: 65.7 fL — ABNORMAL LOW (ref 78.0–96.0)
MPV: 8.8 fL — ABNORMAL LOW (ref 8.9–12.5)
Monocytes Absolute Automated: 0.82 10*3/uL (ref 0.21–0.85)
Monocytes: 9 %
Neutrophils Absolute: 4.74 10*3/uL (ref 1.10–6.33)
Neutrophils: 52.1 %
Nucleated RBC: 0 /100 WBC (ref 0.0–0.0)
Platelets: 263 10*3/uL (ref 142–346)
RBC: 6.24 10*6/uL — ABNORMAL HIGH (ref 4.20–5.90)
RDW: 22 % — ABNORMAL HIGH (ref 11–15)
WBC: 9.11 10*3/uL (ref 3.10–9.50)

## 2022-07-14 LAB — HIGH SENSITIVITY TROPONIN-I: hs Troponin-I: 6.4 ng/L

## 2022-07-14 LAB — CELL MORPHOLOGY
Cell Morphology: ABNORMAL — AB
Platelet Estimate: NORMAL

## 2022-07-14 LAB — NT-PROBNP: NT-proBNP: 47 pg/mL (ref 0–125)

## 2022-07-14 LAB — MAGNESIUM: Magnesium: 2.1 mg/dL (ref 1.6–2.6)

## 2022-07-14 LAB — PT AND APTT
PT INR: 1 (ref 0.9–1.1)
PT: 12.1 s (ref 10.1–12.9)
PTT: 31 s (ref 27–39)

## 2022-07-14 LAB — ETHANOL (ALCOHOL) LEVEL: Alcohol: 200 mg/dL — ABNORMAL HIGH

## 2022-07-14 NOTE — ED Notes (Signed)
Bed: S 10  Expected date:   Expected time:   Means of arrival:   Comments:  RTE after [facility]

## 2022-07-14 NOTE — ED Provider Notes (Signed)
 EMERGENCY DEPT VISIT NOTE    Patient information was obtained from patient    History/Exam limitations: none.    History of Present Illness    Chief Complaint  Fall      Michael White. is a 67 y.o. 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  Patient is visiting from out of town, was at his hotel, admits to multiple alcohol drinks this evening.  Was walking to the bathroom and does not recall anything thereafter.  Denies any associated or prodromal dizziness, palpitations, nausea, diaphoresis, or chest pain.  Since the event denies any headache, vision changes, speech difficulty, confusion, or focal weakness/paresthesias.    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    Borderline hypertension     on meds in past; BP normal without meds recently    Cerebrovascular accident     Hypertension      Family History   Problem Relation Age of Onset    Heart disease Father         unknown what kind but not MI     No current facility-administered medications for this encounter.     Current Outpatient Medications   Medication Sig Dispense Refill    acetaminophen (TYLENOL) 325 MG tablet Take 2 tablets (650 mg total) by mouth every 6 (six) hours as needed for Pain      amLODIPine (NORVASC) 5 MG tablet Take 1 tablet (5 mg total) by mouth daily 30 tablet 3    bisacodyl (DULCOLAX) 5 MG EC tablet Take 2 tablets (10 mg total) by mouth daily as needed for Constipation (second line)      hydrALAZINE (APRESOLINE) 50 MG tablet Take 1 tablet (50 mg total) by mouth every 8 (eight) hours      lactulose (CHRONULAC) 10 GM/15ML solution Take 30 mLs (20 g total) by mouth every 4 (four) hours as needed (constipation, first line)       ondansetron (ZOFRAN) 4 MG tablet Take 1 tablet (4 mg total) by mouth every 8 (eight) hours as needed for Nausea      senna-docusate (PERICOLACE) 8.6-50 MG per tablet Take 2 tablets by mouth daily      sertraline (ZOLOFT) 50 MG tablet Take 1 tablet (50 mg total) by mouth daily      tamsulosin (FLOMAX) 0.4 MG Cap Take 1 capsule (0.4 mg total) by mouth Daily after dinner      traZODone (DESYREL) 50 MG tablet Take 1 tablet (50 mg total) by mouth nightly as needed for Sleep      vitamin D, ergocalciferol, (DRISDOL) 50000 UNIT Cap Take 1 capsule (50,000 Units total) by mouth Once each week on Sunday morning       No Known Allergies  Social History     Socioeconomic History    Marital status: Legally Separated   Tobacco Use    Smoking status: Former     Packs/day: 0.50     Years: 14.00     Additional pack years: 0.00     Total pack years: 7.00     Types: Cigarettes     Quit date: 08/28/2012     Years since quitting: 9.8  Smokeless tobacco: Never   Vaping Use    Vaping Use: Never used   Substance and Sexual Activity    Alcohol use: Yes     Comment: states drinks a couple of times a week, few beers/wine/mixed cocktails    Drug use: No     Social Determinants of Health     Food Insecurity: Patient Declined (07/14/2022)    Hunger Vital Sign     Worried About Running Out of Food in the Last Year: Patient declined     Ran Out of Food in the Last Year: Patient declined   Intimate Partner Violence: Not At Risk (07/14/2022)    Humiliation, Afraid, Rape, and Kick questionnaire     Fear of Current or Ex-Partner: No     Emotionally Abused: No     Physically Abused: No     Sexually Abused: No       Review of Systems  Positive and negative systems as per HPI.  All other systems reviewed and are negative.      Physical Exam  BP 141/89   Pulse 81   Temp 97 F (36.1 C) (Temporal)   Resp 17   SpO2 98%     Nursing note and vitals reviewed  Constitutional: Appears well-developed and well-nourished. No acute distress. Sitting up  comfortably, interactive, speaks clearly & completely  Head: Normocephalic and atraumatic.  No appreciable Battle sign or raccoon eyes  Eyes: Conjunctiva and EOM are normal. PERRL, 3mm bilat  Normal upward gaze  ENT: Nose clear. No redness. MM moist, no tonsillar exudate or asymmetry  Neck: Supple. No tracheal deviation present.  + Midline tenderness no step-off  Cardiovascular: Normal rate, regular rhythm. 2+ rad and DP pulses bilat  Chest wall: Nontender on palpation  Pulmonary: Effort normal. No retractions, no stridor  Abdominal: Soft, No distention. There is no focal tenderness. There is no  rebound and no guarding.  Musculoskeletal: Normal range of motion. No pretib edema  Pelvis stable and nontender  Neurological: Alert and oriented to person, place, and time. Grossly normal sensation and strength.  Mild dysarthria no aphasia  +Lateral beating nystagmus with bilateral gaze  Skin: Skin is warm and dry. No rash noted.    ----    Medications I ordered for patient during their visit:    Medications - No data to display    O2 Sat:  The patient's oxygen saturation was 99 % on room air. This was independently interpreted by me as Normal.     EKG: Personally reviewed and interpreted by me.   Normal sinus 95. No signs of concerning ST depression/elevation, no PR, QRS, or QT prolongation.   Nml Twaves   Poor R wave progression      I personally reviewed and interpreted the patient's most recent medical records    I personally reviewed and interpreted the patient's lab results    Labs Reviewed   CBC AND DIFFERENTIAL - Abnormal; Notable for the following components:       Result Value    RBC 6.24 (*)     MCV 65.7 (*)     MCH 20.5 (*)     MCHC 31.2 (*)     RDW 22 (*)     MPV 8.8 (*)     Lymphocytes Absolute Automated 3.26 (*)     All other components within normal limits   COMPREHENSIVE METABOLIC PANEL - Abnormal; Notable for the following components:    Globulin 3.8 (*)  All other components within normal limits    ETHANOL - Abnormal; Notable for the following components:    Alcohol 200 (*)     All other components within normal limits   CELL MORPHOLOGY - Abnormal; Notable for the following components:    Cell Morphology Abnormal (*)     Target Cells 2+ (*)     Ovalocytes Present (*)     All other components within normal limits   PT AND APTT   HIGH SENSITIVITY TROPONIN-I   HIGH SENSITIVITY TROPONIN-I WITH DELTA   MAGNESIUM   PROBNP       I personally reviewed and interpreted the patient's imaging results    CT Head WO Contrast   Final Result       1.No acute intracranial abnormality.      Vernie Murders, MD   07/15/2022 12:46 AM      CT Cervical Spine WO Contrast   Final Result       1.No acute findings in the cervical spine.      Vernie Murders, MD   07/15/2022 12:44 AM      XR Chest  AP Portable   Final Result      No acute pulmonary process.      Judd Gaudier, MD   07/14/2022 11:57 PM              ED Course & MDM:     Medical Decision Making  Amount and/or Complexity of Data Reviewed  Labs: ordered.  Radiology: ordered.  ECG/medicine tests: ordered.        Well-appearing elderly gentleman, status post apparent syncopal episode and ground-level fall prior to arrival, complicated by associated alcohol intoxication.  Given patient's age and intoxication, I explained and discussed that he is high risk for associated traumatic injury.  Having said this exam is very benign.  CT scans and x-rays unremarkable.    No signs or symptoms acutely concerning for secondary cardiac or neurologic etiology to his fall    230a -patient doing well on reevaluation, well-appearing and no distress, without complaint or pain.  X-rays and CT scans unremarkable  Ambulated well without ataxia or assistance    All results/findings were reviewed then discussed with both patient and any family present and all questions were answered. I rediscussed in depth precautions for which  to return for immediate reeval, and pt expressed understanding.    Myself &  staff explained that not all medical issues can be fully evaluated, diagnosed, and managed in the emergency dept, and reiterated the importance of outpt followup.  Safe and stable for discharge home with fu as discussed      Diagnosis:   1. Syncope, unspecified syncope type    2. Fall, initial encounter    3. Alcoholic intoxication without complication      Disposition:   ED Disposition       ED Disposition   Discharge    Condition   --    Date/Time   Mon Jul 15, 2022  2:41 AM    Comment   Denna Haggard Sr. discharge to home/self care.    Condition at disposition: Stable               Condition: Stable      Trellis Moment, MD  2:42 AM      *This note was generated by the Epic EMR system/ Dragon speech recognition and may contain inherent errors or omissions not intended by the user.  Grammatical errors, random word insertions, deletions, pronoun errors and incomplete sentences are occasional consequences of this technology due to software limitations. Not all errors are caught or corrected. If there are questions or concerns about the content of this note or information contained within the body of this dictation they should be addressed directly with the author for clarification.Randolm Idol, Hampton Abbot, MD  07/15/22 [phone]

## 2022-07-14 NOTE — ED Triage Notes (Addendum)
67 year old male presents to ED for unwitnessed fall/possible syncope. Patient denies falling and LOC, remembers being in bathtub with legs hanging out. Wife found PT and called 911. PT out of town and was in [place] to watch superbowl. PT states he had a few mixed drinks with tequila. Hx stroke 2021. Aox3, disoriented to situation, PT unsure what happened. Able to maintain airway, speaking in full sentences but slightly slurred, breathing unlabored. Aspen collar on and aligned. Yellow gowned.

## 2022-07-14 NOTE — ED Notes (Signed)
Bed: T 2A  Expected date:   Expected time:   Means of arrival: [id number] [place]  Comments:

## 2022-07-15 ENCOUNTER — Ambulatory Visit: Payer: Medicare Other | Admitting: Student in an Organized Health Care Education/Training Program

## 2022-07-15 LAB — ECG 12-LEAD
Atrial Rate: 95 {beats}/min
IHS MUSE NARRATIVE AND IMPRESSION: NORMAL
P Axis: 77 degrees
P-R Interval: 156 ms
Q-T Interval: 356 ms
QRS Duration: 70 ms
QTC Calculation (Bezet): 447 ms
R Axis: -57 degrees
T Axis: 37 degrees
Ventricular Rate: 95 {beats}/min

## 2022-07-15 LAB — HIGH SENSITIVITY TROPONIN-I WITH DELTA
hs Troponin-I Delta: -1.8 ng/L
hs Troponin-I: 4.6 ng/L

## 2022-07-15 NOTE — ED Notes (Addendum)
Patient ambulating independently with steady gait with this RN. [place] made aware via SecureChat.

## 2022-07-15 NOTE — ED Notes (Signed)
Social work called to help arrange for transportation back to [employer]. SW will be called again when patient is in lobby to arrange for cab.

## 2022-07-29 NOTE — Telephone Encounter (Signed)
Patient scheduled for March 6th.

## 2022-07-29 NOTE — Telephone Encounter (Signed)
Patient would like a call back to reschedule his last missed appointment.

## 2022-08-01 ENCOUNTER — Ambulatory Visit: Payer: Medicare Other | Admitting: Student in an Organized Health Care Education/Training Program

## 2022-08-05 DIAGNOSIS — J439 Emphysema, unspecified: Secondary | ICD-10-CM | POA: Diagnosis not present

## 2022-08-05 DIAGNOSIS — U071 COVID-19: Secondary | ICD-10-CM | POA: Diagnosis not present

## 2022-08-07 ENCOUNTER — Encounter

## 2022-08-07 ENCOUNTER — Ambulatory Visit: Admit: 2022-08-07 | Discharge: 2022-08-07 | Payer: MEDICARE | Attending: Internal Medicine | Primary: Internal Medicine

## 2022-08-07 DIAGNOSIS — N529 Male erectile dysfunction, unspecified: Secondary | ICD-10-CM

## 2022-08-07 LAB — COMPREHENSIVE METABOLIC PANEL
ALT: 19 U/L (ref 0–50)
AST: 19 U/L (ref 0–50)
Albumin/Globulin Ratio: 1.7 (ref 1.00–2.70)
Albumin: 4.8 g/dL (ref 3.5–5.2)
Alk Phosphatase: 107 U/L (ref 40–130)
Anion Gap: 12 mmol/L (ref 2–17)
BUN: 12 mg/dL (ref 8–23)
CO2: 26 mmol/L (ref 22–29)
Calcium: 10.1 mg/dL (ref 8.8–10.2)
Chloride: 102 mmol/L (ref 98–107)
Creatinine: 0.9 mg/dL (ref 0.7–1.3)
Est, Glom Filt Rate: 93 mL/min/1.73m (ref >=60)
Globulin: 2.9 g/dL (ref 1.9–4.4)
Glucose: 96 mg/dL (ref 70–99)
OSMOLALITY CALCULATED: 279 mOsm/kg (ref 270–287)
Potassium: 4 mmol/L (ref 3.5–5.3)
Sodium: 140 mmol/L (ref 135–145)
Total Bilirubin: 0.84 mg/dL (ref 0.00–1.20)
Total Protein: 7.7 g/dL (ref 6.4–8.3)

## 2022-08-07 LAB — LIPID PANEL
Chol/HDL Ratio: 2.5 (ref 0.0–4.4)
Cholesterol: 138 mg/dL (ref 100–200)
HDL: 56 mg/dL (ref >=40)
LDL Cholesterol: 68.6 mg/dL (ref 0.0–100.0)
LDL/HDL Ratio: 1.2
Triglycerides: 67 mg/dL (ref 0–149)
VLDL: 13.4 mg/dL (ref 5.0–40.0)

## 2022-08-07 LAB — CK: Total CK: 153 U/L (ref 20–200)

## 2022-08-07 MED ORDER — ATORVASTATIN CALCIUM 40 MG PO TABS
40 | ORAL_TABLET | Freq: Every day | ORAL | 3 refills | Status: AC
Start: 2022-08-07 — End: ?

## 2022-08-07 MED ORDER — MELOXICAM 15 MG PO TABS
15 | ORAL_TABLET | Freq: Every day | ORAL | 0 refills | Status: AC
Start: 2022-08-07 — End: 2022-11-05

## 2022-08-07 MED ORDER — AMLODIPINE BESYLATE 10 MG PO TABS
10 | ORAL_TABLET | Freq: Every day | ORAL | 3 refills | Status: AC
Start: 2022-08-07 — End: 2023-08-02

## 2022-08-07 MED ORDER — TADALAFIL 20 MG PO TABS
20 | ORAL_TABLET | Freq: Every day | ORAL | 1 refills | Status: AC | PRN
Start: 2022-08-07 — End: 2023-02-03

## 2022-08-07 NOTE — Patient Instructions (Signed)
Trial of Cialis 20 mg prior to sexual activity.  Will need to use several hours before hand.  If you do not achieve appropriate erection you can take Viagra 50 mg pill to see both will help.   Always advise any doctors that you are on Cialis and/or Viagra and the last time you took it.  Recommend contacting your caseworker regarding changing Medicaid if you end up moving  Blood work done today.  Will do annual wellness visit in 6 months.

## 2022-08-07 NOTE — Progress Notes (Signed)
Chief Complaint  Chief Complaint   Patient presents with    Follow-up       HISTORY OF PRESENT ILLNESS  67 y.o. male presents today for follow-up on:   Patient presents for his follow-up.  He missed his 42-monthfollow-up and therefore this is a 624-monthollow-up.  Reviewed his medical records since his last visit with me.  He was in the hospital in [place] 10[place].  Patient does not recall the events of going to the emergency department initially.  Reviewed the notes and discussed with him that his ex-wife found him in the bathtub unresponsive.  She called EMS he is transported to the hospital.  There he was evaluated for questionable syncope/loss of consciousness.  He was identified to have an elevated ethanol level of 200.  Patient was notably intoxicated which is likely why does not recall those events.  Otherwise states has been doing well.  Has no other new concerns.  States that he has been walking and generally more than he was previously.  Tries to get in a half a mile a day.  Walks about stop on a regular basis.  His weight is remained stable.  Blood pressure is currently controlled.  [place] is thinking about [place] back [place].  We discussed the importance of alcohol utilization reduction.  Reviewed all of his current medications.  At the end of the appointment patient wanted to discuss his erectile dysfunction.  He has been utilizing sildenafil with no improvement in his symptoms.  He has been on 50 mg tablets since tried up to 100 mg.  Advised to trial Cialis  HPI   Assessment & Plan      1. Vasculogenic erectile dysfunction, unspecified vasculogenic erectile dysfunction type  Assessment & Plan:  Previously on sildenafil 50 mg daily has not had significant improvement.  Has tried up to 100 mg with no significant improvement.  Advised to trial Cialis 20 mg daily.  If no improvement can add 50 mg of sildenafil 30 minutes prior to sexual activity.  Monitor for any hypotension  headaches, chest discomfort.  Advised physicians or emergency personnel that you utilize this medicine if utilized in the past 48 hours.    Orders:  -     tadalafil (CIALIS) 20 MG tablet; Take 1 tablet by mouth daily as needed for Erectile Dysfunction, Disp-90 tablet, R-1Normal  2. Essential hypertension  Overview:  Controlled-stable on current medical regimen  Amlodipine 10 mg daily  Orders:  -     amLODIPine (NORVASC) 10 MG tablet; Take 1 tablet by mouth daily, Disp-90 tablet, R-3Normal  3. Dyslipidemia with elevated low density lipoprotein (LDL) cholesterol and abnormally low high density lipoprotein (HDL) cholesterol  Overview:  Controlled-stable on current medicines  Lipitor 40 mg daily  Assessment & Plan:   Latest Reference Range & Units 08/07/22 12:08   Total CK 20 - 200 unit/L 153   Chol/HDL Ratio 0.0 - 4.4  2.5   Cholesterol, Total 100 - 200 mg/dL 138   HDL Cholesterol >=40 mg/dL 56   LDL Cholesterol 0.0 - 100.0 mg/dL 68.6   LDL/HDL Ratio  1.2   Triglycerides 0 - 149 mg/dL 67   VLDL 5.0 - 40.0 mg/dL 13.4      Orders:  -     atorvastatin (LIPITOR) 40 MG tablet; Take 1 tablet by mouth daily, Disp-90 tablet, R-3Normal  -     CK; Future  -     Comprehensive Metabolic Panel;  Future  -     Lipid Panel; Future  4. Chronic left shoulder pain  -     meloxicam (MOBIC) 15 MG tablet; Take 1 tablet by mouth daily, Disp-90 tablet, R-0Normal  5. 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 Solitary pulmonary nodule  Overview:  FINDINGS:   Lung Screening Specific Findings:At least one micronodule (diameter < 4 mm) is present.     Potentially Significant Incidental Finding (Category S):   None.     Pulmonary Incidentals:   None.     Other Incidentals:    Aortic atherosclerotic calcification.       IMPRESSION:   1. ACR LungRADS 2: Negative - benign appearance/behavior.     2. ACR Category S: Negative - No new/unknown potentially significant incidental finding requiring additional evaluation.       OVERALL ASSESSMENT:   ACR LungRADS 2: Continued routine annual LDCT lung screening in 12 months.     Signed by: Donny Pique M.D.    Signed on: 12/22/2019 10:13 PM EDT     Assessment & Plan:   Initial lung cancer screening was completed July 2021.  Less than a 4 mm lung nodule was noted but it does not clarify where it was.  Copy of the report noted under overview.    Patient has not completed his repeat CT lung screening scan.  Advised to complete            08/07/2022    11:12 AM 02/07/2022    10:30 AM   PHQ Scores   PHQ2 Score 0 1   PHQ9 Score 0 1     Interpretation of Total Score Depression Severity: 1-4 = Minimal depression, 5-9 = Mild depression, 10-14 = Moderate depression, 15-19 = Moderately severe depression, 20-27 = Severe depression   The diagnoses and plan were discussed with the patient, who verbalizes understanding and agrees with plan.  All questions answered.      Patient Instructions   Trial of Cialis 20 mg prior to sexual activity.  Will need to use several hours before hand.  If you do not achieve appropriate erection you can take Viagra 50 mg pill to see both will help.   Always advise any doctors that you are on Cialis and/or Viagra and the last time you took it.  Recommend contacting your caseworker regarding changing Medicaid if you end up moving  Blood work done today.  Will do annual wellness visit in 6 months.    Active medications at end of Visit  Current Outpatient Medications   Medication Sig Dispense Refill    amLODIPine (NORVASC) 10 MG tablet Take 1 tablet by mouth daily 90 tablet 3    atorvastatin (LIPITOR) 40 MG tablet Take 1 tablet by mouth daily 90 tablet 3    meloxicam (MOBIC) 15 MG tablet Take 1 tablet by mouth daily 90 tablet 0    tadalafil  (CIALIS) 20 MG tablet Take 1 tablet by mouth daily as needed for Erectile Dysfunction 90 tablet 1    vitamin D (ERGOCALCIFEROL) 1.25 MG (50000 UT) CAPS capsule Take 1 capsule by mouth      tamsulosin (FLOMAX) 0.4 MG capsule Take 1 capsule by mouth daily 90 capsule 1    sildenafil (VIAGRA) 50 MG tablet  Take 1 tablet by mouth as needed      aspirin 325 MG EC tablet 1 tablet Orally Once a day for 30 day(s)       No current facility-administered medications for this visit.          Return for Keep Scheduled Follow-up [facility].       REVIEW OF SYSTEMS  Review of Systems   Constitutional:  Positive for fatigue. Negative for appetite change, chills, fever and unexpected weight change.   HENT:  Negative for hearing loss and sore throat.    Eyes:  Negative for pain, discharge and visual disturbance.   Respiratory:  Negative for cough and shortness of breath.    Cardiovascular:  Negative for chest pain and palpitations.   Gastrointestinal:  Negative for abdominal pain, diarrhea, nausea and vomiting.   Endocrine: Negative for cold intolerance, heat intolerance, polydipsia and polyuria.   Genitourinary:  Positive for frequency. Negative for difficulty urinating, dysuria and hematuria.   Musculoskeletal:  Positive for gait problem. Negative for arthralgias.   Skin:  Negative for rash.   Neurological:  Positive for weakness. Negative for dizziness and syncope.   Hematological:  Negative for adenopathy.        PHYSICAL EXAMINATION  Vitals:    08/07/22 1111   BP: 130/83   Pulse: 75   SpO2: 99%   Weight: 71.3 kg (157 lb 3.2 oz)   Height: 1.727 m ('5\' 8"'$ )     Body mass index is 23.9 kg/m.    Physical Exam  Vitals reviewed.   Constitutional:       Appearance: Normal appearance.   HENT:      Head: Normocephalic and atraumatic.      Right Ear: Tympanic membrane normal.      Left Ear: Tympanic membrane normal.      Nose: Nose normal.      Mouth/Throat:      Mouth: Mucous membranes are moist.   Cardiovascular:      Rate and Rhythm: Normal  rate. Rhythm irregular.   Pulmonary:      Effort: Pulmonary effort is normal.      Breath sounds: Normal breath sounds.   Abdominal:      General: Bowel sounds are normal.      Palpations: Abdomen is soft.   Musculoskeletal:         General: Normal range of motion.      Cervical back: Normal range of motion.   Skin:     General: Skin is warm.   Neurological:      General: No focal deficit present.      Mental Status: He is alert and oriented to person, place, and time. Mental status is at baseline.   Psychiatric:         Mood and Affect: Mood normal.         Behavior: Behavior normal.         Thought Content: Thought content normal.         MEDICATIONS at [facility] of Visit  Current Outpatient Medications on File Prior to Visit   Medication Sig Dispense Refill    vitamin D (ERGOCALCIFEROL) 1.25 MG (50000 UT) CAPS capsule Take 1 capsule by mouth      tamsulosin (FLOMAX) 0.4 MG capsule Take 1 capsule by mouth daily 90 capsule 1    sildenafil (VIAGRA) 50 MG tablet Take 1 tablet by mouth as needed      aspirin 325 MG EC tablet 1 tablet Orally  Once a day for 30 day(s)       No current facility-administered medications on file prior to visit.       ALLERGIES / INTOLERANCES  No Known Allergies    PERTINENT LABS AND IMAGING  Lab Results   Component Value Date/Time    WBC 8.5 02/07/2022 12:31 PM    HGB 12.8 (L) 02/07/2022 12:31 PM    PLT 281 02/07/2022 12:31 PM    MCV 67.9 (L) 02/07/2022 12:31 PM    NEUTROABS 4.4 02/07/2022 12:31 PM

## 2022-08-08 NOTE — Assessment & Plan Note (Signed)
 Initial lung cancer screening was completed July 2021.  Less than a 4 mm lung nodule was noted but it does not clarify where it was.  Copy of the report noted under overview.    Patient has not completed his repeat CT lung screening scan.  Advised to complete

## 2022-08-08 NOTE — Assessment & Plan Note (Signed)
 Latest Reference Range & Units 08/07/22 12:08   Total CK 20 - 200 unit/L 153   Chol/HDL Ratio 0.0 - 4.4  2.5   Cholesterol, Total 100 - 200 mg/dL 861   HDL Cholesterol >=59 mg/dL 56   LDL Cholesterol 0.0 - 100.0 mg/dL 31.3   LDL/HDL Ratio  1.2   Triglycerides 0 - 149 mg/dL 67   VLDL 5.0 - 59.9 mg/dL 13.4

## 2022-08-08 NOTE — Assessment & Plan Note (Signed)
 Previously on sildenafil  50 mg daily has not had significant improvement.  Has tried up to 100 mg with no significant improvement.  Advised to trial Cialis  20 mg daily.  If no improvement can add 50 mg of sildenafil  30 minutes prior to sexual activity.  Monitor for any hypotension headaches, chest discomfort.  Advised physicians or emergency personnel that you utilize this medicine if utilized in the past 48 hours.

## 2022-08-08 NOTE — Other (Signed)
Please call and update the patient  Labs all look good.  Continue current medicines.  Lipid profile controlled on current medicine  Kidney function, electrolytes, liver function test all within normal limits  Muscle enzymes normal

## 2022-08-21 DIAGNOSIS — Z139 Encounter for screening, unspecified: Secondary | ICD-10-CM | POA: Diagnosis not present

## 2022-08-21 DIAGNOSIS — I10 Essential (primary) hypertension: Secondary | ICD-10-CM | POA: Diagnosis not present

## 2022-08-21 DIAGNOSIS — E785 Hyperlipidemia, unspecified: Secondary | ICD-10-CM | POA: Diagnosis not present

## 2022-08-21 DIAGNOSIS — J439 Emphysema, unspecified: Secondary | ICD-10-CM | POA: Diagnosis not present

## 2022-08-21 DIAGNOSIS — E1169 Type 2 diabetes mellitus with other specified complication: Secondary | ICD-10-CM | POA: Diagnosis not present

## 2022-08-22 ENCOUNTER — Ambulatory Visit: Payer: Medicare Other | Admitting: Student in an Organized Health Care Education/Training Program

## 2022-09-30 DIAGNOSIS — Z720 Tobacco use: Secondary | ICD-10-CM | POA: Diagnosis not present

## 2022-09-30 DIAGNOSIS — Z79891 Long term (current) use of opiate analgesic: Secondary | ICD-10-CM | POA: Diagnosis not present

## 2022-09-30 DIAGNOSIS — M961 Postlaminectomy syndrome, not elsewhere classified: Secondary | ICD-10-CM | POA: Diagnosis not present

## 2022-12-20 DIAGNOSIS — R809 Proteinuria, unspecified: Secondary | ICD-10-CM | POA: Diagnosis not present

## 2022-12-20 DIAGNOSIS — I10 Essential (primary) hypertension: Secondary | ICD-10-CM | POA: Diagnosis not present

## 2022-12-20 DIAGNOSIS — Z79899 Other long term (current) drug therapy: Secondary | ICD-10-CM | POA: Diagnosis not present

## 2022-12-20 DIAGNOSIS — Z9181 History of falling: Secondary | ICD-10-CM | POA: Diagnosis not present

## 2022-12-20 DIAGNOSIS — R82998 Other abnormal findings in urine: Secondary | ICD-10-CM | POA: Diagnosis not present

## 2022-12-20 DIAGNOSIS — J439 Emphysema, unspecified: Secondary | ICD-10-CM | POA: Diagnosis not present

## 2022-12-20 DIAGNOSIS — R822 Biliuria: Secondary | ICD-10-CM | POA: Diagnosis not present

## 2022-12-20 DIAGNOSIS — E1129 Type 2 diabetes mellitus with other diabetic kidney complication: Secondary | ICD-10-CM | POA: Diagnosis not present

## 2022-12-20 DIAGNOSIS — E785 Hyperlipidemia, unspecified: Secondary | ICD-10-CM | POA: Diagnosis not present

## 2022-12-30 DIAGNOSIS — Z79891 Long term (current) use of opiate analgesic: Secondary | ICD-10-CM | POA: Diagnosis not present

## 2022-12-30 DIAGNOSIS — Z720 Tobacco use: Secondary | ICD-10-CM | POA: Diagnosis not present

## 2022-12-30 DIAGNOSIS — Z5181 Encounter for therapeutic drug level monitoring: Secondary | ICD-10-CM | POA: Diagnosis not present

## 2022-12-30 DIAGNOSIS — M4727 Other spondylosis with radiculopathy, lumbosacral region: Secondary | ICD-10-CM | POA: Diagnosis not present

## 2022-12-30 DIAGNOSIS — M961 Postlaminectomy syndrome, not elsewhere classified: Secondary | ICD-10-CM | POA: Diagnosis not present

## 2023-01-27 NOTE — Telephone Encounter (Signed)
Called patient and rescheduled him for 09/09 at 3:00 pm.

## 2023-01-27 NOTE — Telephone Encounter (Signed)
Pt called to r/s his  02/13/23 appt       Sent to [employer]

## 2023-02-10 ENCOUNTER — Ambulatory Visit: Admit: 2023-02-10 | Discharge: 2023-02-10 | Payer: MEDICARE | Attending: Internal Medicine | Primary: Internal Medicine

## 2023-02-10 VITALS — BP 141/83 | HR 85 | Temp 98.30000°F | Ht 67.99 in | Wt 166.2 lb

## 2023-02-10 DIAGNOSIS — I10 Essential (primary) hypertension: Secondary | ICD-10-CM

## 2023-02-10 MED ORDER — ATORVASTATIN CALCIUM 40 MG PO TABS
40 MG | ORAL_TABLET | Freq: Every day | ORAL | 3 refills | Status: DC
Start: 2023-02-10 — End: 2023-09-15

## 2023-02-10 MED ORDER — CLOTRIMAZOLE 1 % EX CREA
1 | CUTANEOUS | 1 refills | Status: AC
Start: 2023-02-10 — End: 2023-02-24

## 2023-02-10 MED ORDER — AMLODIPINE BESYLATE 10 MG PO TABS
10 MG | ORAL_TABLET | Freq: Every day | ORAL | 3 refills | Status: DC
Start: 2023-02-10 — End: 2023-09-15

## 2023-02-10 MED ORDER — MELOXICAM 15 MG PO TABS
15 MG | ORAL_TABLET | Freq: Every day | ORAL | 0 refills | Status: DC
Start: 2023-02-10 — End: 2023-09-15

## 2023-02-10 NOTE — Progress Notes (Signed)
 Medicare Annual Wellness Visit    Chief Complaint  Chief Complaint   Patient presents with    Medicare AWV       HISTORY OF PRESENT ILLNESS  67 y.o. male presents today for follow-up on: Patient presents for his annual visit.  We also discussed his history of stroke, pulmonary nodule, dyslipidemia, hypertension.  Patient is currently still living in [place] with his nephew.  He traveled here to see me.  He would like to establish care closer to home.  He continues to have diffuse arthritic pain.  He has had some confusion with what medications he should be taking and have clarified those on his AVS.  He informs me that his oldest brother has a history of prostate cancer and possibly another brother as well.  He continues to have some gait instability with a shuffling gait.  Not participating with any therapy recently.  Continues to have difficulty with erectile dysfunction likely multifactorial vascular disease -history of tobacco use, peripheral vascular disease, hyperlipidemia hypertension.  Patient has not been compliant with all of his blood pressure medicines or his other medicines as well.  Denies any chest pain shortness of breath nausea vomiting abdominal pain diarrhea constipation.    Advance Care Planning   HCPOA:Son - Michael Ho  Living Will:No  Code Status:Full Code       PSA screening needs update-Brother has prostate cancer  Colonoscopy: Up-to-date with colonoscopy by Dr. Stevenson October 2022 polyp removal  CT lung screening  Flu vaccine: Recommended  RSV vaccine: At age at age 33 based on history of stroke and cardiovascular disease  COVID-vaccine: Recommended  Tetanus: Due April 2027  Pneumonia: Updated     Assessment & Plan      1. Essential hypertension  Overview:  Controlled-stable on current medical regimen  Amlodipine  10 mg daily  Assessment & Plan:  Blood pressure remains borderline high continuing amlodipine  10 mg daily.  Consider adding ACE inhibitor if not improved in 3 months    Orders:  -     TSH with Reflex; Future  -     amLODIPine  (NORVASC ) 10 MG tablet; Take 1 tablet by mouth daily, Disp-90 tablet, R-3Normal  -     External Referral To Home Health  2. Dyslipidemia with elevated low density lipoprotein (LDL) cholesterol and abnormally low high density lipoprotein (HDL) cholesterol  Overview:  Controlled-stable on current medicines  Lipitor 40 mg daily  Assessment & Plan:  Follow-up on lipids.  Tolerating Lipitor.    Orders:  -     CK; Future  -     Comprehensive Metabolic Panel; Future  -     Lipid Panel; Future  -     TSH with Reflex; Future  -     atorvastatin  (LIPITOR) 40 MG tablet; Take 1 tablet by mouth daily, Disp-90 tablet, R-3Normal  3. Abnormal prostate by palpation  Assessment & Plan:  Referral to urology for further evaluation   Orders:  -     Amb External Referral To Urology  -     External Referral To Urology  4. Cerebrovascular accident (CVA) of right thalamus (HCC)  Overview:  Controlled-stable on current medical regimen  History of hemorrhagic stroke secondary to uncontrolled hypertension-has chronic gait instability but otherwise recovered/stable  Aspirin 325 daily  Lipitor 40 mg daily  Blood pressure management    Orders:  -     External Referral To Home Health  5. Solitary pulmonary nodule  Overview:  FINDINGS:  Lung Screening Specific Findings:At least one micronodule (diameter < 4 mm) is present.     Potentially Significant Incidental Finding (Category S):   None.     Pulmonary Incidentals:   None.     Other Incidentals:   Aortic atherosclerotic calcification.       IMPRESSION:   1. ACR LungRADS 2: Negative - benign appearance/behavior.     2. ACR Category S: Negative - No new/unknown potentially significant incidental finding requiring additional evaluation.       OVERALL ASSESSMENT:   ACR LungRADS 2: Continued routine annual LDCT lung screening in 12 months.     Signed by: Mancel Lites M.D.    Signed on: 12/22/2019 10:13 PM EDT     Assessment & Plan:  Needs  follow-up CT lung screening.    6. Personal history of tobacco use  Assessment & Plan:  Quit smoking in 2020.  Needs continued CT lung screening.  Orders:  -     PR VISIT TO DISCUSS LUNG CA SCREEN W LDCT  -     CT Lung Screen (Initial/Annual/Baseline); Future  7. Hyperglycemia  -     Hemoglobin A1C; Future  8. Microcytosis  Assessment & Plan:  Chronic stable microcytosis with normal hemoglobin.  Likely has thalassemia.   Orders:  -     CBC with Auto Differential; Future  9. Screening PSA (prostate specific antigen)  -     PSA Screening; Future  10. Medicare annual wellness visit, subsequent  Overview:  Advance Care Planning   HCPOA:Son - Michael Ho  Living Will:No  Code Status:Full Code       PSA screening needs update-Brother has prostate cancer  Colonoscopy: Up-to-date with colonoscopy by Dr. Stevenson October 2022 polyp removal  CT lung screening  Flu vaccine: Recommended  RSV vaccine: At age at age 34 based on history of stroke and cardiovascular disease  COVID-vaccine: Recommended  Tetanus: Due April 2027  Pneumonia: Updated   11. Need for pneumococcal 20-valent conjugate vaccination  -     Pneumococcal, PCV20, PREVNAR 20, (age 6w+), IM, PF  12. Onychomycosis of toenail  13. Tinea pedis of both feet  -     clotrimazole  (LOTRIMIN ) 1 % cream; Apply topically 2 times daily., Disp-60 g, R-1, Normal  -     External Referral To Home Health  14. Chronic left shoulder pain  Assessment & Plan:  Continuing Mobic  for pain management monitoring creatinine  Follow-up with Ortho as needed   Orders:  -     meloxicam  (MOBIC ) 15 MG tablet; Take 1 tablet by mouth daily, Disp-90 tablet, R-0Normal  -     External Referral To Home Health  15. Shuffling gait -with gait instability  Assessment & Plan:  Resume physical therapy.    Orders:  -     External Referral To Home Health  16. 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  A1c within normal limits.   Continuing lifestyle modifications.   Orders:  -     External Referral To Home Health     Depression screen completed as part of subsequent annual wellness visit. Time spent 5 minutes to acquire information, review information, and documenting to the system.          02/10/2023     2:50 PM 08/07/2022    11:12 AM 02/07/2022    10:30 AM   PHQ Scores   PHQ2 Score 0 0  1   PHQ9 Score 0 0 1     Interpretation of Total Score Depression Severity: 1-4 = Minimal depression, 5-9 = Mild depression, 10-14 = Moderate depression, 15-19 = Moderately severe depression, 20-27 = Severe depression   Interpretation of Total Score Depression Severity: 1-4 = Minimal depression, 5-9 = Mild depression, 10-14 = Moderate depression, 15-19 = Moderately severe depression, 20-27 = Severe depression   The diagnoses and plan were discussed with the patient, who verbalizes understanding and agrees with plan.  All questions answered.    I spent at least 43 minutes on this visit with this established patient.  Independent of the AWV.  Discussed his numerous medical problems and time facilitating organizing follow-up as he was in [facility] now.      Active medications at end of Visit  Current Outpatient Medications   Medication Sig Dispense Refill    amLODIPine  (NORVASC ) 10 MG tablet Take 1 tablet by mouth daily 90 tablet 3    atorvastatin  (LIPITOR) 40 MG tablet Take 1 tablet by mouth daily 90 tablet 3    meloxicam  (MOBIC ) 15 MG tablet Take 1 tablet by mouth daily 90 tablet 0    aspirin 325 MG EC tablet 1 tablet Orally Once a day for 30 day(s)       No current facility-administered medications for this visit.          Return in 1 year (on 02/10/2024) for Medicare AWV, 3 month routine VV.       REVIEW OF SYSTEMS  Review of Systems   Genitourinary:  Positive for urgency.   Musculoskeletal:  Positive for arthralgias, back pain, gait problem and myalgias.        PHYSICAL EXAMINATION  Vitals:    02/10/23 1440   BP: (!) 141/83   Pulse: 85   Temp: 98.3 F (36.8  C)   SpO2: 99%   Weight: 75.4 kg (166 lb 3.2 oz)   Height: 1.727 m (5' 7.99)     Body mass index is 25.28 kg/m.    Physical Exam  Constitutional:       Appearance: Normal appearance. He is normal weight.   HENT:      Head: Normocephalic and atraumatic.      Right Ear: External ear normal.      Left Ear: External ear normal.      Nose: Nose normal.      Mouth/Throat:      Mouth: Mucous membranes are moist.      Pharynx: Oropharynx is clear.   Eyes:      Conjunctiva/sclera: Conjunctivae normal.      Pupils: Pupils are equal, round, and reactive to light.   Cardiovascular:      Rate and Rhythm: Normal rate. Rhythm irregular.      Pulses: Normal pulses.      Heart sounds: Normal heart sounds.   Pulmonary:      Effort: Pulmonary effort is normal.      Breath sounds: Normal breath sounds.   Abdominal:      General: Bowel sounds are normal.      Palpations: Abdomen is soft.   Musculoskeletal:         General: Normal range of motion.      Cervical back: Normal range of motion and neck supple.   Skin:     General: Skin is warm and dry.      Capillary Refill: Capillary refill takes less than 2 seconds.   Neurological:  General: No focal deficit present.      Mental Status: He is alert and oriented to person, place, and time. Mental status is at baseline.   Psychiatric:         Mood and Affect: Mood normal.         Behavior: Behavior normal.         Thought Content: Thought content normal.         Judgment: Judgment normal.         MEDICATIONS at [facility] of Visit  Current Outpatient Medications on File Prior to Visit   Medication Sig Dispense Refill    aspirin 325 MG EC tablet 1 tablet Orally Once a day for 30 day(s)       No current facility-administered medications on file prior to visit.       ALLERGIES / INTOLERANCES  No Known Allergies    PERTINENT LABS AND IMAGING  Lab Results   Component Value Date/Time    WBC 8.5 02/07/2022 12:31 PM    HGB 12.8 (L) 02/07/2022 12:31 PM    PLT 281 02/07/2022 12:31 PM    MCV 67.9 (L)  02/07/2022 12:31 PM    NEUTROABS 4.4 02/07/2022 12:31 PM         Medicare Annual Wellness Visit    Michael Pines Sr. is here for Medicare AWV    Assessment & Plan   Essential hypertension  Assessment & Plan:  Blood pressure remains borderline high continuing amlodipine  10 mg daily.  Consider adding ACE inhibitor if not improved in 3 months   Orders:  -     TSH with Reflex; Future  -     amLODIPine  (NORVASC ) 10 MG tablet; Take 1 tablet by mouth daily, Disp-90 tablet, R-3Normal  -     External Referral To Home Health  Dyslipidemia with elevated low density lipoprotein (LDL) cholesterol and abnormally low high density lipoprotein (HDL) cholesterol  Assessment & Plan:  Follow-up on lipids.  Tolerating Lipitor.    Orders:  -     CK; Future  -     Comprehensive Metabolic Panel; Future  -     Lipid Panel; Future  -     TSH with Reflex; Future  -     atorvastatin  (LIPITOR) 40 MG tablet; Take 1 tablet by mouth daily, Disp-90 tablet, R-3Normal  Abnormal prostate by palpation  Assessment & Plan:  Referral to urology for further evaluation   Orders:  -     Amb External Referral To Urology  -     External Referral To Urology  Cerebrovascular accident (CVA) of right thalamus [facility])  -     External Referral To Home Health  Solitary pulmonary nodule  Assessment & Plan:  Needs follow-up CT lung screening.    Personal history of tobacco use  Assessment & Plan:  Quit smoking in 2020.  Needs continued CT lung screening.  Orders:  -     PR VISIT TO DISCUSS LUNG CA SCREEN W LDCT  -     CT Lung Screen (Initial/Annual/Baseline); Future  Hyperglycemia  -     Hemoglobin A1C; Future  Microcytosis  Assessment & Plan:  Chronic stable microcytosis with normal hemoglobin.  Likely has thalassemia.   Orders:  -     CBC with Auto Differential; Future  Screening PSA (prostate specific antigen)  -     PSA Screening; Future  Medicare annual wellness visit, subsequent  Need for pneumococcal 20-valent conjugate vaccination  -  Pneumococcal, PCV20,  PREVNAR 20, (age 6w+), IM, PF  Onychomycosis of toenail  Tinea pedis of both feet  -     clotrimazole  (LOTRIMIN ) 1 % cream; Apply topically 2 times daily., Disp-60 g, R-1, Normal  -     External Referral To Home Health  Chronic left shoulder pain  Assessment & Plan:  Continuing Mobic  for pain management monitoring creatinine  Follow-up with Ortho as needed   Orders:  -     meloxicam  (MOBIC ) 15 MG tablet; Take 1 tablet by mouth daily, Disp-90 tablet, R-0Normal  -     External Referral To Home Health  Shuffling gait -with gait instability  Assessment & Plan:  Resume physical therapy.    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  A1c within normal limits.  Continuing lifestyle modifications.   Orders:  -     External Referral To Home Health    Recommendations for Preventive Services Due: see orders and patient instructions/AVS.  Recommended screening schedule for the next 5-10 years is provided to the patient in written form: see Patient Instructions/AVS.     Return in 1 year (on 02/10/2024) for Medicare AWV, 3 month routine VV.     Subjective     Patient's complete Health Risk Assessment and screening values have been reviewed and are found in Flowsheets. The following problems were reviewed today and where indicated follow up appointments were made and/or referrals ordered.    Positive Risk Factor Screenings with Interventions:    Fall Risk:  Do you feel unsteady or are you worried about falling? : (!) yes  2 or more falls in past year?: (!) yes  Fall with injury in past year?: (!) yes     Interventions:    Reviewed medications, home hazards, visual acuity, and co-morbidities that can increase risk for falls  Referral: Physical Therapy (PT)    Cognitive:   Clock Drawing Test (CDT): (!) Abnormal  Words recalled: 1 Word Recalled  Total Score: (!) 1  Total Score Interpretation: Abnormal  Mini-Cog  Interventions:  Patient declines any further evaluation or treatment          Self-assessment of health:  In general, how would you say your health is?: (!) Poor    Interventions:  Patient advised to follow-up in this office for further evaluation and treatment    General HRA Questions:  Select all that apply: (!) New or Increased Pain, Stress  Interventions - Pain:  See above  Interventions - Stress:  See above      Inactivity:  On average, how many days per week do you engage in moderate to strenuous exercise (like a brisk walk)?: 0 days (!) Abnormal  On average, how many minutes do you engage in exercise at this level?: 0 min  Interventions:  Patient comments: PT, start walking  Recommendations: patient agrees to wear a pedometer and walk at least 10,000 steps/day      Dentist Screen:  Have you seen the dentist within the past year?: (!) No    Intervention:  Advised to schedule with their dentist     Vision Screen:  Interventions:   Patient encouraged to make appointment with their eye specialist     ADL's:   Patient reports needing help with:  Select all that apply: (!) Bathing  Select all that apply: [employer]  Preparation, Transportation, Taking Medications  Interventions:  Referred to: Care Coordination, Home Health, Physical Therapy (PT)    Advanced Directives:  Do you have a Living Will?: (!) No    Intervention:  has NO advanced directive - information provided    Advance Care Planning   Discussed the patient's choices for care and treatment preferences in case of a health event that adversely affects decision-making abilities or is life-limiting. Recommended the patient document care preferences in state-specific advance directives. Also reviewed the process of designating a trusted capable adult as an Agent (or Health Care Power of Gabriella) to make health care decisions for the patient if the patient becomes unable due to incapacity. Patient was asked to complete advance directive forms, if not  already done, and to provide a dated, signed and witnessed (or notarized) copy, per the forms' requirements, to the practice office.    Time spent (minutes): 16 minutes        Lung Cancer Screening:  LDCT Screening: Discussed with patient the benefits and harms of screening, follow-up diagnostic testing, over-diagnosis, false positive rate, and total radiation exposure. Counseled on the importance of adherence to annual lung cancer LDCT screening, impact of comorbidities, ability and willingness to undergo diagnosis and treatment. Counseled on the importance of maintaining cigarette smoking abstinence and cessation. The patient has a history of >20 pack years and is either still smoking or quit within the last 15 years. There are no signs or symptoms of lung cancer.      CV Risk Counseling:  Patient was asked about his current diet and exercise habits, and personalized advice was provided regarding recommended lifestyle changes. Patient's individual cardiovascular disease risk factors, including advanced age (> 31 for men, > 84 for women), dyslipidemia, hypertension, male gender, sedentary lifestyle, and smoking/tobacco exposure, were discussed, as well as the likely benefits of lifestyle changes. Based upon patient's motivation to change his behavior, the following plan was agreed upon to work toward lowering cardiovascular disease risk: low saturated fat, low cholesterol diet, Mediterranean diet, DASH diet, and increase physical activity, as tolerated.  Aspirin use for primary prevention of cardiovascular disease for men 45-79 and women 55-79: Indicated- continue daily aspirin. Educational materials for lifestyle changes were provided. Patient will follow-up in 6 month(s) with PCP. Provider spent 8 minutes counseling patient.            Objective   Vitals:    02/10/23 1440   BP: (!) 141/83   Pulse: 85   Temp: 98.3 F (36.8 C)   SpO2: 99%   Weight: 75.4 kg (166 lb 3.2 oz)   Height: 1.727 m (5' 7.99)      Body  mass index is 25.28 kg/m.                No Known Allergies  Prior to Visit Medications    Medication Sig Taking? Authorizing Provider   amLODIPine  (NORVASC ) 10 MG tablet Take 1 tablet by mouth daily Yes Kylian Loh V, MD   atorvastatin  (LIPITOR) 40 MG tablet Take 1 tablet by mouth daily Yes Kyndall Chaplin V, MD   meloxicam  (MOBIC ) 15 MG tablet Take 1 tablet by mouth daily Yes [place] Lesure V, MD   aspirin 325 MG EC tablet 1 tablet Orally Once a day for 30 day(s) Yes [provider]       CareTeam (Including outside providers/suppliers regularly involved in providing care):   Patient Care Team:  Caleen Maryla GAILS, MD as PCP - General (Internal  Medicine)  Caleen Maryla GAILS, MD as PCP - Empaneled Provider  Pearlene Reyes Sharper, MD as Surgeon (Gastroenterology)      Reviewed and updated this visit:  Tobacco  Allergies  Meds  Problems  Med Hx  Surg Hx  Soc Hx  Fam Hx

## 2023-02-10 NOTE — Patient Instructions (Addendum)
 Pain management:  Meloxicam /Mobic  15 mg daily  Tylenol/acetaminophen at 1000 mg twice a day once in the morning once the evening before going to bed    Cholesterol medicine stroke prevention/  Lipitor/atorvastatin  40 mg daily  Aspirin 325 daily    Blood pressure medicine:  Amlodipine /Norvasc  10 mg daily    Call us  and update us  regarding who your brother uses for home health so that we can order home health for you.    You need a CT scan of your lungs to follow-up on lung nodules.  You need to follow-up with urology regarding slightly enlarged and hard prostate.  We need to find a local doctor in [place] so that you can get closer care.  Until then you need to set up your MyChart with your phone so that we can do virtual visits as needed  Call [employer] to find out local doctors that take your insurance.  We can forward your records to them once you establish there.  Referral to podiatry for onychomycosis of the toenails and yeast infection in between the toes.  Also for nail trimming.    You need to get your blood work done which we provided you a copy of.  You can likely get that done in [place], [place]  or locally before you leave.          Preventing Falls: Care Instructions  Injuries and health problems such as trouble walking or poor eyesight can increase your risk of falling. So can some medicines. But there are things you can do to help prevent falls. You can exercise to get stronger. You can also arrange your home to make it safer.    Talk to your doctor about the medicines you take. Ask if any of them increase the risk of falls and whether they can be changed or stopped.   Try to exercise regularly. It can help improve your strength and balance. This can help lower your risk of falling.         Practice fall safety and prevention.   Wear low-heeled shoes that fit well and give your feet good support. Talk to your doctor if you have foot problems that make this hard.  Carry a cellphone or wear a medical  alert device that you can use to call for help.  Use stepladders instead of chairs to reach high objects. Don't climb if you're at risk for falls. Ask for help, if needed.  Wear the correct eyeglasses, if you need them.        Make your home safer.   Remove rugs, cords, clutter, and furniture from walkways.  Keep your house well lit. Use night-lights in hallways and bathrooms.  Install and use sturdy handrails on stairways.  Wear nonskid footwear, even inside. Don't walk barefoot or in socks without shoes.        Be safe outside.   Use handrails, curb cuts, and ramps whenever possible.  Keep your hands free by using a shoulder bag or backpack.  Try to walk in well-lit areas. Watch out for uneven ground, changes in pavement, and debris.  Be careful in the winter. Walk on the grass or gravel when sidewalks are slippery. Use de-icer on steps and walkways. Add non-slip devices to shoes.    Put grab bars and nonskid mats in your shower or tub and near the toilet. Try to use a shower chair or bath bench when bathing.   Get into a tub or shower by putting  in your weaker leg first. Get out with your strong side first. Have a phone or medical alert device in the bathroom with you.   Where can you learn more?  Go to [employer] and enter G117 to learn more about Preventing Falls: Care Instructions.  Current as of: December 17, 2021  Content Version: 14.1   2006-2024 [employer].   Care instructions adapted under license by [facility]. If you have questions about a medical condition or this instruction, always ask your healthcare professional. [employer] disclaims any warranty or liability for your use of this information.           Learning About Emotional Support  When do you need emotional support?     You might find getting support from others helpful when you have a long-term health problem. Often people feel alone, confused, or scared when coping with an illness. But  you aren't alone. Other people are going through the same thing you are and know how you feel.  Talking with others about your feelings can help you feel better.  Your family and friends can give you support. So can your doctor, a support group, or a church. If you have a support network, you will not feel as alone. You will learn new ways to deal with your situation, and you may try harder to overcome it.  Where you can get support  Family and friends: They can help you cope by giving you comfort and encouragement.  Counseling: Professional counseling can help you cope with situations that interfere with your life and cause stress. Counseling can help you understand and deal with your illness.  Your doctor: Find a doctor you trust and feel comfortable with. Be open and honest about your fears and concerns. Your doctor can help you get the right medical treatments, including counseling.  Spiritual or religious groups: They can provide comfort and may be able to help you find counseling or other social support services.  Social groups: They can help you meet new people and get involved in activities you enjoy.  Community support groups: In a support group, you can talk to others who have dealt with the same problems or illness as you. You can encourage one another and learn ways to cope with tough emotions.  How can you find a support group?  Finding a support group that works for you may take time. There are many options. Some groups have a group leader who helps lead discussions or shares information. Others are less formal. Some meet in person, while others meet online.  Try using these resources to help you find the best support group for you.  Your doctor, health care team, or counselor.  People with the same health concern.  Your local church, mosque, synagogue, or other religious group.  A city, state, or national group that provides support for your health concern. Check your local library or community center  for a list of these groups. Or look for information online.  Your local community, friends, and family.  Supportive relationships  A supportive relationship includes emotional support such as love, trust, and understanding, as well as advice and concrete help, such as help managing your time.  Reach out to others  Family and friends can help you. Ask them to:  Listen to you and give you encouragement. This can keep you from feeling hopeless or alone.  Help with small daily tasks or with bigger problems. A helping hand can keep you from  feeling overwhelmed.  Help you manage a health problem. For example, ask them to go to doctor visits with you. Your loved ones can offer support by being involved in your medical care.  Respect your relationships  A good relationship is also a two-way street. You count on help from others, but they also count on you.  Know your friends' limits. You don't have to see or call your friends every day. If you are going through a rough patch, ask friends if you can contact them outside of the usual boundaries.  Don't always complain or talk about yourself. Know when it's time to stop talking and listen or just enjoy your friend's company.  Know that good friends can be a bad influence. For example, if a friend encourages you to drink when you know it will harm you, you may want to end the friendship.  Where can you learn more?  Go to [employer] and enter G092 to learn more about Learning About Emotional Support.  Current as of: November 24, 2021  Content Version: 14.1   2006-2024 [employer].   Care instructions adapted under license by [facility]. If you have questions about a medical condition or this instruction, always ask your healthcare professional. [employer] disclaims any warranty or liability for your use of this information.           Learning About Managing Acute Pain at Home  What is a pain management plan?     A pain  management plan spells out ways you can deal with your pain at home. You and your care team will create this plan before you leave the hospital. The plan may include:  The goals of your treatment. This may include how you can expect your pain and function to improve.  The treatments your doctor suggests for your pain. These may include medicines, physical therapy, or relaxation exercises.  Notes about how you and your care team will work together as you recover. Your team may include your doctor, a physical therapist, and an occupational therapist.  A review of your treatment goals for pain and function.  Your feelings about how you want to manage your pain are important. Be open and honest when you talk with your doctor. This will help ensure that you get a plan that is safe and that works best for you.  Why is it important to follow your plan?  After you have an injury or surgery, a certain amount of pain is common and normal. But you can manage your pain after you leave the hospital.  The best way to do that is to follow your pain management plan. This will help keep you comfortable and able to do the things you want to do. It can also speed your recovery and help reduce the risk of problems.  What are the side effects of pain medicines?  All pain medicines--like acetaminophen, nonsteroidal anti-inflammatory drugs, and opioids--have side effects, including allergic reaction, rash, and upset stomach. Common side effects of opioids also include constipation and being sleepy. More serious effects include needing larger doses over time, getting sick if you stop taking the drug, developing opioid use disorder, and death.  How do you manage pain after you leave the hospital?  After you leave the hospital, the best way to benefit from your treatment is to take good care of yourself. Here are some ways to do that.  Try nonmedical ways to relieve pain.   These ways include breathing exercises,  progressive muscle  relaxation, yoga, meditation, and massage.  Take your medicines or other treatments exactly as prescribed.   Let your doctor know if your pain isn't getting better.  Pace yourself.   It might be hard to take it easy when you get home. But even simple activities can increase pain at first. Follow your doctor's instructions about when you can be active again and any activities you should avoid. When you do start getting back to your regular activities, start slowly.  Arrange your home to help you recover.   Here are some ideas:  Remove throw rugs to prevent falling.  Sleep close to the bathroom, or have a commode near your bed.  Have pillows near you so you can sit or lie in a comfortable position.  Use tools that may help.   Some devices may help you do your daily activities and be more mobile. These devices include walking canes, crutches, grab bars, and reachers.  Try heat or cold.   Heat can soothe muscle pain and other aches. Cold can help with swelling.  Get support.   Friends and relatives often want to help but don't know what to do. Let them know what you need. It will make them happy and will help you.  How do you take opioids safely?  Opioids can help you manage pain. But their use can lead to problems, like opioid use disorder and even death. Because of this, it is best to get off them as soon as possible. As soon as you don't need them, talk to your doctor about how to safely stop taking them.  If you need to take opioids to manage pain, this advice can help you stay safe.  Take opioids exactly as directed.   Follow the directions carefully. It's easy to misuse opioids if you take a dose other than what's prescribed by your doctor. Even sharing them with someone they were not meant for is misuse.  Do not drive or operate machinery.   Opioids may affect your judgment and decision making. Talk with your doctor about when it is safe to drive.  Avoid alcohol, sleeping medicines, and muscle relaxers.   Opioids  can be dangerous if you take them with alcohol or with certain drugs like sleeping pills and muscle relaxers. The combination can decrease your breathing rate and lead to overdose or death. Make sure your doctor knows about all the other medicines you take, including over-the-counter medicines. Don't start any new medicines before you talk to your doctor or pharmacist.  Ask your doctor about a naloxone rescue kit.   It can help you--and even save your life--if you take too much of an opioid.  How do you safely store opioid pills and patches?  It's important to store opioids safely so that they aren't used by the wrong person. Your pain medicine is only for you to take. If someone else takes your medicine, it can seriously harm or kill them.  You can safely store your medicine. Follow these tips.  Store pills and patches up high and out of sight.  Keep them away from children, teens, visitors, and pets.  Return the container to the same place each time you take your medicine.  Try locking your opioid medicine in a cabinet.  Make sure the bottles are closed tightly.   If they have a safety cap, make sure that it's locked. Tighten the cap until you hear a click or can't twist it anymore.  Keep  track of how many pills or patches you have left.   You may want to keep track in a notebook.  Let the people who live with you know about your medicine.  Tell them that it is only for you to take.  If guests have opioid medicine with them, ask them to keep it safe.  How do you get rid of opioid pills and patches safely?  If you have opioid pills or patches that you aren't going to use, get rid of them right away. It's also important to get rid of used opioid patches. Do not keep your opioid medicine or opioid patches for later use.  When you get rid of these medicines safely, you take away any chance that a person or an animal might get sick from one of them.  Follow one of these steps. If you can't do the first step, then take  the next step.  Bring them to a [employer] (DEA)-authorized medicine take-back program or drop-off box.  Your local pharmacy or hospital may offer one of these.  Throw the medicines in the trash.   Take this step if you can't get to a take-back program or drop-off box and the medicine's instructions do not have specific disposal information.  Take the medicine out of its container.  Mix it with something that tastes bad, like cat litter or coffee grounds.  Place the mixture in a sealed plastic bag and put the bag in your household trash.  Flush them down the sink or toilet.  You can flush your medicine down the toilet or sink only if you can't go to a DEA-approved site or if your medicine's instructions specifically say to.  If you are throwing away a patch, first fold the sticky sides together.  You can also go to the FDA website to see a list of medicines that should be flushed.  Follow-up care is a key part of your treatment and safety. Be sure to make and go to all appointments, and call your doctor if you are having problems. It's also a good idea to know your test results and keep a list of the medicines you take.  Where can you learn more?  Go to [employer] and enter P175 to learn more about Learning About Managing Acute Pain at Home.  Current as of: December 10, 2021  Content Version: 14.1   2006-2024 [employer], [employer].   Care instructions adapted under license by [facility]. If you have questions about a medical condition or this instruction, always ask your healthcare professional. [employer] disclaims any warranty or liability for your use of this information.           Learning About Stress  What is stress?     Stress is your body's response to a hard situation. Your body can have a physical, emotional, or mental response. Stress is a fact of life for most people, and it affects everyone differently. What causes stress for you may not  be stressful for someone else.  A lot of things can cause stress. You may feel stress when you go on a job interview, take a test, or run a race. This kind of short-term stress is normal and even useful. It can help you if you need to work hard or react quickly. For example, stress can help you finish an important job on time.  Long-term stress is caused by ongoing stressful situations or events. Examples of long-term stress include long-term health problems,  ongoing problems at work, or conflicts in your family. Long-term stress can harm your health.  How does stress affect your health?  When you are stressed, your body responds as though you are in danger. It makes hormones that speed up your heart, make you breathe faster, and give you a burst of energy. This is called the fight-or-flight stress response. If the stress is over quickly, your body goes back to normal and no harm is done.  But if stress happens too often or lasts too long, it can have bad effects. Long-term stress can make you more likely to get sick, and it can make symptoms of some diseases worse. If you tense up when you are stressed, you may develop neck, shoulder, or low back pain. Stress is linked to high blood pressure and heart disease.  Stress also harms your emotional health. It can make you moody, tense, or depressed. Your relationships may suffer, and you may not do well at work or school.  What can you do to manage stress?  You can try these things to help manage stress:   Do something active. Exercise or activity can help reduce stress. Walking is a great way to get started. Even everyday activities such as housecleaning or yard work can help.  Try yoga or tai chi. These techniques combine exercise and meditation. You may need some training at first to learn them.  Do something you enjoy. For example, listen to music or go to a movie. Practice your hobby or do volunteer work.  Meditate. This can help you relax, because you are not  worrying about what happened before or what may happen in the future.  Do guided imagery. Imagine yourself in any setting that helps you feel calm. You can use online videos, books, or a teacher to guide you.  Do breathing exercises. For example:  From a standing position, bend forward from the waist with your knees slightly bent. Let your arms dangle close to the floor.  Breathe in slowly and deeply as you return to a standing position. Roll up slowly and lift your head last.  Hold your breath for just a few seconds in the standing position.  Breathe out slowly and bend forward from the waist.  Let your feelings out. Talk, laugh, cry, and express anger when you need to. Talking with supportive friends or family, a veterinary surgeon, or a faith leader about your feelings is a healthy way to relieve stress. Avoid discussing your feelings with people who make you feel worse.  Write. It may help to write about things that are bothering you. This helps you find out how much stress you feel and what is causing it. When you know this, you can find better ways to cope.  What can you do to prevent stress?  You might try some of these things to help prevent stress:  Manage your time. This helps you find time to do the things you want and need to do.  Get enough sleep. Your body recovers from the stresses of the day while you are sleeping.  Get support. Your family, friends, and community can make a difference in how you experience stress.  Limit your news feed. Avoid or limit time on social media or news that may make you feel stressed.  Do something active. Exercise or activity can help reduce stress. Walking is a great way to get started.  Where can you learn more?  Go to [employer] and enter N032 to learn  more about Learning About Stress.  Current as of: March 26, 2022  Content Version: 14.1   2006-2024 [employer].   Care instructions adapted under license by [facility]. If you have  questions about a medical condition or this instruction, always ask your healthcare professional. [employer] disclaims any warranty or liability for your use of this information.           Learning About Being Active as an Older Adult  Why is being active important as you get older?     Being active is one of the best things you can do for your health. And it's never too late to start. Being active--or getting active, if you aren't already--has definite benefits. It can:  Give you more energy,  Keep your mind sharp.  Improve balance to reduce your risk of falls.  Help you manage chronic illness with fewer medicines.  No matter how old you are, how fit you are, or what health problems you have, there is a form of activity that will work for you. And the more physical activity you can do, the better your overall health will be.  What kinds of activity can help you stay healthy?  Being more active will make your daily activities easier. Physical activity includes planned exercise and things you do in daily life. There are four types of activity:  Aerobic.  Doing aerobic activity makes your heart and lungs strong.  Includes walking, dancing, and gardening.  Aim for at least 2 hours spread throughout the week.  It improves your energy and can help you sleep better.  Muscle-strengthening.  This type of activity can help maintain muscle and strengthen bones.  Includes climbing stairs, using resistance bands, and lifting or carrying heavy loads.  Aim for at least twice a week.  It can help protect the knees and other joints.  Stretching.  Stretching gives you better range of motion in joints and muscles.  Includes upper arm stretches, calf stretches, and gentle yoga.  Aim for at least twice a week, preferably after your muscles are warmed up from other activities.  It can help you function better in daily life.  Balancing.  This helps you stay coordinated and have good posture.  Includes heel-to-toe walking,  tai chi, and certain types of yoga.  Aim for at least 3 days a week.  It can reduce your risk of falling.  Even if you have a hard time meeting the recommendations, it's better to be more active than less active. All activity done in each category counts toward your weekly total. You'd be surprised how daily things like carrying groceries, keeping up with grandchildren, and taking the stairs can add up.  What keeps you from being active?  If you've had a hard time being more active, you're not alone. Maybe you remember being able to do more. Or maybe you've never thought of yourself as being active. It's frustrating when you can't do the things you want. Being more active can help. What's holding you back?  Getting started.  Have a goal, but break it into easy tasks. Small steps build into big accomplishments.  Staying motivated.  If you feel like skipping your activity, remember your goal. Maybe you want to move better and stay independent. Every activity gets you one step closer.  Not feeling your best.  Start with 5 minutes of an activity you enjoy. Prove to yourself you can do it. As you get comfortable, increase your time.  You may not be where you want to be. But you're in the process of getting there. Everyone starts somewhere.  How can you find safe ways to stay active?  Talk with your doctor about any physical challenges you're facing. Make a plan with your doctor if you have a health problem or aren't sure how to get started with activity.  If you're already active, ask your doctor if there is anything you should change to stay safe as your body and health change.  If you tend to feel dizzy after you take medicine, avoid activity at that time. Try being active before you take your medicine. This will reduce your risk of falls.  If you plan to be active at home, make sure to clear your space before you get started. Remove things like TV cords, coffee tables, and throw rugs. It's safest to have plenty of  space to move freely.  The key to getting more active is to take it slow and steady. Try to improve only a little bit at a time. Pick just one area to improve on at first. And if an activity hurts, stop and talk to your doctor.  Where can you learn more?  Go to [employer] and enter P600 to learn more about Learning About Being Active as an Older Adult.  Current as of: November 05, 2021  Content Version: 14.1   2006-2024 [employer].   Care instructions adapted under license by [facility]. If you have questions about a medical condition or this instruction, always ask your healthcare professional. [employer] disclaims any warranty or liability for your use of this information.           Learning About Dental Care for Older Adults  Dental care for older adults: Overview  Dental care for older people is much the same as for younger adults. But older adults do have concerns that younger adults do not. Older adults may have problems with gum disease and decay on the roots of their teeth. They may need missing teeth replaced or broken fillings fixed. Or they may have dentures that need to be cared for. Some older adults may have trouble holding a toothbrush.  You can help remind the person you are caring for to brush and floss their teeth or to clean their dentures. In some cases, you may need to do the brushing and other dental care tasks. People who have trouble using their hands or who have dementia may need this extra help.  How can you help with dental care?  Normal dental care  To keep the teeth and gums healthy:  Brush the teeth with fluoride toothpaste twice a day--in the morning and at night--and floss at least once a day. Plaque can quickly build up on the teeth of older adults.  Watch for the signs of gum disease. These signs include gums that bleed after brushing or after eating hard foods, such as apples.  See a dentist regularly. Many experts recommend  checkups every 6 months.  Keep the dentist up to date on any new medications the person is taking.  Encourage a balanced diet that includes whole grains, vegetables, and fruits, and that is low in saturated fat and sodium.  Encourage the person you're caring for not to use tobacco products. They can affect dental and general health.  Many older adults have a fixed income and feel that they can't afford dental care. But most towns and cities have programs in which dentists help  older adults by lowering fees. Contact your area's public health offices or social services for information about dental care in your area.  Using a toothbrush  Older adults with arthritis sometimes have trouble brushing their teeth because they can't easily hold the toothbrush. Their hands and fingers may be stiff, painful, or weak. If this is the case, you can:  Offer an mining engineer toothbrush.  Enlarge the handle of a non-electric toothbrush by wrapping a sponge, an elastic bandage, or adhesive tape around it.  Push the toothbrush handle through a ball made of rubber or soft foam.  Make the handle longer and thicker by taping Popsicle sticks or tongue depressors to it.  You may also be able to buy special toothbrushes, toothpaste dispensers, and floss holders.  Your doctor may recommend a soft-bristle toothbrush if the person you care for bleeds easily. Bleeding can happen because of a health problem or from certain medicines.  A toothpaste for sensitive teeth may help if the person you care for has sensitive teeth.  How do you brush and floss someone's teeth?  If the person you are caring for has a hard time cleaning their teeth on their own, you may need to brush and floss their teeth for them. It may be easiest to have the person sit and face away from you, and to sit or stand behind them. That way you can steady their head against your arm as you reach around to floss and brush their teeth. Choose a place that has good lighting and is  comfortable for both of you.  Before you begin, gather your supplies. You will need gloves, floss, a toothbrush, and a container to hold water if you are not near a sink. Wash and dry your hands well and put on gloves. Start by flossing:  Gently work a piece of floss between each of the teeth toward the gums. A plastic flossing tool may make this easier, and they are available at most drugstores.  Curve the floss around each tooth into a U-shape and gently slide it under the gum line.  Move the floss firmly up and down several times to scrape off the plaque.  After you've finished flossing, throw away the used floss and begin brushing:  Wet the brush and apply toothpaste.  Place the brush at a 45-degree angle where the teeth meet the gums. Press firmly, and move the brush in small circles over the surface of the teeth.  Be careful not to brush too hard. Vigorous brushing can make the gums pull away from the teeth and can scratch the tooth enamel.  Brush all surfaces of the teeth, on the tongue side and on the cheek side. Pay special attention to the front teeth and all surfaces of the back teeth.  Brush chewing surfaces with short back-and-forth strokes.  After you've finished, help the person rinse the remaining toothpaste from their mouth.  Where can you learn more?  Go to [employer] and enter F944 to learn more about Learning About Dental Care for Older Adults.  Current as of: January 06, 2022  Content Version: 14.1   2006-2024 [employer].   Care instructions adapted under license by [facility]. If you have questions about a medical condition or this instruction, always ask your healthcare professional. [employer] disclaims any warranty or liability for your use of this information.           Learning About Vision Tests  What are vision tests?  The four most common vision tests are visual acuity tests, refraction, visual field tests, and color vision  tests.  Visual acuity (sharpness) tests  These tests are used:  To see if you need glasses or contact lenses.  To monitor an eye problem.  To check an eye injury.  Visual acuity tests are done as part of routine exams. You may also have this test when you get your driver's license or apply for some types of jobs.  Visual field tests  These tests are used:  To check for vision loss in any area of your range of vision.  To screen for certain eye diseases.  To look for nerve damage after a stroke, head injury, or other problem that could reduce blood flow to the brain.  Refraction and color tests  A refraction test is done to find the right prescription for glasses and contact lenses.  A color vision test is done to check for color blindness.  Color vision is often tested as part of a routine exam. You may also have this test when you apply for a job where recognizing different colors is important, such as truck driving, optician, dispensing, or the [employer].  How are vision tests done?  Visual acuity test   You cover one eye at a time.  You read aloud from a wall chart across the room.  You read aloud from a small card that you hold in your hand.  Refraction   You look into a special device.  The device puts lenses of different strengths in front of each eye to see how strong your glasses or contact lenses need to be.  Visual field tests   Your doctor may have you look through special machines.  Or your doctor may simply have you stare straight ahead while they move a finger into and out of your field of vision.  Color vision test   You look at pieces of printed test patterns in various colors. You say what number or symbol you see.  Your doctor may have you trace the number or symbol using a pointer.  How do these tests feel?  There is very little chance of having a problem from this test. If dilating drops are used for a vision test, they may make the eyes sting and cause a medicine taste in the mouth.  Follow-up care is a  key part of your treatment and safety. Be sure to make and go to all appointments, and call your doctor if you are having problems. It's also a good idea to know your test results and keep a list of the medicines you take.  Where can you learn more?  Go to [employer] and enter G551 to learn more about Learning About Vision Tests.  Current as of: November 05, 2021  Content Version: 14.1   2006-2024 [employer].   Care instructions adapted under license by [facility]. If you have questions about a medical condition or this instruction, always ask your healthcare professional. [employer] disclaims any warranty or liability for your use of this information.           Learning About Activities of Daily Living  What are activities of daily living?     Activities of daily living (ADLs) are the basic self-care tasks you do every day. These include eating, bathing, dressing, and moving around.  As you age, and if you have health problems, you may find that it's harder to do some of these tasks.  If so, your doctor can suggest ideas that may help.  To measure what kind of help you may need, your doctor will ask how well you are able to do ADLs. Let your doctor know if there are any tasks that you are having trouble doing. This is an important first step to getting help. And when you have the help you need, you can stay as independent as possible.  How will a doctor assess your ADLs?  Asking about ADLs is part of a routine health checkup your doctor will likely do as you age. Your health check might be done in a doctor's office, in your home, or at a hospital. The goal is to find out if you are having any problems that could make it hard to care for yourself or that make it unsafe for you to be on your own.  To measure your ADLs, your doctor will ask how hard it is for you to do routine tasks. Your doctor may also want to know if you have changed the way you do a task because  of a health problem. Your doctor may watch how you:  Walk back and forth.  Keep your balance while you stand or walk.  Move from sitting to standing or from a bed to a chair.  Button or unbutton a civil service fast streamer.  Remove and put on your shoes.  It's common to feel a little worried or anxious if you find you can't do all the things you used to be able to do. Talking with your doctor about ADLs is a way to make sure you're as safe as possible and able to care for yourself as well as you can. You may want to bring a caregiver, friend, or family member to your checkup. They can help you talk to your doctor.  Follow-up care is a key part of your treatment and safety. Be sure to make and go to all appointments, and call your doctor if you are having problems. It's also a good idea to know your test results and keep a list of the medicines you take.  Current as of: March 26, 2022  Content Version: 14.1   2006-2024 [employer].   Care instructions adapted under license by [facility]. If you have questions about a medical condition or this instruction, always ask your healthcare professional. [employer] disclaims any warranty or liability for your use of this information.           Advance Directives: Care Instructions  Overview  An advance directive is a legal way to state your wishes at the end of your life. It tells your family and your doctor what to do if you can't say what you want.  There are two main types of advance directives. You can change them any time your wishes change.  Living will.  This form tells your family and your doctor your wishes about life support and other treatment. The form is also called a declaration.  Medical power of attorney.  This form lets you name a person to make treatment decisions for you when you can't speak for yourself. This person is called a health care agent (health care proxy, health care surrogate). The form is also called a durable power of  attorney for health care.  If you do not have an advance directive, decisions about your medical care may be made by a family member, or by a doctor or a judge who doesn't know you.  It may help to think of an advance directive as a gift to the people who care for you. If you have one, they won't have to make tough decisions by themselves.  For more information, including forms for your state, see the [employer] website ([employer].cy).  Follow-up care is a key part of your treatment and safety. Be sure to make and go to all appointments, and call your doctor if you are having problems. It's also a good idea to know your test results and keep a list of the medicines you take.  What should you include in an advance directive?  Many states have a unique advance directive form. (It may ask you to address specific issues.) Or you might use a universal form that's approved by many states.  If your form doesn't tell you what to address, it may be hard to know what to include in your advance directive. Use the questions below to help you get started.  Who do you want to make decisions about your medical care if you are not able to?  What life-support measures do you want if you have a serious illness that gets worse over time or can't be cured?  What are you most afraid of that might happen? (Maybe you're afraid of having pain, losing your independence, or being kept alive by machines.)  Where would you prefer to die? (Your home? A hospital? A nursing home?)  Do you want to donate your organs when you die?  Do you want certain religious practices performed before you die?  When should you call for help?  Be sure to contact your doctor if you have any questions.  Where can you learn more?  Go to [employer] and enter R264 to learn more about Advance Directives: Care Instructions.  Current as of: April 18, 2022  Content Version: 14.1   2006-2024 [employer].   Care instructions adapted under license by [facility]. If you have questions about a medical condition or this instruction, always ask your healthcare professional. [employer] disclaims any warranty or liability for your use of this information.           Learning About Lung Cancer Screening  What is screening for lung cancer?     Lung cancer screening is a way to find some lung cancers early, before a person has any symptoms of the cancer.  Lung cancer screening may help those who have the highest risk for lung cancer--people age 88 and older who are or were heavy smokers. For most people, who aren't at increased risk, screening for lung cancer probably isn't helpful.  Screening won't prevent cancer. And it may not find all lung cancers. Lung cancer screening may lower the risk of dying from lung cancer in a small number of people.  How is it done?  Lung cancer screening is done with a low-dose CT (computed tomography) scan. A CT scan uses X-rays, or radiation, to make detailed pictures of your body. Experts recommend that screening be done in medical centers that focus on finding and treating lung cancer.  Who is screening recommended for?  Lung cancer screening is recommended for people age 56 and older who are or were heavy smokers. That means people with a smoking history of at least 20 pack years. A pack year is a way to measure how heavy a smoker you are or were.  To figure out your pack years, multiply how many packs a day on average (assuming 20 cigarettes  per pack) you have smoked by how many years you have smoked. For example:  If you smoked 1 pack a day for 20 years, that's 1 times 20. So you have a smoking history of 20 pack years.  If you smoked 2 packs a day for 10 years, that's 2 times 10. So you have a smoking history of 20 pack years.  Experts agree that screening is for people who have a high risk of lung cancer. But experts don't agree on what high risk means. Some  say people age 71 or older with at least a 20-pack-year smoking history are high risk. Others say it's people age 42 or older with a 30-pack-year history.  To see if you could benefit from screening, first find out if you are at high risk for lung cancer. Your doctor can help you decide your lung cancer risk.  What are the risks of screening?  CT screening for lung cancer isn't perfect. It can show an abnormal result when it turns out there wasn't any cancer. This is called a false-positive result. This means you may need more tests to make sure you don't have cancer. These tests can be harmful and cause a lot of worry.  These tests may include more CT scans and invasive testing like a lung biopsy. In a biopsy, the doctor takes a sample of tissue from inside your lung so it can be looked at under a microscope. A biopsy is the only way to tell if you have lung cancer. If the biopsy finds cancer, you and your doctor will have to decide how or whether to treat it.  Some lung cancers found on CT scans are harmless and would not have caused a problem if they had not been found through screening. But because doctors can't tell which ones will turn out to be harmless, most will be treated. This means that you may get treatment--including surgery, radiation, or chemotherapy--that you don't need.  There is a risk of damage to cells or tissue from being exposed to radiation, including the small amounts used in CTs, X-rays, and other medical tests. Over time, exposure to radiation may cause cancer and other health problems. But in most cases, the risk of getting cancer from being exposed to small amounts of radiation is low. It's not a reason to avoid these tests for most people.  What are the benefits of screening?  Your scan may be normal (negative).  For some people who are at higher risk, screening lowers the chance of dying of lung cancer. How much and how long you smoked helps to determine your risk level. Screening can  find some cancers early, when treatment may be more likely to work.  What happens after screening?  The results of your CT scan will be sent to your doctor. Someone from your care team will explain the results of your scan and answer any questions you may have. If you need any follow-up, he or she will help you understand what to do next.  After a lung cancer screening, you can go back to your usual activities right away.  A lung cancer screening test can't tell if you have lung cancer. If your results are positive, your doctor can't tell whether an abnormal finding is a harmless nodule, cancer, or something else without doing more tests.  What can you do to help prevent lung cancer?  Some lung cancers can't be prevented. But if you smoke, quitting smoking is the best step  you can take to prevent lung cancer. If you want to quit, your doctor can recommend medicines or other ways to help.  Follow-up care is a key part of your treatment and safety. Be sure to make and go to all appointments, and call your doctor if you are having problems. It's also a good idea to know your test results and keep a list of the medicines you take.  Where can you learn more?  Go to [employer] and enter Q940 to learn more about Learning About Lung Cancer Screening.  Current as of: March 27, 2022  Content Version: 14.1   2006-2024 [employer].   Care instructions adapted under license by [facility]. If you have questions about a medical condition or this instruction, always ask your healthcare professional. [employer] disclaims any warranty or liability for your use of this information.           A Healthy Heart: Care Instructions  Overview     Coronary artery disease, also called heart disease, occurs when a substance called plaque builds up in the vessels that supply oxygen-rich blood to your heart muscle. This can narrow the blood vessels and reduce blood flow. A heart attack  happens when blood flow is completely blocked. A high-fat diet, smoking, and other factors increase the risk of heart disease.  Your doctor has found that you have a chance of having heart disease. A heart-healthy lifestyle can help keep your heart healthy and prevent heart disease. This lifestyle includes eating healthy, being active, staying at a weight that's healthy for you, and not smoking or using tobacco. It also includes taking medicines as directed, managing other health conditions, and trying to get a healthy amount of sleep.  Follow-up care is a key part of your treatment and safety. Be sure to make and go to all appointments, and call your doctor if you are having problems. It's also a good idea to know your test results and keep a list of the medicines you take.  How can you care for yourself at home?  Diet    Use less salt when you cook and eat. This helps lower your blood pressure. Taste food before salting. Add only a little salt when you think you need it. With time, your taste buds will adjust to less salt.     Eat fewer snack items, fast foods, canned soups, and other high-salt, high-fat, processed foods.     Read food labels and try to avoid saturated and trans fats. They increase your risk of heart disease by raising cholesterol levels.     Limit the amount of solid fat--butter, margarine, and shortening--you eat. Use olive, peanut, or canola oil when you cook. Bake, broil, and steam foods instead of frying them.     Eat a variety of fruit and vegetables every day. Dark green, deep orange, red, or yellow fruits and vegetables are especially good for you. Examples include spinach, carrots, peaches, and berries.     Foods high in fiber can reduce your cholesterol and provide important vitamins and minerals. High-fiber foods include whole-grain cereals and breads, oatmeal, beans, brown rice, citrus fruits, and apples.     Eat lean proteins. Heart-healthy proteins include seafood, lean meats and  poultry, eggs, beans, peas, nuts, seeds, and soy products.     Limit drinks and foods with added sugar. These include candy, desserts, and soda pop.   Heart-healthy lifestyle    If your doctor recommends it, get more exercise.  For many people, walking is a good choice. Or you may want to swim, bike, or do other activities. Bit by bit, increase the time you're active every day. Try for at least 30 minutes on most days of the week.     Try to quit or cut back on using tobacco and other nicotine products. This includes smoking and vaping. If you need help quitting, talk to your doctor about stop-smoking programs and medicines. These can increase your chances of quitting for good. Quitting is one of the most important things you can do to protect your heart. It is never too late to quit. Try to avoid secondhand smoke too.     Stay at a weight that's healthy for you. Talk to your doctor if you need help losing weight.     Try to get 7 to 9 hours of sleep each night.     Limit alcohol to 2 drinks a day for men and 1 drink a day for women. Too much alcohol can cause health problems.     Manage other health problems such as diabetes, high blood pressure, and high cholesterol. If you think you may have a problem with alcohol or drug use, talk to your doctor.   Medicines    Take your medicines exactly as prescribed. Call your doctor if you think you are having a problem with your medicine.     If your doctor recommends aspirin, take the amount directed each day. Make sure you take aspirin and not another kind of pain reliever, such as acetaminophen (Tylenol).   When should you call for help?   Call 911 if you have symptoms of a heart attack. These may include:    Chest pain or pressure, or a strange feeling in the chest.     Sweating.     Shortness of breath.     Pain, pressure, or a strange feeling in the back, neck, jaw, or upper belly or in one or both shoulders or arms.     Lightheadedness or sudden weakness.     A fast  or irregular heartbeat.   After you call 911, the operator may tell you to chew 1 adult-strength or 2 to 4 low-dose aspirin. Wait for an ambulance. Do not try to drive yourself.  Watch closely for changes in your health, and be sure to contact your doctor if you have any problems.  Where can you learn more?  Go to [employer] and enter F075 to learn more about A Healthy Heart: Care Instructions.  Current as of: November 24, 2021  Content Version: 14.1   2006-2024 [employer].   Care instructions adapted under license by [facility]. If you have questions about a medical condition or this instruction, always ask your healthcare professional. [employer] disclaims any warranty or liability for your use of this information.      Personalized Preventive Plan for Michael Signorelli Sr. - 02/10/2023  Medicare offers a range of preventive health benefits. Some of the tests and screenings are paid in full while other may be subject to a deductible, co-insurance, and/or copay.    Some of these benefits include a comprehensive review of your medical history including lifestyle, illnesses that may run in your family, and various assessments and screenings as appropriate.    After reviewing your medical record and screening and assessments performed today your provider may have ordered immunizations, labs, imaging, and/or referrals for you.  A list of these orders (if applicable) as  well as your Preventive Care list are included within your After Visit Summary for your review.    Other Preventive Recommendations:    A preventive eye exam performed by an eye specialist is recommended every 1-2 years to screen for glaucoma; cataracts, macular degeneration, and other eye disorders.  A preventive dental visit is recommended every 6 months.  Try to get at least 150 minutes of exercise per week or 10,000 steps per day on a pedometer .  Order or download the FREE Exercise & Physical  Activity: Your Everyday Guide from [employer]. Call (470)[phone] or search [employer] online.  You need 1200-1500 mg of calcium  and 1000-2000 IU of vitamin D per day. It is possible to meet your calcium  requirement with diet alone, but a vitamin D supplement is usually necessary to meet this goal.  When exposed to the sun, use a sunscreen that protects against both UVA and UVB radiation with an SPF of 30 or greater. Reapply every 2 to 3 hours or after sweating, drying off with a towel, or swimming.  Always wear a seat belt when traveling in a car. Always wear a helmet when riding a bicycle or motorcycle.

## 2023-02-13 ENCOUNTER — Encounter: Attending: Internal Medicine | Primary: Internal Medicine

## 2023-03-04 NOTE — Assessment & Plan Note (Signed)
 Quit smoking in 2020.  Needs continued CT lung screening.

## 2023-03-04 NOTE — Assessment & Plan Note (Signed)
 Remains on aspirin and statin.  A1c within normal limits.  Continuing lifestyle modifications.

## 2023-03-04 NOTE — Assessment & Plan Note (Signed)
 Chronic stable microcytosis with normal hemoglobin.  Likely has thalassemia.

## 2023-03-04 NOTE — Assessment & Plan Note (Signed)
Referral to urology for further evaluation 

## 2023-03-04 NOTE — Assessment & Plan Note (Signed)
 Follow-up on lipids.  Tolerating Lipitor.

## 2023-03-04 NOTE — Assessment & Plan Note (Signed)
 Blood pressure remains borderline high continuing amlodipine 10 mg daily.  Consider adding ACE inhibitor if not improved in 3 months

## 2023-03-04 NOTE — Assessment & Plan Note (Signed)
 Needs follow-up CT lung screening.

## 2023-03-04 NOTE — Assessment & Plan Note (Signed)
 Continuing Mobic for pain management monitoring creatinine  Follow-up with Ortho as needed

## 2023-03-04 NOTE — Assessment & Plan Note (Signed)
Resume physical therapy

## 2023-03-11 NOTE — Progress Notes (Signed)
Spoke with pt and he states he does not know what labs are close to him to get them done. Was unable to find a lab close to him online. Advised him of urology referral and gave him the phone number to schedule. Says he will call back once he is able to find a lab to get them done.

## 2023-03-31 DIAGNOSIS — M542 Cervicalgia: Secondary | ICD-10-CM | POA: Diagnosis not present

## 2023-03-31 DIAGNOSIS — Z79891 Long term (current) use of opiate analgesic: Secondary | ICD-10-CM | POA: Diagnosis not present

## 2023-03-31 DIAGNOSIS — M4727 Other spondylosis with radiculopathy, lumbosacral region: Secondary | ICD-10-CM | POA: Diagnosis not present

## 2023-03-31 DIAGNOSIS — M961 Postlaminectomy syndrome, not elsewhere classified: Secondary | ICD-10-CM | POA: Diagnosis not present

## 2023-03-31 DIAGNOSIS — Z720 Tobacco use: Secondary | ICD-10-CM | POA: Diagnosis not present

## 2023-04-23 DIAGNOSIS — E1129 Type 2 diabetes mellitus with other diabetic kidney complication: Secondary | ICD-10-CM | POA: Diagnosis not present

## 2023-04-23 DIAGNOSIS — R809 Proteinuria, unspecified: Secondary | ICD-10-CM | POA: Diagnosis not present

## 2023-04-23 DIAGNOSIS — I499 Cardiac arrhythmia, unspecified: Secondary | ICD-10-CM | POA: Diagnosis not present

## 2023-04-23 DIAGNOSIS — I10 Essential (primary) hypertension: Secondary | ICD-10-CM | POA: Diagnosis not present

## 2023-04-23 DIAGNOSIS — J439 Emphysema, unspecified: Secondary | ICD-10-CM | POA: Diagnosis not present

## 2023-04-23 DIAGNOSIS — E785 Hyperlipidemia, unspecified: Secondary | ICD-10-CM | POA: Diagnosis not present

## 2023-05-14 ENCOUNTER — Encounter: Admit: 2023-05-14 | Payer: MEDICARE | Admitting: Internal Medicine | Primary: Internal Medicine

## 2023-05-14 DIAGNOSIS — E785 Hyperlipidemia, unspecified: Secondary | ICD-10-CM

## 2023-05-14 NOTE — Progress Notes (Signed)
 Chief Complaint  Chief Complaint   Patient presents with    Follow-up       HISTORY OF PRESENT ILLNESS  67 y.o. male presents today for follow-up on:   Patient presents for virtual visit.  Unable to connect via virtual visit platform with video and audio.

## 2023-05-14 NOTE — Telephone Encounter (Signed)
 Addressed.

## 2023-05-14 NOTE — Telephone Encounter (Signed)
 [employer]  05/13/23   4:55pm    RETURN CALL    Please call back    Sent to [place] [facility]

## 2023-05-14 NOTE — Assessment & Plan Note (Signed)
 Has follow-up scheduled with spine specialist.  Dr Willa Rough.   MRI results reviewed with the patient.

## 2023-05-14 NOTE — Assessment & Plan Note (Signed)
 Patient will call urology again to follow-up.  PSA was normal in the past.  Follow-up labs pending

## 2023-05-15 NOTE — Telephone Encounter (Signed)
Virtual visit follow up question  Mr.Endo had a virtual visit 05/14/23.  He is currently scheduled to come back 02/18/24 CPE.  Does he need a sooner follow up visit?

## 2023-05-19 NOTE — Telephone Encounter (Signed)
 Noted! Thank you

## 2023-06-05 DIAGNOSIS — Z139 Encounter for screening, unspecified: Secondary | ICD-10-CM | POA: Diagnosis not present

## 2023-06-05 DIAGNOSIS — Z9181 History of falling: Secondary | ICD-10-CM | POA: Diagnosis not present

## 2023-06-05 DIAGNOSIS — Z0000 Encounter for general adult medical examination without abnormal findings: Secondary | ICD-10-CM | POA: Diagnosis not present

## 2023-07-01 DIAGNOSIS — M542 Cervicalgia: Secondary | ICD-10-CM | POA: Diagnosis not present

## 2023-07-01 DIAGNOSIS — M961 Postlaminectomy syndrome, not elsewhere classified: Secondary | ICD-10-CM | POA: Diagnosis not present

## 2023-07-01 DIAGNOSIS — Z5181 Encounter for therapeutic drug level monitoring: Secondary | ICD-10-CM | POA: Diagnosis not present

## 2023-07-01 DIAGNOSIS — M4727 Other spondylosis with radiculopathy, lumbosacral region: Secondary | ICD-10-CM | POA: Diagnosis not present

## 2023-07-01 DIAGNOSIS — Z720 Tobacco use: Secondary | ICD-10-CM | POA: Diagnosis not present

## 2023-07-01 DIAGNOSIS — Z79891 Long term (current) use of opiate analgesic: Secondary | ICD-10-CM | POA: Diagnosis not present

## 2023-08-25 DIAGNOSIS — R809 Proteinuria, unspecified: Secondary | ICD-10-CM | POA: Diagnosis not present

## 2023-08-25 DIAGNOSIS — J439 Emphysema, unspecified: Secondary | ICD-10-CM | POA: Diagnosis not present

## 2023-08-25 DIAGNOSIS — E785 Hyperlipidemia, unspecified: Secondary | ICD-10-CM | POA: Diagnosis not present

## 2023-08-25 DIAGNOSIS — E1129 Type 2 diabetes mellitus with other diabetic kidney complication: Secondary | ICD-10-CM | POA: Diagnosis not present

## 2023-08-25 DIAGNOSIS — I10 Essential (primary) hypertension: Secondary | ICD-10-CM | POA: Diagnosis not present

## 2023-08-25 DIAGNOSIS — Z1159 Encounter for screening for other viral diseases: Secondary | ICD-10-CM | POA: Diagnosis not present

## 2023-09-12 ENCOUNTER — Encounter

## 2023-09-15 MED ORDER — MELOXICAM 15 MG PO TABS
15 | ORAL_TABLET | Freq: Every day | ORAL | 3 refills | Status: AC
Start: 2023-09-15 — End: ?

## 2023-09-15 MED ORDER — BACLOFEN 5 MG PO TABS
5 | ORAL_TABLET | Freq: Two times a day (BID) | ORAL | 0 refills | 17.50000 days | Status: DC
Start: 2023-09-15 — End: 2023-12-01

## 2023-09-15 MED ORDER — ATORVASTATIN CALCIUM 40 MG PO TABS
40 | ORAL_TABLET | Freq: Every day | ORAL | 3 refills | Status: AC
Start: 2023-09-15 — End: ?

## 2023-09-15 MED ORDER — AMLODIPINE BESYLATE 10 MG PO TABS
10 | ORAL_TABLET | Freq: Every day | ORAL | 3 refills | Status: AC
Start: 2023-09-15 — End: ?

## 2023-09-15 NOTE — Telephone Encounter (Signed)
 I sent in his prescriptions.  Let him know

## 2023-09-15 NOTE — Telephone Encounter (Signed)
Please load correctly

## 2023-09-25 DIAGNOSIS — H532 Diplopia: Secondary | ICD-10-CM | POA: Diagnosis not present

## 2023-09-25 DIAGNOSIS — E785 Hyperlipidemia, unspecified: Secondary | ICD-10-CM | POA: Diagnosis not present

## 2023-09-29 DIAGNOSIS — M4727 Other spondylosis with radiculopathy, lumbosacral region: Secondary | ICD-10-CM | POA: Diagnosis not present

## 2023-09-29 DIAGNOSIS — M542 Cervicalgia: Secondary | ICD-10-CM | POA: Diagnosis not present

## 2023-09-29 DIAGNOSIS — Z720 Tobacco use: Secondary | ICD-10-CM | POA: Diagnosis not present

## 2023-09-29 DIAGNOSIS — Z79891 Long term (current) use of opiate analgesic: Secondary | ICD-10-CM | POA: Diagnosis not present

## 2023-09-29 DIAGNOSIS — M961 Postlaminectomy syndrome, not elsewhere classified: Secondary | ICD-10-CM | POA: Diagnosis not present

## 2023-10-15 DIAGNOSIS — H532 Diplopia: Secondary | ICD-10-CM | POA: Diagnosis not present

## 2023-10-16 DIAGNOSIS — H532 Diplopia: Secondary | ICD-10-CM | POA: Diagnosis not present

## 2023-11-11 DIAGNOSIS — M961 Postlaminectomy syndrome, not elsewhere classified: Secondary | ICD-10-CM | POA: Diagnosis not present

## 2023-11-11 DIAGNOSIS — Z720 Tobacco use: Secondary | ICD-10-CM | POA: Diagnosis not present

## 2023-11-11 DIAGNOSIS — Z79891 Long term (current) use of opiate analgesic: Secondary | ICD-10-CM | POA: Diagnosis not present

## 2023-11-11 DIAGNOSIS — M4727 Other spondylosis with radiculopathy, lumbosacral region: Secondary | ICD-10-CM | POA: Diagnosis not present

## 2023-11-11 DIAGNOSIS — R030 Elevated blood-pressure reading, without diagnosis of hypertension: Secondary | ICD-10-CM | POA: Diagnosis not present

## 2023-12-01 ENCOUNTER — Encounter

## 2023-12-01 MED ORDER — BACLOFEN 5 MG PO TABS
5 | ORAL_TABLET | Freq: Two times a day (BID) | ORAL | 3 refills | 17.50000 days | Status: AC
Start: 2023-12-01 — End: ?

## 2023-12-08 DIAGNOSIS — Z5181 Encounter for therapeutic drug level monitoring: Secondary | ICD-10-CM | POA: Diagnosis not present

## 2023-12-08 DIAGNOSIS — M961 Postlaminectomy syndrome, not elsewhere classified: Secondary | ICD-10-CM | POA: Diagnosis not present

## 2023-12-08 DIAGNOSIS — L608 Other nail disorders: Secondary | ICD-10-CM | POA: Diagnosis not present

## 2023-12-08 DIAGNOSIS — M542 Cervicalgia: Secondary | ICD-10-CM | POA: Diagnosis not present

## 2023-12-08 DIAGNOSIS — M4727 Other spondylosis with radiculopathy, lumbosacral region: Secondary | ICD-10-CM | POA: Diagnosis not present

## 2023-12-08 DIAGNOSIS — Z720 Tobacco use: Secondary | ICD-10-CM | POA: Diagnosis not present

## 2023-12-08 DIAGNOSIS — Z79891 Long term (current) use of opiate analgesic: Secondary | ICD-10-CM | POA: Diagnosis not present

## 2023-12-23 DIAGNOSIS — L609 Nail disorder, unspecified: Secondary | ICD-10-CM | POA: Diagnosis not present

## 2023-12-23 DIAGNOSIS — E785 Hyperlipidemia, unspecified: Secondary | ICD-10-CM | POA: Diagnosis not present

## 2023-12-23 DIAGNOSIS — E1129 Type 2 diabetes mellitus with other diabetic kidney complication: Secondary | ICD-10-CM | POA: Diagnosis not present

## 2023-12-23 DIAGNOSIS — I10 Essential (primary) hypertension: Secondary | ICD-10-CM | POA: Diagnosis not present

## 2023-12-23 DIAGNOSIS — G8929 Other chronic pain: Secondary | ICD-10-CM | POA: Diagnosis not present

## 2023-12-23 DIAGNOSIS — H539 Unspecified visual disturbance: Secondary | ICD-10-CM | POA: Diagnosis not present

## 2023-12-23 DIAGNOSIS — R809 Proteinuria, unspecified: Secondary | ICD-10-CM | POA: Diagnosis not present

## 2023-12-23 DIAGNOSIS — M545 Low back pain, unspecified: Secondary | ICD-10-CM | POA: Diagnosis not present

## 2023-12-23 DIAGNOSIS — I69398 Other sequelae of cerebral infarction: Secondary | ICD-10-CM | POA: Diagnosis not present

## 2023-12-23 DIAGNOSIS — J439 Emphysema, unspecified: Secondary | ICD-10-CM | POA: Diagnosis not present

## 2024-01-06 ENCOUNTER — Ambulatory Visit: Admitting: Podiatry

## 2024-01-06 ENCOUNTER — Encounter: Payer: Self-pay | Admitting: Podiatry

## 2024-01-06 DIAGNOSIS — B351 Tinea unguium: Secondary | ICD-10-CM

## 2024-01-06 DIAGNOSIS — M79675 Pain in left toe(s): Secondary | ICD-10-CM | POA: Diagnosis not present

## 2024-01-06 DIAGNOSIS — M79674 Pain in right toe(s): Secondary | ICD-10-CM | POA: Diagnosis not present

## 2024-01-06 NOTE — Progress Notes (Signed)
  Subjective:  Patient ID: Henry JINNY Levora Chrystal., male    DOB: 04-Feb-1956,  MRN: [id number]  Chief Complaint  Patient presents with   RFC    RFC with out callous. Nails are fungal, thick and yellow, very elongated. Not diabetic and no anti coag.     68 y.o. male presents with the above complaint. History confirmed with patient. Patient presenting with pain related to dystrophic thickened elongated nails. Patient is unable to trim own nails related to nail dystrophy and/or mobility issues. Limited mobility due to low back pain Patient does not have a history of T2DM.   Objective:  Physical Exam: warm, good capillary refill nail exam onychomycosis of the toenails, onycholysis, and dystrophic nails with yellow discoloration, nail thickening and subungual debris DP pulses palpable, PT pulses palpable, and protective sensation intact Left Foot:  Pain with palpation of nails due to elongation and dystrophic growth.  Right Foot: Pain with palpation of nails due to elongation and dystrophic growth.   Assessment:   1. Pain due to onychomycosis of toenails of both feet      Plan:  Patient was evaluated and treated and all questions answered.  #Onychomycosis with pain  -Nails palliatively debrided as below. -Educated on self-care -Discussed oral medication he would like to defer this  Procedure: Nail Debridement Rationale: Pain Type of Debridement: manual, sharp debridement. Instrumentation: Nail nipper, rotary burr. Number of Nails: 10  Return in about 3 months (around 04/07/2024) for Routine Foot Care.         Ethan Saddler, DPM [facility] / [facility]

## 2024-02-18 ENCOUNTER — Encounter: Attending: Internal Medicine | Primary: Internal Medicine

## 2024-02-20 DIAGNOSIS — J209 Acute bronchitis, unspecified: Secondary | ICD-10-CM | POA: Diagnosis not present

## 2024-02-20 DIAGNOSIS — B370 Candidal stomatitis: Secondary | ICD-10-CM | POA: Diagnosis not present

## 2024-02-23 DIAGNOSIS — Z720 Tobacco use: Secondary | ICD-10-CM | POA: Diagnosis not present

## 2024-02-23 DIAGNOSIS — M961 Postlaminectomy syndrome, not elsewhere classified: Secondary | ICD-10-CM | POA: Diagnosis not present

## 2024-02-23 DIAGNOSIS — M4727 Other spondylosis with radiculopathy, lumbosacral region: Secondary | ICD-10-CM | POA: Diagnosis not present

## 2024-02-23 DIAGNOSIS — M542 Cervicalgia: Secondary | ICD-10-CM | POA: Diagnosis not present

## 2024-02-23 DIAGNOSIS — Z79891 Long term (current) use of opiate analgesic: Secondary | ICD-10-CM | POA: Diagnosis not present

## 2024-03-02 DIAGNOSIS — H43392 Other vitreous opacities, left eye: Secondary | ICD-10-CM | POA: Diagnosis not present

## 2024-03-02 DIAGNOSIS — H02831 Dermatochalasis of right upper eyelid: Secondary | ICD-10-CM | POA: Diagnosis not present

## 2024-03-02 DIAGNOSIS — E119 Type 2 diabetes mellitus without complications: Secondary | ICD-10-CM | POA: Diagnosis not present

## 2024-03-02 DIAGNOSIS — H3581 Retinal edema: Secondary | ICD-10-CM | POA: Diagnosis not present

## 2024-03-02 DIAGNOSIS — H5310 Unspecified subjective visual disturbances: Secondary | ICD-10-CM | POA: Diagnosis not present

## 2024-03-02 DIAGNOSIS — H2513 Age-related nuclear cataract, bilateral: Secondary | ICD-10-CM | POA: Diagnosis not present

## 2024-03-02 DIAGNOSIS — H02834 Dermatochalasis of left upper eyelid: Secondary | ICD-10-CM | POA: Diagnosis not present

## 2024-04-06 ENCOUNTER — Ambulatory Visit (INDEPENDENT_AMBULATORY_CARE_PROVIDER_SITE_OTHER): Payer: Self-pay | Admitting: Podiatry

## 2024-04-06 DIAGNOSIS — Z91199 Patient's noncompliance with other medical treatment and regimen due to unspecified reason: Secondary | ICD-10-CM

## 2024-04-09 NOTE — Progress Notes (Signed)
Patient did not show for scheduled appointment today.

## 2024-04-13 NOTE — Progress Notes (Signed)
 Henry JINNY Pines Sr.                                          MRN: [id number]   04/13/2024   The [facility] Quality Team Specialist reviewed this patient medical record for the purposes of chart review for care gap closure. The following were reviewed: chart review for care gap closure-glycemic status assessment.    [facility] Quality Team

## 2024-07-01 NOTE — Progress Notes (Signed)
 Daril JINNY Pines Sr.                                          MRN: [id number]   07/01/2024   The [facility] Quality Team Specialist reviewed this patient medical record for the purposes of chart review for care gap closure. The following were reviewed: chart review for care gap closure-glycemic status assessment.    [facility] Quality Team

## 2024-07-08 ENCOUNTER — Encounter
